# Patient Record
Sex: Female | Born: 1965 | Race: Black or African American | Hispanic: No | Marital: Single | Smoking: Never smoker
Health system: Southern US, Community
[De-identification: ages and names within clinical notes are randomized; demographics above are authoritative.]

## PROBLEM LIST (undated history)

## (undated) DIAGNOSIS — U071 COVID-19: Secondary | ICD-10-CM

## (undated) DIAGNOSIS — G4486 Cervicogenic headache: Secondary | ICD-10-CM

## (undated) DIAGNOSIS — K219 Gastro-esophageal reflux disease without esophagitis: Secondary | ICD-10-CM

## (undated) DIAGNOSIS — R600 Localized edema: Secondary | ICD-10-CM

## (undated) DIAGNOSIS — E049 Nontoxic goiter, unspecified: Secondary | ICD-10-CM

## (undated) DIAGNOSIS — E559 Vitamin D deficiency, unspecified: Secondary | ICD-10-CM

## (undated) DIAGNOSIS — D250 Submucous leiomyoma of uterus: Secondary | ICD-10-CM

## (undated) DIAGNOSIS — B001 Herpesviral vesicular dermatitis: Secondary | ICD-10-CM

## (undated) DIAGNOSIS — N8501 Benign endometrial hyperplasia: Secondary | ICD-10-CM

## (undated) DIAGNOSIS — E039 Hypothyroidism, unspecified: Secondary | ICD-10-CM

## (undated) DIAGNOSIS — R700 Elevated erythrocyte sedimentation rate: Secondary | ICD-10-CM

## (undated) DIAGNOSIS — N809 Endometriosis, unspecified: Secondary | ICD-10-CM

## (undated) DIAGNOSIS — R638 Other symptoms and signs concerning food and fluid intake: Secondary | ICD-10-CM

## (undated) DIAGNOSIS — N83201 Unspecified ovarian cyst, right side: Secondary | ICD-10-CM

## (undated) DIAGNOSIS — E7800 Pure hypercholesterolemia, unspecified: Secondary | ICD-10-CM

## (undated) DIAGNOSIS — M1990 Unspecified osteoarthritis, unspecified site: Secondary | ICD-10-CM

## (undated) DIAGNOSIS — I10 Essential (primary) hypertension: Secondary | ICD-10-CM

## (undated) DIAGNOSIS — G932 Benign intracranial hypertension: Secondary | ICD-10-CM

## (undated) DIAGNOSIS — E890 Postprocedural hypothyroidism: Secondary | ICD-10-CM

## (undated) DIAGNOSIS — J019 Acute sinusitis, unspecified: Secondary | ICD-10-CM

## (undated) DIAGNOSIS — Z8742 Personal history of other diseases of the female genital tract: Secondary | ICD-10-CM

## (undated) DIAGNOSIS — D649 Anemia, unspecified: Secondary | ICD-10-CM

## (undated) DIAGNOSIS — IMO0002 Reserved for concepts with insufficient information to code with codable children: Secondary | ICD-10-CM

## (undated) DIAGNOSIS — L709 Acne, unspecified: Secondary | ICD-10-CM

## (undated) HISTORY — DX: Pure hypercholesterolemia, unspecified: E78.00

## (undated) HISTORY — DX: Postprocedural hypothyroidism: E89.0

## (undated) HISTORY — DX: Cervicogenic headache: G44.86

## (undated) HISTORY — DX: Personal history of other diseases of the female genital tract: Z87.42

## (undated) HISTORY — DX: Gastro-esophageal reflux disease without esophagitis: K21.9

## (undated) HISTORY — DX: Acne, unspecified: L70.9

## (undated) HISTORY — DX: COVID-19: U07.1

## (undated) HISTORY — DX: Other symptoms and signs concerning food and fluid intake: R63.8

## (undated) HISTORY — DX: Unspecified osteoarthritis, unspecified site: M19.90

## (undated) HISTORY — PX: TUBAL LIGATION: SHX77

## (undated) HISTORY — DX: Morbid (severe) obesity due to excess calories: E66.01

## (undated) HISTORY — PX: THYROID SURGERY: SHX805

## (undated) HISTORY — DX: Benign endometrial hyperplasia: N85.01

## (undated) HISTORY — DX: Localized edema: R60.0

## (undated) HISTORY — PX: OVARY SURGERY: SHX727

## (undated) HISTORY — DX: Elevated erythrocyte sedimentation rate: R70.0

## (undated) HISTORY — DX: Acute sinusitis, unspecified: J01.90

## (undated) HISTORY — DX: Endometriosis, unspecified: N80.9

## (undated) HISTORY — DX: Nontoxic goiter, unspecified: E04.9

## (undated) HISTORY — DX: Reserved for concepts with insufficient information to code with codable children: IMO0002

## (undated) HISTORY — DX: Unspecified ovarian cyst, right side: N83.201

## (undated) HISTORY — DX: Vitamin D deficiency, unspecified: E55.9

## (undated) HISTORY — DX: Benign intracranial hypertension: G93.2

## (undated) HISTORY — DX: Herpesviral vesicular dermatitis: B00.1

## (undated) HISTORY — DX: Submucous leiomyoma of uterus: D25.0

---

## 2001-06-20 ENCOUNTER — Ambulatory Visit (HOSPITAL_COMMUNITY): Admission: RE | Admit: 2001-06-20 | Discharge: 2001-06-20 | Payer: Self-pay | Admitting: Family Medicine

## 2001-06-20 ENCOUNTER — Encounter: Payer: Self-pay | Admitting: Family Medicine

## 2001-07-16 ENCOUNTER — Encounter: Admission: RE | Admit: 2001-07-16 | Discharge: 2001-07-16 | Payer: Self-pay | Admitting: Surgery

## 2001-07-16 ENCOUNTER — Encounter: Payer: Self-pay | Admitting: Surgery

## 2001-11-02 ENCOUNTER — Encounter: Payer: Self-pay | Admitting: Surgery

## 2001-11-06 ENCOUNTER — Ambulatory Visit (HOSPITAL_COMMUNITY): Admission: RE | Admit: 2001-11-06 | Discharge: 2001-11-08 | Payer: Self-pay | Admitting: Surgery

## 2001-11-06 ENCOUNTER — Encounter (INDEPENDENT_AMBULATORY_CARE_PROVIDER_SITE_OTHER): Payer: Self-pay | Admitting: *Deleted

## 2003-02-21 DIAGNOSIS — R87619 Unspecified abnormal cytological findings in specimens from cervix uteri: Secondary | ICD-10-CM

## 2003-02-21 DIAGNOSIS — IMO0002 Reserved for concepts with insufficient information to code with codable children: Secondary | ICD-10-CM

## 2003-02-21 HISTORY — DX: Unspecified abnormal cytological findings in specimens from cervix uteri: R87.619

## 2003-02-21 HISTORY — DX: Reserved for concepts with insufficient information to code with codable children: IMO0002

## 2003-04-07 ENCOUNTER — Other Ambulatory Visit: Admission: RE | Admit: 2003-04-07 | Discharge: 2003-04-07 | Payer: Self-pay | Admitting: Family Medicine

## 2003-04-09 ENCOUNTER — Ambulatory Visit (HOSPITAL_COMMUNITY): Admission: RE | Admit: 2003-04-09 | Discharge: 2003-04-09 | Payer: Self-pay | Admitting: Family Medicine

## 2003-11-17 ENCOUNTER — Ambulatory Visit (HOSPITAL_COMMUNITY): Admission: RE | Admit: 2003-11-17 | Discharge: 2003-11-17 | Payer: Self-pay | Admitting: Family Medicine

## 2004-02-21 DIAGNOSIS — D250 Submucous leiomyoma of uterus: Secondary | ICD-10-CM

## 2004-02-21 DIAGNOSIS — Z8742 Personal history of other diseases of the female genital tract: Secondary | ICD-10-CM

## 2004-02-21 DIAGNOSIS — N809 Endometriosis, unspecified: Secondary | ICD-10-CM

## 2004-02-21 DIAGNOSIS — N8501 Benign endometrial hyperplasia: Secondary | ICD-10-CM

## 2004-02-21 HISTORY — DX: Benign endometrial hyperplasia: N85.01

## 2004-02-21 HISTORY — DX: Submucous leiomyoma of uterus: D25.0

## 2004-02-21 HISTORY — DX: Personal history of other diseases of the female genital tract: Z87.42

## 2004-02-21 HISTORY — DX: Endometriosis, unspecified: N80.9

## 2004-03-04 DIAGNOSIS — N803 Endometriosis of pelvic peritoneum, unspecified: Secondary | ICD-10-CM | POA: Insufficient documentation

## 2004-03-19 ENCOUNTER — Encounter (INDEPENDENT_AMBULATORY_CARE_PROVIDER_SITE_OTHER): Payer: Self-pay | Admitting: *Deleted

## 2004-03-19 ENCOUNTER — Inpatient Hospital Stay (HOSPITAL_COMMUNITY): Admission: RE | Admit: 2004-03-19 | Discharge: 2004-03-21 | Payer: Self-pay | Admitting: Obstetrics and Gynecology

## 2004-03-19 DIAGNOSIS — N83201 Unspecified ovarian cyst, right side: Secondary | ICD-10-CM

## 2004-03-19 HISTORY — PX: DILATION AND CURETTAGE OF UTERUS: SHX78

## 2004-03-19 HISTORY — PX: OTHER SURGICAL HISTORY: SHX169

## 2004-03-19 HISTORY — DX: Unspecified ovarian cyst, right side: N83.201

## 2004-07-12 ENCOUNTER — Other Ambulatory Visit: Admission: RE | Admit: 2004-07-12 | Discharge: 2004-07-12 | Payer: Self-pay | Admitting: Obstetrics and Gynecology

## 2009-02-04 ENCOUNTER — Emergency Department (HOSPITAL_COMMUNITY): Admission: EM | Admit: 2009-02-04 | Discharge: 2009-02-04 | Payer: Self-pay | Admitting: Emergency Medicine

## 2010-06-13 ENCOUNTER — Encounter: Payer: Self-pay | Admitting: Internal Medicine

## 2010-08-27 LAB — CBC
HCT: 33 % — ABNORMAL LOW (ref 36.0–46.0)
MCHC: 32.1 g/dL (ref 30.0–36.0)
MCV: 78.2 fL (ref 78.0–100.0)
Platelets: 444 10*3/uL — ABNORMAL HIGH (ref 150–400)
WBC: 6.8 10*3/uL (ref 4.0–10.5)

## 2010-08-27 LAB — URINALYSIS, ROUTINE W REFLEX MICROSCOPIC
Glucose, UA: NEGATIVE mg/dL
Protein, ur: NEGATIVE mg/dL
Urobilinogen, UA: 1 mg/dL (ref 0.0–1.0)

## 2010-08-27 LAB — POCT I-STAT, CHEM 8
Chloride: 102 mEq/L (ref 96–112)
Creatinine, Ser: 1.1 mg/dL (ref 0.4–1.2)
Glucose, Bld: 88 mg/dL (ref 70–99)
Hemoglobin: 12.2 g/dL (ref 12.0–15.0)
Potassium: 3.7 mEq/L (ref 3.5–5.1)

## 2010-08-27 LAB — DIFFERENTIAL
Eosinophils Absolute: 0.5 10*3/uL (ref 0.0–0.7)
Eosinophils Relative: 8 % — ABNORMAL HIGH (ref 0–5)
Lymphs Abs: 1.6 10*3/uL (ref 0.7–4.0)
Monocytes Absolute: 0.6 10*3/uL (ref 0.1–1.0)

## 2010-08-27 LAB — WET PREP, GENITAL
Clue Cells Wet Prep HPF POC: NONE SEEN
Trich, Wet Prep: NONE SEEN
WBC, Wet Prep HPF POC: NONE SEEN

## 2010-08-27 LAB — URINE MICROSCOPIC-ADD ON

## 2010-08-27 LAB — URINE CULTURE: Culture: NO GROWTH

## 2010-09-16 ENCOUNTER — Other Ambulatory Visit: Payer: Self-pay | Admitting: Family Medicine

## 2010-09-16 ENCOUNTER — Ambulatory Visit
Admission: RE | Admit: 2010-09-16 | Discharge: 2010-09-16 | Disposition: A | Discharge status: Home / Self Care | Payer: PRIVATE HEALTH INSURANCE | Source: Ambulatory Visit | Attending: Family Medicine | Admitting: Family Medicine

## 2010-09-16 DIAGNOSIS — N920 Excessive and frequent menstruation with regular cycle: Secondary | ICD-10-CM

## 2010-10-08 NOTE — Discharge Summary (Signed)
[place]  Patient:    Teresa Kent, BETTENHAUSEN Visit Number: [id number] MRN: [id number]          Service Type: DSU Location: [phone] Attending Physician:  Bonnetta Barry Dictated by:   Velora Heckler, M.D. Admit Date:  11/06/2001 Discharge Date: 11/08/2001   CC:         Stacie Acres. Cliffton Asters, M.D.   Discharge Summary  REASON FOR ADMISSION:  Thyroid goiter.  BRIEF HISTORY:  The patient is a 45 year old black female who has had a developing thyroid goiter over the past several years.  She has developed intermittent hoarseness and compressive symptoms.  She now comes to surgery for thyroidectomy.  HOSPITAL COURSE:  The patient was admitted on the morning of November 06, 2001. She was taken directly to the operating room where she underwent total thyroidectomy for a very large thyroid goiter.  Postoperatively, the patient did well.  She developed mild hypocalcemia requiring oral replenishment. A Jackson-Pratt drain was left in place due to the large size of the goiter. This was removed on the second postoperative day.  The patient was prepared for discharge on the second postoperative day.  PLAN:  The patient is discharged home today, November 08, 2001, in good condition, tolerating a regular diet, and ambulating independently.  She will be seen back in my office at [facility] in two weeks.  DISCHARGE MEDICATIONS: 1. Vicodin as needed for pain. 2. Iron sulfate 325 mg twice daily. 3. Synthroid 0.125 mg daily. 4. Calcium carbonate 1000 mg three times daily.  FINAL DIAGNOSES:  Thyroid goiter with compressive symptoms.  CONDITION ON DISCHARGE:  Improved. Dictated by:   Velora Heckler, M.D. Attending Physician:  Bonnetta Barry DD:  11/08/01 TD:  11/09/01 Job: [id number]

## 2010-10-08 NOTE — H&P (Signed)
Teresa Kent, Teresa Kent            ACCOUNT NO.:  [id number]   MEDICAL RECORD NO.:  [id number]          PATIENT TYPE:  INP   LOCATION:  9399                          FACILITY:  [facility]   PHYSICIAN:  Osborn Coho, M.D.   DATE OF BIRTH:  06/15/1965   DATE OF ADMISSION:  03/19/2004  DATE OF DISCHARGE:                                HISTORY & PHYSICAL   CHIEF COMPLAINT:  Heavy menstrual cycle x at least one year and persistent  right ovarian cyst since November 2004.   HISTORY:  Teresa Kent is a 45 year old, gravida 1, para 1-0-0-1 with last  menstrual period October with menorrhagia for greater than one year and  persistent right ovarian cyst since November of 2004.   PAST OB HISTORY:  Normal spontaneous vaginal delivery fullterm x1.   PAST GYN HISTORY:  History of regular menses, no other history of gonorrhea  or chlamydia and reports a positive history of abnormal Pap with her last  Pap in November of 2004 showing ASCUS and negative for high risk HPV.  Diagnosed with cyst and fibroid versus polyp about one year ago.   PAST MEDICAL HISTORY:  1.  Hypertension.  2.  Hypothyroidism status post thyroidectomy.   PAST SURGICAL HISTORY:  Thyroidectomy and bilateral tubal ligation.   MEDICINES:  1.  Synthroid 200 mcg q.d.  2.  Maxzide 25 mg q.d.   ALLERGIES:  No known drug allergies.   SOCIAL HISTORY:  Denies cigarette use, alcohol use, drug use and lives with  her son.   FAMILY HISTORY:  Hypertension, diabetes, and no GYN cancers.   REVIEW OF SYMPTOMS:  Denies chest pain, shortness of breath, fever, chills,  nausea, vomiting, diarrhea or GU problems.  Does report constipation when  started Synthroid.   PHYSICAL EXAMINATION:  HEENT:  Within normal limits.  Status post thyroid  surgery.  HEART:  Rate and rhythm are regular.  CHEST:  Clear to auscultation bilaterally.  BREASTS:  Deferred.  BACK:  No CVA tenderness.  ABDOMEN:  Obese, soft, nontender.  EXTREMITIES:  Within normal  limits.  PELVIC:  External genitalia within normal limits, vagina within normal  limits. Difficult to palpate uterus or bilateral adnexa secondary to body  habitus.   IMAGING:  Ultrasound revealing persistent 3 cm right ovarian cyst and  probable 1 cm fibroid.   ASSESSMENT:  Teresa Kent is a 45 year old, para 1 with menorrhagia most  likely secondary to submucosal fibroid. She also has persistent right  ovarian cyst.  Options were discussed with the patient. The patient wanted  to undergo hysteroscopy for removal of the submucosal fibroid and  laparoscopy for removal of the right ovarian cyst.  The risks, benefits, and  alternatives were discussed with the patient including but not limited to  bleeding, infection, injury, and possible laparotomy with possible  oophorectomy.  Consent signed and witnessed and preop labs done.     Ange   AR/MEDQ  D:  03/19/2004  T:  03/19/2004  Job:  [id number]

## 2010-10-08 NOTE — Op Note (Signed)
NAMEJAANAI, SALEMI            ACCOUNT NO.:  [id number]   MEDICAL RECORD NO.:  [id number]          PATIENT TYPE:  INP   LOCATION:  9310                          [facility]ACILITY[facility]  [facility]   PHYSICIAN:  Osborn Coho, M.D.   DATE OF BIRTH:  06-Jan-1966   DATE OF PROCEDURE:  03/19/2004  DATE OF DISCHARGE:                                 OPERATIVE REPORT   PREOPERATIVE DIAGNOSES:  1.  Menorrhagia.  2.  Submucosal fibroid.  3.  Persistent right ovarian cyst.   POSTOPERATIVE DIAGNOSES:  1.  Menorrhagia.  2.  Submucosal fibroid.  3.  Persistent right ovarian cyst.  4.  Probable endometrioma.   PROCEDURE:  1.  Hysteroscopy.  2.  Resection of fibroid.  3.  D&C.  4.  Diagnostic laparoscopy.  5.  Exploratory laparotomy.  6.  Right salpingo-oophorectomy.  7.  Lysis of adhesions.   ANESTHESIA:  General.   ATTENDING PHYSICIAN:  Osborn Coho, M.D.   ASSISTANT:  Naima A. Dillard, M.D.   FLUIDS:  2900 mL.   ESTIMATED BLOOD LOSS:  300 mL.   URINE OUTPUT:  300 mL.   COMPLICATIONS:  None.   FINDINGS:  An approximately 1 cm submucosal fibroid and possibly a polyp as  well. An approximately 3 cm right ovarian cyst probable endometrioma.  Many  very dense adhesions of the left and right ovary to each other and to the  posterior wall of the uterus as well as to bowel.   PATHOLOGY:  Right ovary and fallopian tube.  Endometrial curettings and  portions of resected fibroid.   DESCRIPTION OF PROCEDURE:  The patient was taken to the operating room after  the risks, benefits, and alternatives were discussed with the patient. The  patient verbalized an understanding and consent signed and witnessed. The  patient was prepped and draped in the normal sterile fashion after being  placed under general anesthesia.  A speculum was placed in the patient's  vagina and the cervix dilated for the passage of the diagnostic  hysteroscope. The diagnostic hysteroscope was introduced and an  approximately 1  cm submucosal fibroid noted as well as possibly a polyp.  The cervix was then dilated for passage of the resectoscope. The  resectoscope was introduced and submucosal fibroid resected as well as  polyp.  Curettage was performed.  Prior to doing the diagnostic  hysteroscope, the uterus was sounded to approximately 9-10 cm.  After  curettage, the instruments were removed and a Hulka placed for intrauterine  manipulation for the laparoscopic portion of the case. The tenaculum site  were hemostatic. After regowning and gloving, attention was turned to the  abdomen where a 10 mm incision was made at the umbilicus. A Veress needle  was introduced into the intraabdominal cavity and pneumoperitoneum achieved.  A 10 mm trocar was then advanced into the intraabdominal cavity and some  omental adhesions were noted at the sites of the umbilical incision. No  bowel was noted to be adherent to the abdominal wall in addition to that  omentum.  The patient was placed in Trendelenburg and attention was turned  to the suprapubic  region where a 5 mm incision was made. Prior to making the  incision at the umbilicus or at the suprapubic region, Marcaine was  injected.  The 5 mm incision was made suprapubically and the trocar advanced  under direct visualization.  A blunt probe was used for manipulation of the  bowel which was noted to be densely adherent to the bilateral ovaries and  posterior wall of the uterus. The decision was made to convert to laparotomy  at this time.  Antibiotics were ordered in the form of 2 g of cefoxitin. A  Pfannenstiel skin incision was made and carried down to the underlying layer  of fascia with the Bovie. The fascia was excised bilaterally in the midline  with the Bovie and the incision extended bilaterally with the Bovie after  being tinted up with large Kelly's.  Kocher clamps were placed on the  superior aspect of the fascial incision and the rectus muscle excised from  the  fascia.  The same was done on the inferior aspect of the fascial  incision. The muscle was separated in the midline and the peritoneum was  entered bluntly in patient.  The peritoneal incision was extended with  Metzenbaum scissors.  The bowel was packed away and the [facility] was placed  for self retraction. The extender was used to hold the bowel.  A bladder  blade was also placed. Exposure was very difficult as the pelvis was deep.  Adhesions of the bowel to the bilateral ovaries and posterior uterine wall  were taken down sharply with the Metzenbaum scissors as well as bluntly.  Once the right ovary was freed, the IP was identified and the ureter on that  side was identified as well and noted to peristalse normally. The cyst was  punctured and chocolate fluid was released suggestive of an endometrioma.  The IP was then clamped with a large Kelly. The round ligament was clamped  on the right side and stitched.  The Bovie was used to cauterize the round  ligament and a window was then created for clamping the uteroovarian  pedicle. The uteroovarian pedicle was then clamped with two large Kelly's,  excised and ligated with #0 Vicryl and then suture ligated with #0 Vicryl  with a second stitch.  The large Tresa Endo was then used to clamp the  infundibulopelvic ligament after noting the ureter was away from this area  and the ovary was excised and sent off to pathology.  The pedicle was then  tied with a free tie of #0 Vicryl and suture ligated with #0 Vicryl.  The  remaining portion of the right fallopian tube was also clamped, excised and  ligated using a free tie of #0 Vicryl and then suture ligated.  Hemostasis  of the pedicle was noted. The intraabdominal cavity was copiously irrigated  and all pedicles were noted to be hemostatic. Intercede was placed in the  posterior cul-de-sac. Surgicel was placed on the right mesosalpinx.  The left ovary was still slightly adherent to the posterior  uterine wall but  appeared otherwise within normal limits.  All sponges were removed. The 10  mm umbilical incision was repaired using a pursestring suture of #0 Vicryl.  3-0 Monocryl was placed on the skin.  The peritoneum was then closed with 2-  0 chromic in a running fashion.  The fascia was repaired with #0 Vicryl in a  running fashion. The subcutaneous tissue was copiously irrigated and the  subcutaneous tissue was reapproximated  with  2-0 plain using three interrupted's. The skin was closed with 3-0  Monocryl using a subcuticular stitch. Sponge, lap and needle count was  correct. The patient tolerated the procedure well and was returned to the  recovery room in good condition.     Ange   AR/MEDQ  D:  03/19/2004  T:  03/19/2004  Job:  [id number]

## 2010-10-08 NOTE — Op Note (Signed)
. [facility]  Patient:    Teresa Kent, Teresa Kent Visit Number: [id number] MRN: [id number]          Service Type: DSU Location: [phone] Attending Physician:  Bonnetta Barry Dictated by:   Velora Heckler, M.D. Proc. Date: 11/06/01 Admit Date:  11/06/2001   CC:         Stacie Acres. Cliffton Asters, M.D.   Operative Report  PREOPERATIVE DIAGNOSIS:  Thyroid goiter.  POSTOPERATIVE DIAGNOSIS:  Thyroid goiter.  PROCEDURE:  Total thyroidectomy.  SURGEON:  Velora Heckler, M.D.  ASSISTANT:  Adolph Pollack, M.D.  ANESTHESIA:  Judie Petit, M.D. for general anesthesia.  ESTIMATED BLOOD LOSS:  750 cc.  PREPARATION:  Betadine.  COMPLICATIONS:  None.  INDICATIONS:  The patient is a 46 year old black female who presents with longstanding thyroid goiter.  This has been present for many years, but has become markedly increased in size and more physically prominent over the past two years.  She does note intermittent hoarseness.  She has frequent dysphasia. She does have dyspnea.  She is unable to sleep lying on her back. She gives a history of tightness in the throat.  The patient underwent ultrasound and CT scan of the neck.  She now comes to surgery for thyroidectomy.  DESCRIPTION OF PROCEDURE:  The procedure is done in OR #17 at the [facility].  The patient is brought to the operating room and placed in the supine position on the operating table.  Following administration of general anesthesia, the patient is prepped and draped in the usual strict aseptic fashion.  After ascertaining that an adequate level of anesthesia had been obtained, a Kocher incision was made with a #10 blade.  Dissection was carried down through the subcutaneous tissues and platysma.  Hemostasis is obtained with electrocautery.  Skin flaps are developed cephalad and caudad from the thyroid notch to the sternal notch.  A [place]  self-retaining retractory is placed for exposure.  Dissection is begun in the midline.  Strap muscles are incised and reflected laterally.  The gland is exceedingly large measuring probably 20 cm in transverse dimension.  There is considerable anterior extension of the gland making dissection laterally and posteriorly quite difficult.  Due to the overall size of the gland, this case was extended by at least one hour in operative time.  Also the increased level of difficulty required two surgeons and the assistance of a PA student as well as two scrub techs.  Significant increased blood loss was due to venous hypertension within the gland.  Overall the difficulty of this case was markedly increased due to the size of the goiter.  Dissection was begun on the left side by reflecting the strap muscles laterlaly.  Using blunt dissection, the thyroid lobe was mobilized.  Large venous tributaries were divided between medium ligaclips and also ligated in continuity with 2-0 silk ties.  Dissection was carried cephalad.  Strap muscles were reflected laterally and superiorly.  The superior pole of the gland was quite large and extended posteriorly behind the larynx.  With blunt dissection it was slowly mobilized and superior pole vessels were visualized. Vessels were ligated in continuity with 2-0 silk ties and doubly secured with medium ligaclips.  Vessels were then divided.  A second group of vessels from the posterior aspect of the superior pole were also ligated in continuity and divided after application of ligaclips.  The gland was rolled anteriorly. Middle thyroid vein was ligated in  continuity with 3-0 silk ties and divided. The gland was rolled further medially.  Care was taken to preserve the area of the recurrent laryngeal nerve and parathyroid glands, however, these structures were not positively identified on the left side of the neck. The inferior pole venous tributaries were ligated  with 2-0 silk ties and divided. Disruption of the inferior pole capsule lead to significant bleeding from venous hypertension within the gland.  This was controlled mainly by direct pressure and with 2-0 silk suture ligatures.  The gland was rolled further medially.  The tubercle of Zucker candle was identified.  It was intimately approximated with the branches of the inferior thyroid artery, the recurrent laryngeal nerve, and suspected parathyroid tissue.  Therefore, the small remnant of thyroid tissue was left in place on the left side by dividing across the thyroid parenchyma with the electrocautery so as to prevent injury to the recurrent laryngeal nerve.  Gland is mobilized anteriorly up and onto the surface of the trachea.  Tributaries coming in to the superior aspect of the thyroid isthmus are ligated between small ligaclips and divided.  Next, we proceeded with the right side.  The right lobe of the gland was actually larger than the left.  The strap muscles were again reflected laterally. Using blunt dissection to mobilize the lobe.  Inferior pole venous tributaries were again divided by ligating in continuity with 2-0 silk ties and using medium ligaclips.  After rolling the gland anteriorly, the superior pole was mobilized.  Again vessels were ligated in continuity with 2-0 silk ties and medium ligaclips and divided.  The parathyroid glands on the right side were both positively identified as was the recurrent laryngeal nerve.  Branches of the inferior thyroid artery area divided between small ligaclips.  The main trunk of the inferior thyroid artery was ligated just above the recurrent laryngeal nerve with a 2-0 silk figure-of-eight suture ligature.  The thyroid gland and tubercle of Zucker candle were then carefully excised off the anterior surface of the trachea.  The entire gland is excised and the left superior pole marked with a suture for orientation purposes.  The gland  is markedly enlarged, although, appears homogeneous and was submitted fresh to  pathology for review.  The neck was irrigated copiously with warm saline on both sides.  Hemostasis was obtained with the electrocautery as well as small ligaclips.  Good hemostasis was noted.  Surgicel was placed over the area of the recurrent laryngeal nerves bilaterally.  A 7 mm Jackson-Pratt drain is brought in from the left lateral stab wound and placed into the thyroid bed. Strap muscles are reapproximated in the midline with interrupted 3-0 Vicryl sutures.  Drain is secured to the skin with a 4-0 nylon suture.  Platysma is reapproximated with interrupted 3-0 Vicryl sutures.  Skin edges are reapproximated with widely spaced stainless steel staples and interspaced half inch Steri-Strips and Benzoin.  Sterile gauze dressings are applied.  Drain is placed to bulb suction.  The patient is awakened from anesthesia and brought to the recovery room in stable condition.  The patient tolerated the procedure well. Dictated by:   Velora Heckler, M.D. Attending Physician:  Bonnetta Barry DD:  11/06/01 TD:  11/07/01 Job: [id number]

## 2010-10-08 NOTE — Discharge Summary (Signed)
Teresa Teresa Kent Teresa Kent, Teresa Teresa Kent Teresa Kent Teresa Kent            ACCOUNT NO.:  [id number]   MEDICAL RECORD NO.:  [id number]          PATIENT TYPE:  INP   LOCATION:  9310                          FACILITY:  [facility]   PHYSICIAN:  Osborn Coho, M.D.   DATE OF BIRTH:  [birth year]   DATE OF ADMISSION:  03/19/2004  DATE OF DISCHARGE:  03/21/2004                                 DISCHARGE SUMMARY   DISCHARGE DIAGNOSES:  1.  Menorrhagia.  2.  Submucosal fibroid.  3.  Persistent right ovarian cyst.  4.  Endometriosis.  5.  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   OPERATION:  On the date of admission, the patient underwent a hysteroscopy  with D&C, resection of fibroid, diagnostic laparoscopy which converted to an  exploratory laparotomy with a right salpingo-oophorectomy and lysis of  adhesions, tolerating all procedures well.  The patient was found to have an  approximately 1 cm submucosal fibroid and polyp in the endometrial cavity,  an approximately 3 cm right ovarian cyst with characteristics of an  endometrioma, along with very dense adhesions of the left and right ovary to  each other and to the posterior wall of the uterus and bowel.   HISTORY OF PRESENT ILLNESS:  Teresa Teresa Kent Teresa Kent is a 45 year old para 1-0-0-1 who  presents for hysteroscopy with resection of a submucosal fibroid and  laparoscopic removal of a persistent right ovarian cyst.  Please see the  patient's dictated History and Physical Examination for details.   PREOPERATIVE PHYSICAL EXAMINATION:  GENERAL:  Within normal limits.  PELVIC:  External genitalia within normal limits.  The vagina within normal  limits.  It was difficult to palpate uterus or adnexal areas due to the  patient's body habitus.   HOSPITAL COURSE:  On the date of admission, the patient underwent  aforementioned procedures, tolerating them all well.  Postoperative course  was unremarkable, with the patient resuming bowel and bladder function by  postoperative day #2 and therefore deemed  ready for discharge home.  Postoperative hemoglobin was 10.4 (preoperative hemoglobin 12.4).   DISCHARGE MEDICATIONS:  1.  Motrin 600 mg one tablet q.6h. as needed for pain (to be taken with      food).  2.  Percocet 5/325 one to two tablets q.4-6h. as needed for breakthrough      pain.  3.  Phenergan 12.5 mg one to two tablets q.6h. as needed for nausea.  4.  Colace one to two tablets daily as needed.  5.  Glycerin suppositories as needed for constipation.   FOLLOW-UP:  The patient is to call [facility] at [phone] to  schedule a 6 weeks postoperative visit with Dr. Su Hilt.   DISCHARGE INSTRUCTIONS:  The patient was given a copy of [facility] postoperative instruction sheet.  She was further advised to call the  doctor with any severe abdominal pain, nausea, vomiting, or temperature  greater than 100.4 degrees Fahrenheit orally.  The patient's activity is to  be as tolerated.  Her diet is without restriction.   FINAL PATHOLOGY:  Curettage, endometrium:  Simple hyperplasia without  atypia.  Removal of uterine fibroid and polyp.  Fragments of endometrial  polyp, hyperplastic type.  Salpingectomy and oophorectomy, right ovary and  fallopian tube:  Fallopian tube with simple paratubal cyst, hemorrhagic  corpus luteum, endometriotic cyst, and cystic follicles.     Elmi   EJP/MEDQ  D:  04/07/2004  T:  04/07/2004  Job:  [id number]

## 2010-10-11 ENCOUNTER — Other Ambulatory Visit: Payer: Self-pay | Admitting: Obstetrics and Gynecology

## 2010-10-11 DIAGNOSIS — Z1231 Encounter for screening mammogram for malignant neoplasm of breast: Secondary | ICD-10-CM

## 2010-10-11 DIAGNOSIS — R638 Other symptoms and signs concerning food and fluid intake: Secondary | ICD-10-CM

## 2010-10-11 HISTORY — DX: Other symptoms and signs concerning food and fluid intake: R63.8

## 2010-10-15 ENCOUNTER — Ambulatory Visit
Admission: RE | Admit: 2010-10-15 | Discharge: 2010-10-15 | Disposition: A | Discharge status: Home / Self Care | Payer: PRIVATE HEALTH INSURANCE | Source: Ambulatory Visit | Attending: Obstetrics and Gynecology | Admitting: Obstetrics and Gynecology

## 2010-10-15 DIAGNOSIS — Z1231 Encounter for screening mammogram for malignant neoplasm of breast: Secondary | ICD-10-CM

## 2010-12-09 ENCOUNTER — Other Ambulatory Visit: Payer: Self-pay | Admitting: Obstetrics and Gynecology

## 2010-12-10 ENCOUNTER — Other Ambulatory Visit: Payer: Self-pay | Admitting: Obstetrics and Gynecology

## 2010-12-17 ENCOUNTER — Other Ambulatory Visit: Payer: Self-pay | Admitting: Obstetrics and Gynecology

## 2010-12-31 ENCOUNTER — Ambulatory Visit (HOSPITAL_COMMUNITY): Admission: RE | Admit: 2010-12-31 | Payer: Self-pay | Source: Ambulatory Visit | Admitting: Obstetrics and Gynecology

## 2010-12-31 ENCOUNTER — Encounter (HOSPITAL_COMMUNITY): Admission: RE | Payer: Self-pay | Source: Ambulatory Visit

## 2010-12-31 SURGERY — DILATATION & CURETTAGE/HYSTEROSCOPY WITH NOVASURE ABLATION
Anesthesia: General

## 2011-02-21 ENCOUNTER — Other Ambulatory Visit: Payer: Self-pay | Admitting: Obstetrics and Gynecology

## 2011-02-24 ENCOUNTER — Inpatient Hospital Stay (HOSPITAL_COMMUNITY): Admission: RE | Admit: 2011-02-24 | Payer: Self-pay | Source: Ambulatory Visit

## 2011-02-25 ENCOUNTER — Other Ambulatory Visit: Payer: Self-pay | Admitting: Obstetrics and Gynecology

## 2011-03-04 ENCOUNTER — Other Ambulatory Visit: Payer: Self-pay

## 2011-03-04 ENCOUNTER — Encounter (HOSPITAL_COMMUNITY)
Admission: RE | Admit: 2011-03-04 | Discharge: 2011-03-04 | Disposition: A | Discharge status: Home / Self Care | Payer: BC Managed Care – PPO | Source: Ambulatory Visit | Attending: Obstetrics and Gynecology | Admitting: Obstetrics and Gynecology

## 2011-03-04 ENCOUNTER — Encounter (HOSPITAL_COMMUNITY): Payer: Self-pay

## 2011-03-04 DIAGNOSIS — Z01812 Encounter for preprocedural laboratory examination: Secondary | ICD-10-CM | POA: Insufficient documentation

## 2011-03-04 DIAGNOSIS — Z01810 Encounter for preprocedural cardiovascular examination: Secondary | ICD-10-CM | POA: Insufficient documentation

## 2011-03-04 HISTORY — DX: Hypothyroidism, unspecified: E03.9

## 2011-03-04 HISTORY — DX: Essential (primary) hypertension: I10

## 2011-03-04 LAB — BASIC METABOLIC PANEL
BUN: 13 mg/dL (ref 6–23)
CO2: 29 mEq/L (ref 19–32)
Chloride: 101 mEq/L (ref 96–112)
Creatinine, Ser: 0.76 mg/dL (ref 0.50–1.10)

## 2011-03-04 LAB — CBC
HCT: 37.1 % (ref 36.0–46.0)
Hemoglobin: 11.6 g/dL — ABNORMAL LOW (ref 12.0–15.0)
MCH: 26.5 pg (ref 26.0–34.0)
MCHC: 31.3 g/dL (ref 30.0–36.0)
MCV: 84.7 fL (ref 78.0–100.0)

## 2011-03-04 SURGERY — Surgical Case
Anesthesia: *Unknown

## 2011-03-04 NOTE — Patient Instructions (Signed)
   Your procedure is scheduled on:  Enter through the Main Entrance of [facility] at: Pick up the phone at the desk and dial [phone] and inform us of your arrival.  Please call this number if you have any problems the morning of surgery: [phone]  Remember: Do not eat food after midnight:Thursday  Do not drink clear liquids after:midnight Take these medicines the morning of surgery with a SIP OF WATER: none  Do not wear jewelry, make-up, or FINGER nail polish Do not wear lotions, powders, or perfumes.  You may wear deodorant. Do not shave 48 hours prior to surgery. Do not bring valuables to the hospital.  Leave suitcase in the car. After Surgery it may be brought to your room. For patients being admitted to the hospital, checkout time is 11:00am the day of discharge.  Patients discharged on the day of surgery will not be allowed to drive home.   Name and phone number of your driver:Evone- [phone] or [phone]  Remember to use your hibiclens as instructed.Please shower with 1/2 bottle the evening before your surgery and the other 1/2 bottle the morning of surgery.

## 2011-03-07 ENCOUNTER — Other Ambulatory Visit: Payer: Self-pay | Admitting: Obstetrics and Gynecology

## 2011-04-22 ENCOUNTER — Encounter (HOSPITAL_COMMUNITY): Admission: RE | Payer: Self-pay | Source: Ambulatory Visit

## 2011-04-22 ENCOUNTER — Ambulatory Visit (HOSPITAL_COMMUNITY)
Admission: RE | Admit: 2011-04-22 | Payer: BC Managed Care – PPO | Source: Ambulatory Visit | Admitting: Obstetrics and Gynecology

## 2011-04-22 SURGERY — DILATATION & CURETTAGE/HYSTEROSCOPY WITH HYDROTHERMAL ABLATION
Anesthesia: General

## 2011-09-21 ENCOUNTER — Other Ambulatory Visit: Payer: Self-pay | Admitting: Obstetrics and Gynecology

## 2011-09-21 DIAGNOSIS — Z1231 Encounter for screening mammogram for malignant neoplasm of breast: Secondary | ICD-10-CM

## 2011-10-18 ENCOUNTER — Ambulatory Visit
Admission: RE | Admit: 2011-10-18 | Discharge: 2011-10-18 | Disposition: A | Discharge status: Home / Self Care | Payer: BC Managed Care – PPO | Source: Ambulatory Visit | Attending: Obstetrics and Gynecology | Admitting: Obstetrics and Gynecology

## 2011-10-18 ENCOUNTER — Ambulatory Visit: Payer: BC Managed Care – PPO

## 2011-10-18 DIAGNOSIS — Z1231 Encounter for screening mammogram for malignant neoplasm of breast: Secondary | ICD-10-CM

## 2011-12-05 ENCOUNTER — Encounter: Payer: Self-pay | Admitting: Obstetrics and Gynecology

## 2011-12-19 ENCOUNTER — Ambulatory Visit (INDEPENDENT_AMBULATORY_CARE_PROVIDER_SITE_OTHER): Payer: BC Managed Care – PPO | Admitting: Obstetrics and Gynecology

## 2011-12-19 ENCOUNTER — Encounter: Payer: Self-pay | Admitting: Obstetrics and Gynecology

## 2011-12-19 VITALS — BP 110/70 | HR 72 | Resp 16 | Ht 71.0 in | Wt 209.0 lb

## 2011-12-19 DIAGNOSIS — N762 Acute vulvitis: Secondary | ICD-10-CM

## 2011-12-19 DIAGNOSIS — Z01419 Encounter for gynecological examination (general) (routine) without abnormal findings: Secondary | ICD-10-CM

## 2011-12-19 DIAGNOSIS — Z124 Encounter for screening for malignant neoplasm of cervix: Secondary | ICD-10-CM

## 2011-12-19 DIAGNOSIS — L732 Hidradenitis suppurativa: Secondary | ICD-10-CM | POA: Insufficient documentation

## 2011-12-19 DIAGNOSIS — N760 Acute vaginitis: Secondary | ICD-10-CM

## 2011-12-19 MED ORDER — CLOTRIMAZOLE-BETAMETHASONE 1-0.05 % EX CREA: TOPICAL_CREAM | Freq: Every day | CUTANEOUS | Status: AC

## 2011-12-19 MED ORDER — CEPHALEXIN 500 MG PO CAPS: 500.0000 mg | ORAL_CAPSULE | Freq: Two times a day (BID) | ORAL | Status: AC

## 2011-12-19 NOTE — Progress Notes (Signed)
Contraception BTL Last pap 10/11/2010 WNL Last Mammo 09/2011 WNL Last Colonoscopy None Last Dexa Scan None Primary MD Salome Spotted Abuse at Home None  No complaints.  Does not want surgery for fibroids.  Was previously using doxycycline for hidradenitis but made her constipated.  Filed Vitals:   12/19/11 1617  BP: 110/70  Pulse: 72  Resp: 16   ROS: noncontributory  Physical Examination: General appearance - alert, well appearing, and in no distress Neck - supple, no significant adenopathy Chest - clear to auscultation, no wheezes, rales or rhonchi, symmetric air entry Heart - normal rate and regular rhythm Abdomen - soft, nontender, nondistended, no masses or organomegaly Breasts - breasts appear normal, no suspicious masses, no skin or nipple changes or axillary nodes Pelvic - normal external genitalia, vulva, vagina, cervix, 10-12wks size uterus and adnexa Back exam - no CVAT Extremities - no edema, redness or tenderness in the calves or thighs  A/P Keflex trial for hidradenitis Sitz baths Lotrisone cream for ext vag itch (reported after exam was complete) Pap today Had mammo

## 2011-12-19 NOTE — Progress Notes (Deleted)
Patient ID: Teresa Kent, female   DOB: [birth year], 46 y.o.   MRN: [id number]

## 2011-12-26 ENCOUNTER — Encounter: Payer: Self-pay | Admitting: Obstetrics and Gynecology

## 2012-09-14 ENCOUNTER — Other Ambulatory Visit: Payer: Self-pay

## 2012-09-14 DIAGNOSIS — Z1231 Encounter for screening mammogram for malignant neoplasm of breast: Secondary | ICD-10-CM

## 2012-10-18 ENCOUNTER — Ambulatory Visit
Admission: RE | Admit: 2012-10-18 | Discharge: 2012-10-18 | Disposition: A | Discharge status: Home / Self Care | Payer: BC Managed Care – PPO | Source: Ambulatory Visit

## 2012-10-18 DIAGNOSIS — Z1231 Encounter for screening mammogram for malignant neoplasm of breast: Secondary | ICD-10-CM

## 2012-10-19 ENCOUNTER — Other Ambulatory Visit: Payer: Self-pay | Admitting: Obstetrics and Gynecology

## 2012-10-19 DIAGNOSIS — R928 Other abnormal and inconclusive findings on diagnostic imaging of breast: Secondary | ICD-10-CM

## 2012-10-23 ENCOUNTER — Other Ambulatory Visit: Payer: Self-pay | Admitting: Obstetrics and Gynecology

## 2012-10-23 DIAGNOSIS — R928 Other abnormal and inconclusive findings on diagnostic imaging of breast: Secondary | ICD-10-CM

## 2012-11-07 ENCOUNTER — Ambulatory Visit
Admission: RE | Admit: 2012-11-07 | Discharge: 2012-11-07 | Disposition: A | Discharge status: Home / Self Care | Payer: BC Managed Care – PPO | Source: Ambulatory Visit | Attending: Obstetrics and Gynecology | Admitting: Obstetrics and Gynecology

## 2012-11-07 DIAGNOSIS — R928 Other abnormal and inconclusive findings on diagnostic imaging of breast: Secondary | ICD-10-CM

## 2012-12-17 ENCOUNTER — Other Ambulatory Visit: Payer: Self-pay | Admitting: Obstetrics and Gynecology

## 2012-12-18 ENCOUNTER — Encounter (HOSPITAL_COMMUNITY): Payer: Self-pay

## 2012-12-18 ENCOUNTER — Encounter (HOSPITAL_COMMUNITY)
Admission: RE | Admit: 2012-12-18 | Discharge: 2012-12-18 | Disposition: A | Discharge status: Home / Self Care | Payer: BC Managed Care – PPO | Source: Ambulatory Visit | Attending: Obstetrics and Gynecology | Admitting: Obstetrics and Gynecology

## 2012-12-18 ENCOUNTER — Other Ambulatory Visit: Payer: Self-pay

## 2012-12-18 HISTORY — DX: Anemia, unspecified: D64.9

## 2012-12-18 LAB — CBC
Hemoglobin: 11.5 g/dL — ABNORMAL LOW (ref 12.0–15.0)
MCH: 26.6 pg (ref 26.0–34.0)
MCV: 85.2 fL (ref 78.0–100.0)
RBC: 4.33 MIL/uL (ref 3.87–5.11)
WBC: 5.5 10*3/uL (ref 4.0–10.5)

## 2012-12-18 LAB — BASIC METABOLIC PANEL
CO2: 29 mEq/L (ref 19–32)
GFR calc non Af Amer: >90 mL/min (ref >90)
Glucose, Bld: 86 mg/dL (ref 70–99)
Potassium: 3.7 mEq/L (ref 3.5–5.1)
Sodium: 136 mEq/L (ref 135–145)

## 2012-12-18 NOTE — Patient Instructions (Signed)
Your procedure is scheduled on:12/20/12  Enter through the Main Entrance at :1115am Pick up desk phone and dial [phone] and inform us of your arrival.  Please call [phone] if you have any problems the morning of surgery.  Remember: Do not eat food after midnight: WED Clear liquids are ok until:0830 am Thursday   You may brush your teeth the morning of surgery.  Take these meds the morning of surgery with a sip of water:Thyroid and BP meds  DO NOT wear jewelry, eye make-up, lipstick,body lotion, or dark fingernail polish.  (Polished toes are ok) You may wear deodorant.  If you are to be admitted after surgery, leave suitcase in car until your room has been assigned. Patients discharged on the day of surgery will not be allowed to drive home. Wear loose fitting, comfortable clothes for your ride home.

## 2012-12-19 ENCOUNTER — Other Ambulatory Visit: Payer: Self-pay | Admitting: Obstetrics and Gynecology

## 2012-12-19 NOTE — H&P (Signed)
Teresa Kent is a 47 y.o.  female, P 1-0-0-1 presents for hysteroscopy, dilatation, curettage and endometrial ablation because of menorrhagia and anemia.  For the past several years patient reports worsening menses characterized most recently by a four week flow with pad change every 2 hours but no cramping.  She has not had any change in her urinary or bowel function but admits that she always has constipation issues. Pelvic ultrasound in early July showed: uterus-6.09 x 5.55 x 6.74 cm, endometrium-3.69 mm, posterior intramural fibroid 3.66 x 3.67 x 4.20 cm; right ovary is surgically absent and left ovary-4.68 x 3.18 x 1.97 cm.  There was a large amount of free fluid in the cul-de-sac and adjacent to the left ovary, a tubular cystic structure consistent with a hydrosalpinx. Uterine length from cervix to fundus = 8.4 cm.   A TSH and prolactin done in May were normal and hemoglobin/hematocrit = 10.9/34.2 respectively.  In the past, patient states that she had been given some medical modalities to manage her bleeding but that she can't recall specifically.  After reviewing both medical and surgical management options for menorrhagia and fibroids she has decided that she wants to proceed with endometrial ablation.  Past Medical History  OB History: G1; P 1-0-0-1 SVD 1994 GYN History: menarche: 47 YO  LMP:  11/13/2012   Contracepton bilateral tubal ligation  The patient denies history of sexually transmitted disease.  Remote  history of abnormal PAP smear that was repeated and has been normal since; Last PAP smear: 2014  Medical History: Vitamin D deficiency, thyroid disease, hypertension, anemia, eczema and fibroids  Surgical History:  1994-Tubal Sterilization;  2000-Thyroid Surgery;  2005-Right Oophorectomy and Myomectomy Denies problems with anesthesia or history of blood transfusions  Family History:   Hypertension and diabetes  Social History:  Single and employed as a Teacher;  Denies alcohol,  tobacco or illicit drugs  Medications:  Maxide 75/50 daily Synthroid 200 mcg daily Vitamin D 50, 000 units weekly as directed  No Known Allergies  Denies sensitivity to peanuts, shellfish, soy, latex or adhesives.   ROS: Admits to glasses and chest/thigh/arm rash with eczema flare;  Denies headache, vision changes, nasal congestion, dysphagia, tinnitus, dizziness, hoarseness, cough,  chest pain, shortness of breath, nausea, vomiting, diarrhea,constipation,  urinary frequency, urgency  dysuria, hematuria, vaginitis symptoms, pelvic pain, swelling of joints,easy bruising,  myalgias, arthralgias,  unexplained weight loss and except as is mentioned in the history of present illness, patient's review of systems is otherwise negative.  Physical Exam  Bp: 110/60   P: 80   R: 12  Temp.: 99.4 degrees F orally   Weight: 233 lbs.   Height: 5'11"    BMI: 32.5  Neck: supple without masses or thyromegaly Lungs: clear to auscultation Heart: regular rate and rhythm Abdomen: soft, non-tender and no organomegaly Pelvic:EGBUS- wnl; vagina-normal rugae; uterus-normal size, cervix without lesions or motion tenderness; adnexae-no tenderness or masses Extremities:  no clubbing, cyanosis or edema   Assesment:   Menorrhagia   Disposition:  A discussion was held with patient regarding the indication for her procedure(s) along with the risks, which include but are not limited to: reaction to anesthesia, damage to adjacent organs, infection, excessive bleeding and no change in bleeding pattern.  The patient verbalized understanding of these risks and has consented to proceed with Hysteroscopy,Dilatation, Curettage and Endometrial Ablation at [facility], December 20, 2012 at 12:45 p.m.   CSN# [id number]   Teresa Zink J. Gerron Guidotti, PA-C  for   Dr. Angela Y. Roberts   

## 2012-12-20 ENCOUNTER — Encounter (HOSPITAL_COMMUNITY): Payer: Self-pay | Admitting: Anesthesiology

## 2012-12-20 ENCOUNTER — Encounter (HOSPITAL_COMMUNITY)
Admission: RE | Disposition: A | Discharge status: Home / Self Care | Payer: Self-pay | Source: Ambulatory Visit | Attending: Obstetrics and Gynecology

## 2012-12-20 ENCOUNTER — Ambulatory Visit (HOSPITAL_COMMUNITY): Payer: BC Managed Care – PPO | Admitting: Anesthesiology

## 2012-12-20 ENCOUNTER — Ambulatory Visit (HOSPITAL_COMMUNITY)
Admission: RE | Admit: 2012-12-20 | Discharge: 2012-12-20 | Disposition: A | Discharge status: Home / Self Care | Payer: BC Managed Care – PPO | Source: Ambulatory Visit | Attending: Obstetrics and Gynecology | Admitting: Obstetrics and Gynecology

## 2012-12-20 DIAGNOSIS — N920 Excessive and frequent menstruation with regular cycle: Secondary | ICD-10-CM | POA: Insufficient documentation

## 2012-12-20 DIAGNOSIS — D251 Intramural leiomyoma of uterus: Secondary | ICD-10-CM | POA: Insufficient documentation

## 2012-12-20 DIAGNOSIS — D500 Iron deficiency anemia secondary to blood loss (chronic): Secondary | ICD-10-CM | POA: Insufficient documentation

## 2012-12-20 HISTORY — PX: DILITATION & CURRETTAGE/HYSTROSCOPY WITH NOVASURE ABLATION: SHX5568

## 2012-12-20 SURGERY — DILATATION & CURETTAGE/HYSTEROSCOPY WITH NOVASURE ABLATION
Anesthesia: General | Wound class: Clean Contaminated

## 2012-12-20 MED ORDER — ONDANSETRON HCL 4 MG/2ML IJ SOLN
INTRAMUSCULAR | Status: AC
  Filled 2012-12-20: qty 2

## 2012-12-20 MED ORDER — KETOROLAC TROMETHAMINE 30 MG/ML IJ SOLN
INTRAMUSCULAR | Status: AC
  Filled 2012-12-20: qty 1

## 2012-12-20 MED ORDER — MIDAZOLAM HCL 5 MG/5ML IJ SOLN
INTRAMUSCULAR | Status: DC | PRN
  Administered 2012-12-20: 2 mg via INTRAVENOUS

## 2012-12-20 MED ORDER — IBUPROFEN 600 MG PO TABS: 600.0000 mg | ORAL_TABLET | Freq: Four times a day (QID) | ORAL | Status: DC | PRN

## 2012-12-20 MED ORDER — PROPOFOL 10 MG/ML IV BOLUS
INTRAVENOUS | Status: DC | PRN
  Administered 2012-12-20: 200 mg via INTRAVENOUS

## 2012-12-20 MED ORDER — KETOROLAC TROMETHAMINE 30 MG/ML IJ SOLN
INTRAMUSCULAR | Status: DC | PRN
  Administered 2012-12-20: 30 mg via INTRAVENOUS

## 2012-12-20 MED ORDER — MIDAZOLAM HCL 2 MG/2ML IJ SOLN
INTRAMUSCULAR | Status: AC
  Filled 2012-12-20: qty 2

## 2012-12-20 MED ORDER — HYDROCODONE-ACETAMINOPHEN 5-300 MG PO TABS: 1.0000 | ORAL_TABLET | Freq: Four times a day (QID) | ORAL | Status: DC | PRN

## 2012-12-20 MED ORDER — METOCLOPRAMIDE HCL 5 MG/ML IJ SOLN: 10.0000 mg | Freq: Once | INTRAMUSCULAR | Status: DC | PRN

## 2012-12-20 MED ORDER — LIDOCAINE HCL (CARDIAC) 20 MG/ML IV SOLN
INTRAVENOUS | Status: DC | PRN
  Administered 2012-12-20: 50 mg via INTRAVENOUS

## 2012-12-20 MED ORDER — FENTANYL CITRATE 0.05 MG/ML IJ SOLN: 25.0000 ug | INTRAMUSCULAR | Status: DC | PRN

## 2012-12-20 MED ORDER — LIDOCAINE HCL (CARDIAC) 20 MG/ML IV SOLN
INTRAVENOUS | Status: AC
  Filled 2012-12-20: qty 5

## 2012-12-20 MED ORDER — FENTANYL CITRATE 0.05 MG/ML IJ SOLN
INTRAMUSCULAR | Status: DC | PRN
  Administered 2012-12-20: 100 ug via INTRAVENOUS

## 2012-12-20 MED ORDER — MEPERIDINE HCL 25 MG/ML IJ SOLN: 6.2500 mg | INTRAMUSCULAR | Status: DC | PRN

## 2012-12-20 MED ORDER — ONDANSETRON HCL 4 MG/2ML IJ SOLN
INTRAMUSCULAR | Status: DC | PRN
  Administered 2012-12-20: 4 mg via INTRAVENOUS

## 2012-12-20 MED ORDER — FENTANYL CITRATE 0.05 MG/ML IJ SOLN
INTRAMUSCULAR | Status: AC
  Filled 2012-12-20: qty 2

## 2012-12-20 MED ORDER — LIDOCAINE HCL 1 % IJ SOLN
INTRAMUSCULAR | Status: DC | PRN
  Administered 2012-12-20: 10 mL

## 2012-12-20 MED ORDER — OXYCODONE-ACETAMINOPHEN 5-300 MG PO TABS: 1.0000 | ORAL_TABLET | Freq: Four times a day (QID) | ORAL | Status: DC | PRN

## 2012-12-20 MED ORDER — PROPOFOL 10 MG/ML IV EMUL
INTRAVENOUS | Status: AC
  Filled 2012-12-20: qty 20

## 2012-12-20 MED ORDER — DEXAMETHASONE SODIUM PHOSPHATE 10 MG/ML IJ SOLN
INTRAMUSCULAR | Status: DC | PRN
  Administered 2012-12-20: 10 mg via INTRAVENOUS

## 2012-12-20 MED ORDER — DEXAMETHASONE SODIUM PHOSPHATE 10 MG/ML IJ SOLN
INTRAMUSCULAR | Status: AC
  Filled 2012-12-20: qty 1

## 2012-12-20 MED ORDER — LACTATED RINGERS IV SOLN
INTRAVENOUS | Status: DC
  Administered 2012-12-20 (×2): via INTRAVENOUS

## 2012-12-20 SURGICAL SUPPLY — 14 items
ABLATOR ENDOMETRIAL BIPOLAR (ABLATOR) ×2 IMPLANT
CATH ROBINSON RED A/P 16FR (CATHETERS) ×2 IMPLANT
CLOTH BEACON ORANGE TIMEOUT ST (SAFETY) ×2 IMPLANT
CONTAINER PREFILL 10% NBF 60ML (FORM) ×4 IMPLANT
DRESSING TELFA 8X3 (GAUZE/BANDAGES/DRESSINGS) ×2 IMPLANT
GLOVE BIO SURGEON STRL SZ7.5 (GLOVE) ×2 IMPLANT
GLOVE BIOGEL PI IND STRL 7.5 (GLOVE) ×1 IMPLANT
GLOVE BIOGEL PI INDICATOR 7.5 (GLOVE) ×1
GOWN STRL REIN XL XLG (GOWN DISPOSABLE) ×6 IMPLANT
PACK HYSTEROSCOPY LF (CUSTOM PROCEDURE TRAY) ×2 IMPLANT
PAD OB MATERNITY 4.3X12.25 (PERSONAL CARE ITEMS) ×2 IMPLANT
PAD PREP 24X48 CUFFED NSTRL (MISCELLANEOUS) ×2 IMPLANT
TOWEL OR 17X24 6PK STRL BLUE (TOWEL DISPOSABLE) ×4 IMPLANT
WATER STERILE IRR 1000ML POUR (IV SOLUTION) ×2 IMPLANT

## 2012-12-20 NOTE — Op Note (Addendum)
Preop Diagnosis: Menorrhagia; Fibroid   Postop Diagnosis: Menorrhagia; Fibroid   Procedure: DILATATION & CURETTAGE/HYSTEROSCOPY WITH NOVASURE ABLATION   Anesthesia: General   Anesthesiologist: Cristela Blue, MD  Attending: Purcell Nails, MD   Assistant: N/a  Findings: Fluffy endometrium.  Uterine sound 10.5cm, Cervical length 4.0cm, Cavity Length 6.5cm, Cavity Width 4.5cm, Power 161 watts, Time 45secs.  Pathology: Endometrial Curettings  Fluids: 1200 cc  UOP: 300 cc  EBL: Minimal  Complications: None  Procedure:The patient was taken to the operating room after the risks, benefits and alternatives discussed with the patient. The patient verbalized understanding and consent signed and witnessed. The patient was given a spinal per anesthesia and prepped and draped in the normal sterile fashion and Time Out performed per protocol. A bivalve speculum was placed in the patient's vagina and the anterior lip of the cervix was grasped with a single tooth tenaculum. A paracervical block was administered using a total of 10 cc of 1% lidocaine. The uterus sounded to 10.5 cm. The cervix was dilated for passage of the hysteroscope. The hysteroscope was introduced into the uterine cavity and findings as noted above. Sharp curettage was performed until a gritty texture was noted and currettings sent to pathology. The hysteroscope was reintroduced and no obvious remaining intracavitary lesions were noted. The Novasure instrument was introduced and ablation performed without difficulty. Hysteroscope reintroduced and good ablation results were noted except for possibly a small area on rt lower uterine segment.  All instruments were removed.  Silver nitrate was used at the tenaculum sites for hemostasis.  Sponge lap and needle count was correct. The patient tolerated the procedure well and was returned to the recovery room in good condition.

## 2012-12-20 NOTE — Interval H&P Note (Signed)
History and Physical Interval Note:  12/20/2012 12:56 PM  Teresa Kent  has presented today for surgery, with the diagnosis of Menorrhagia; Fibroid  The various methods of treatment have been discussed with the patient and family. After consideration of risks, benefits and other options for treatment, the patient has consented to  Procedure(s): DILATATION & CURETTAGE/HYSTEROSCOPY WITH NOVASURE ABLATION (N/A) as a surgical intervention .  The patient's history has been reviewed, patient examined, no change in status, stable for surgery.  I have reviewed the patient's chart and labs.  Questions were answered to the patient's satisfaction.     Purcell Nails

## 2012-12-20 NOTE — H&P (View-Only) (Signed)
Teresa Kent is a 47 y.o.  female, P 1-0-0-1 presents for hysteroscopy, dilatation, curettage and endometrial ablation because of menorrhagia and anemia.  For the past several years patient reports worsening menses characterized most recently by a four week flow with pad change every 2 hours but no cramping.  She has not had any change in her urinary or bowel function but admits that she always has constipation issues. Pelvic ultrasound in early July showed: uterus-6.09 x 5.55 x 6.74 cm, endometrium-3.69 mm, posterior intramural fibroid 3.66 x 3.67 x 4.20 cm; right ovary is surgically absent and left ovary-4.68 x 3.18 x 1.97 cm.  There was a large amount of free fluid in the cul-de-sac and adjacent to the left ovary, a tubular cystic structure consistent with a hydrosalpinx. Uterine length from cervix to fundus = 8.4 cm.   A TSH and prolactin done in May were normal and hemoglobin/hematocrit = 10.9/34.2 respectively.  In the past, patient states that she had been given some medical modalities to manage her bleeding but that she can't recall specifically.  After reviewing both medical and surgical management options for menorrhagia and fibroids she has decided that she wants to proceed with endometrial ablation.  Past Medical History  OB History: G1; P 1-0-0-1 SVD 1994 GYN History: menarche: 47 YO  LMP:  11/13/2012   Contracepton bilateral tubal ligation  The patient denies history of sexually transmitted disease.  Remote  history of abnormal PAP smear that was repeated and has been normal since; Last PAP smear: 2014  Medical History: Vitamin D deficiency, thyroid disease, hypertension, anemia, eczema and fibroids  Surgical History:  1994-Tubal Sterilization;  2000-Thyroid Surgery;  2005-Right Oophorectomy and Myomectomy Denies problems with anesthesia or history of blood transfusions  Family History:   Hypertension and diabetes  Social History:  Single and employed as a Runner, broadcasting/film/video;  Denies alcohol,  tobacco or illicit drugs  Medications:  Maxide 75/50 daily Synthroid 200 mcg daily Vitamin D 50, 000 units weekly as directed  No Known Allergies  Denies sensitivity to peanuts, shellfish, soy, latex or adhesives.   ROS: Admits to glasses and chest/thigh/arm rash with eczema flare;  Denies headache, vision changes, nasal congestion, dysphagia, tinnitus, dizziness, hoarseness, cough,  chest pain, shortness of breath, nausea, vomiting, diarrhea,constipation,  urinary frequency, urgency  dysuria, hematuria, vaginitis symptoms, pelvic pain, swelling of joints,easy bruising,  myalgias, arthralgias,  unexplained weight loss and except as is mentioned in the history of present illness, patient's review of systems is otherwise negative.  Physical Exam  Bp: 110/60   P: 80   R: 12  Temp.: 99.4 degrees F orally   Weight: 233 lbs.   Height: 5'11"    BMI: 32.5  Neck: supple without masses or thyromegaly Lungs: clear to auscultation Heart: regular rate and rhythm Abdomen: soft, non-tender and no organomegaly Pelvic:EGBUS- wnl; vagina-normal rugae; uterus-normal size, cervix without lesions or motion tenderness; adnexae-no tenderness or masses Extremities:  no clubbing, cyanosis or edema   Assesment:   Menorrhagia   Disposition:  A discussion was held with patient regarding the indication for her procedure(s) along with the risks, which include but are not limited to: reaction to anesthesia, damage to adjacent organs, infection, excessive bleeding and no change in bleeding pattern.  The patient verbalized understanding of these risks and has consented to proceed with Hysteroscopy,Dilatation, Curettage and Endometrial Ablation at [facility] [place], December 20, 2012 at 12:45 p.m.   CSN# [id number]   Annebelle Bostic J. Lowell Guitar, PA-C  for  Dr. Woodroe Mode. Su Hilt

## 2012-12-20 NOTE — Anesthesia Preprocedure Evaluation (Signed)
Anesthesia Evaluation  Patient identified by MRN, date of birth, ID band Patient awake    Reviewed: Allergy & Precautions, H&P , NPO status , [name] Chart, lab work & pertinent test results  Airway Mallampati: II TM Distance: >3 FB Neck ROM: Full    Dental no notable dental hx. (+) Teeth Intact and Caps   Pulmonary neg pulmonary ROS,  breath sounds clear to auscultation  Pulmonary exam normal       Cardiovascular hypertension, Pt. on medications Rhythm:Regular Rate:Normal     Neuro/Psych negative neurological ROS  negative psych ROS   GI/Hepatic negative GI ROS, Neg liver ROS,   Endo/Other  Hypothyroidism   Renal/GU   negative genitourinary   Musculoskeletal negative musculoskeletal ROS (+)   Abdominal Normal abdominal exam  (+) + obese,   Peds  Hematology negative hematology ROS (+)   Anesthesia Other Findings   Reproductive/Obstetrics Menorrhagia Fibroid Uterus                           Anesthesia Physical Anesthesia Plan  ASA: II  Anesthesia Plan: General   Post-op Pain Management:    Induction: Intravenous  Airway Management Planned: LMA  Additional Equipment:   Intra-op Plan:   Post-operative Plan:   Informed Consent: I have reviewed the patients History and Physical, chart, labs and discussed the procedure including the risks, benefits and alternatives for the proposed anesthesia with the patient or authorized representative who has indicated his/her understanding and acceptance.   Dental advisory given  Plan Discussed with: CRNA, Anesthesiologist and Surgeon  Anesthesia Plan Comments:         Anesthesia Quick Evaluation

## 2012-12-20 NOTE — Transfer of Care (Signed)
Immediate Anesthesia Transfer of Care Note  Patient: Teresa Kent  Procedure(s) Performed: Procedure(s): DILATATION & CURETTAGE/HYSTEROSCOPY WITH NOVASURE ABLATION (N/A)  Patient Location: PACU  Anesthesia Type:General  Level of Consciousness: awake  Airway & Oxygen Therapy: Patient Spontanous Breathing  Post-op Assessment: Report given to PACU RN  Post vital signs: stable  Filed Vitals:   12/20/12 1111  BP: 113/77  Temp: 36.8 C  Resp: 20    Complications: No apparent anesthesia complications

## 2012-12-21 ENCOUNTER — Encounter (HOSPITAL_COMMUNITY): Payer: Self-pay | Admitting: Obstetrics and Gynecology

## 2012-12-24 NOTE — Anesthesia Postprocedure Evaluation (Signed)
  Anesthesia Post-op Note  Patient: Teresa Kent  Procedure(s) Performed: Procedure(s): DILATATION & CURETTAGE/HYSTEROSCOPY WITH NOVASURE ABLATION (N/A)  Patient is awake, responsive, moving her legs, and has signs of resolution of her numbness. Pain and nausea are reasonably well controlled. Vital signs are stable and clinically acceptable. Oxygen saturation is clinically acceptable. There are no apparent anesthetic complications at this time. Patient is ready for discharge.

## 2013-05-21 ENCOUNTER — Other Ambulatory Visit: Payer: Self-pay | Admitting: Obstetrics and Gynecology

## 2013-05-21 DIAGNOSIS — N6320 Unspecified lump in the left breast, unspecified quadrant: Secondary | ICD-10-CM

## 2013-05-28 ENCOUNTER — Ambulatory Visit
Admission: RE | Admit: 2013-05-28 | Discharge: 2013-05-28 | Disposition: A | Discharge status: Home / Self Care | Payer: BC Managed Care – PPO | Source: Ambulatory Visit | Attending: Obstetrics and Gynecology | Admitting: Obstetrics and Gynecology

## 2013-05-28 DIAGNOSIS — N6320 Unspecified lump in the left breast, unspecified quadrant: Secondary | ICD-10-CM

## 2013-10-09 ENCOUNTER — Other Ambulatory Visit: Payer: Self-pay

## 2013-10-09 DIAGNOSIS — Z1231 Encounter for screening mammogram for malignant neoplasm of breast: Secondary | ICD-10-CM

## 2013-10-25 ENCOUNTER — Ambulatory Visit
Admission: RE | Admit: 2013-10-25 | Discharge: 2013-10-25 | Disposition: A | Discharge status: Home / Self Care | Payer: BC Managed Care – PPO | Source: Ambulatory Visit

## 2013-10-25 DIAGNOSIS — Z1231 Encounter for screening mammogram for malignant neoplasm of breast: Secondary | ICD-10-CM

## 2014-03-24 ENCOUNTER — Encounter (HOSPITAL_COMMUNITY): Payer: Self-pay | Admitting: Obstetrics and Gynecology

## 2014-10-10 ENCOUNTER — Other Ambulatory Visit: Payer: Self-pay

## 2014-10-10 DIAGNOSIS — Z1231 Encounter for screening mammogram for malignant neoplasm of breast: Secondary | ICD-10-CM

## 2014-10-31 ENCOUNTER — Ambulatory Visit
Admission: RE | Admit: 2014-10-31 | Discharge: 2014-10-31 | Disposition: A | Discharge status: Home / Self Care | Payer: BLUE CROSS/BLUE SHIELD | Source: Ambulatory Visit

## 2014-10-31 DIAGNOSIS — Z1231 Encounter for screening mammogram for malignant neoplasm of breast: Secondary | ICD-10-CM

## 2014-12-24 ENCOUNTER — Other Ambulatory Visit: Payer: Self-pay | Admitting: Obstetrics and Gynecology

## 2015-09-28 ENCOUNTER — Other Ambulatory Visit: Payer: Self-pay

## 2015-09-28 DIAGNOSIS — Z1231 Encounter for screening mammogram for malignant neoplasm of breast: Secondary | ICD-10-CM

## 2015-11-03 ENCOUNTER — Ambulatory Visit: Payer: BLUE CROSS/BLUE SHIELD

## 2015-11-03 DIAGNOSIS — N809 Endometriosis, unspecified: Secondary | ICD-10-CM | POA: Insufficient documentation

## 2015-11-06 ENCOUNTER — Ambulatory Visit
Admission: RE | Admit: 2015-11-06 | Discharge: 2015-11-06 | Disposition: A | Discharge status: Home / Self Care | Payer: BLUE CROSS/BLUE SHIELD | Source: Ambulatory Visit

## 2015-11-06 DIAGNOSIS — Z1231 Encounter for screening mammogram for malignant neoplasm of breast: Secondary | ICD-10-CM

## 2015-12-30 ENCOUNTER — Ambulatory Visit: Payer: BLUE CROSS/BLUE SHIELD | Admitting: Gastroenterology

## 2016-08-09 ENCOUNTER — Ambulatory Visit
Admission: RE | Admit: 2016-08-09 | Discharge: 2016-08-09 | Disposition: A | Discharge status: Home / Self Care | Payer: BLUE CROSS/BLUE SHIELD | Source: Ambulatory Visit | Attending: Family Medicine | Admitting: Family Medicine

## 2016-08-09 ENCOUNTER — Other Ambulatory Visit: Payer: Self-pay | Admitting: Family Medicine

## 2016-08-09 DIAGNOSIS — R7989 Other specified abnormal findings of blood chemistry: Secondary | ICD-10-CM

## 2016-08-09 DIAGNOSIS — R0602 Shortness of breath: Secondary | ICD-10-CM

## 2016-08-09 MED ORDER — IOPAMIDOL (ISOVUE-370) INJECTION 76%
75.0000 mL | Freq: Once | INTRAVENOUS | Status: AC | PRN
  Administered 2016-08-09: 75 mL via INTRAVENOUS

## 2016-09-30 ENCOUNTER — Institutional Professional Consult (permissible substitution): Payer: BLUE CROSS/BLUE SHIELD | Admitting: Internal Medicine

## 2016-10-03 ENCOUNTER — Other Ambulatory Visit: Payer: Self-pay | Admitting: Obstetrics and Gynecology

## 2016-10-03 DIAGNOSIS — Z1231 Encounter for screening mammogram for malignant neoplasm of breast: Secondary | ICD-10-CM

## 2016-11-07 ENCOUNTER — Ambulatory Visit
Admission: RE | Admit: 2016-11-07 | Discharge: 2016-11-07 | Disposition: A | Discharge status: Home / Self Care | Payer: BLUE CROSS/BLUE SHIELD | Source: Ambulatory Visit | Attending: Obstetrics and Gynecology | Admitting: Obstetrics and Gynecology

## 2016-11-07 DIAGNOSIS — Z1231 Encounter for screening mammogram for malignant neoplasm of breast: Secondary | ICD-10-CM

## 2016-11-08 ENCOUNTER — Other Ambulatory Visit: Payer: Self-pay | Admitting: Obstetrics and Gynecology

## 2016-11-08 DIAGNOSIS — R928 Other abnormal and inconclusive findings on diagnostic imaging of breast: Secondary | ICD-10-CM

## 2016-11-14 ENCOUNTER — Other Ambulatory Visit: Payer: Self-pay | Admitting: Obstetrics and Gynecology

## 2016-11-14 ENCOUNTER — Ambulatory Visit
Admission: RE | Admit: 2016-11-14 | Discharge: 2016-11-14 | Disposition: A | Discharge status: Home / Self Care | Payer: BLUE CROSS/BLUE SHIELD | Source: Ambulatory Visit | Attending: Obstetrics and Gynecology | Admitting: Obstetrics and Gynecology

## 2016-11-14 DIAGNOSIS — R928 Other abnormal and inconclusive findings on diagnostic imaging of breast: Secondary | ICD-10-CM

## 2016-11-14 DIAGNOSIS — R921 Mammographic calcification found on diagnostic imaging of breast: Secondary | ICD-10-CM

## 2016-12-08 DIAGNOSIS — L680 Hirsutism: Secondary | ICD-10-CM | POA: Insufficient documentation

## 2016-12-08 DIAGNOSIS — L658 Other specified nonscarring hair loss: Secondary | ICD-10-CM | POA: Insufficient documentation

## 2016-12-08 DIAGNOSIS — L639 Alopecia areata, unspecified: Secondary | ICD-10-CM | POA: Insufficient documentation

## 2016-12-08 DIAGNOSIS — L219 Seborrheic dermatitis, unspecified: Secondary | ICD-10-CM | POA: Insufficient documentation

## 2016-12-08 DIAGNOSIS — L700 Acne vulgaris: Secondary | ICD-10-CM | POA: Insufficient documentation

## 2017-10-18 ENCOUNTER — Other Ambulatory Visit: Payer: Self-pay | Admitting: Obstetrics and Gynecology

## 2017-10-18 DIAGNOSIS — Z1231 Encounter for screening mammogram for malignant neoplasm of breast: Secondary | ICD-10-CM

## 2017-11-08 ENCOUNTER — Ambulatory Visit
Admission: RE | Admit: 2017-11-08 | Discharge: 2017-11-08 | Disposition: A | Discharge status: Home / Self Care | Payer: No Typology Code available for payment source | Source: Ambulatory Visit | Attending: Obstetrics and Gynecology | Admitting: Obstetrics and Gynecology

## 2017-11-08 DIAGNOSIS — Z1231 Encounter for screening mammogram for malignant neoplasm of breast: Secondary | ICD-10-CM

## 2018-11-05 ENCOUNTER — Other Ambulatory Visit: Payer: Self-pay | Admitting: Obstetrics and Gynecology

## 2018-11-20 ENCOUNTER — Other Ambulatory Visit (HOSPITAL_COMMUNITY): Payer: Self-pay | Admitting: *Deleted

## 2018-11-20 DIAGNOSIS — Z1231 Encounter for screening mammogram for malignant neoplasm of breast: Secondary | ICD-10-CM

## 2019-01-29 ENCOUNTER — Other Ambulatory Visit: Payer: Self-pay | Admitting: Family Medicine

## 2019-01-29 DIAGNOSIS — Z1231 Encounter for screening mammogram for malignant neoplasm of breast: Secondary | ICD-10-CM

## 2019-02-05 ENCOUNTER — Ambulatory Visit (HOSPITAL_COMMUNITY): Payer: No Typology Code available for payment source

## 2019-03-14 ENCOUNTER — Ambulatory Visit
Admission: RE | Admit: 2019-03-14 | Discharge: 2019-03-14 | Disposition: A | Discharge status: Home / Self Care | Payer: No Typology Code available for payment source | Source: Ambulatory Visit | Attending: Family Medicine | Admitting: Family Medicine

## 2019-03-14 ENCOUNTER — Other Ambulatory Visit: Payer: Self-pay

## 2019-03-14 DIAGNOSIS — Z1231 Encounter for screening mammogram for malignant neoplasm of breast: Secondary | ICD-10-CM

## 2020-03-31 ENCOUNTER — Other Ambulatory Visit: Payer: Self-pay | Admitting: Family Medicine

## 2020-03-31 DIAGNOSIS — Z1231 Encounter for screening mammogram for malignant neoplasm of breast: Secondary | ICD-10-CM

## 2020-04-01 ENCOUNTER — Ambulatory Visit
Admission: RE | Admit: 2020-04-01 | Discharge: 2020-04-01 | Disposition: A | Discharge status: Home / Self Care | Payer: No Typology Code available for payment source | Source: Ambulatory Visit | Attending: Family Medicine | Admitting: Family Medicine

## 2020-04-01 ENCOUNTER — Other Ambulatory Visit: Payer: Self-pay

## 2020-04-01 DIAGNOSIS — Z1231 Encounter for screening mammogram for malignant neoplasm of breast: Secondary | ICD-10-CM

## 2021-03-18 ENCOUNTER — Other Ambulatory Visit: Payer: Self-pay | Admitting: Family Medicine

## 2021-03-18 DIAGNOSIS — Z1231 Encounter for screening mammogram for malignant neoplasm of breast: Secondary | ICD-10-CM

## 2021-03-23 ENCOUNTER — Other Ambulatory Visit: Payer: Self-pay | Admitting: Family Medicine

## 2021-03-23 DIAGNOSIS — R1310 Dysphagia, unspecified: Secondary | ICD-10-CM

## 2021-03-25 ENCOUNTER — Ambulatory Visit
Admission: RE | Admit: 2021-03-25 | Discharge: 2021-03-25 | Disposition: A | Discharge status: Home / Self Care | Payer: No Typology Code available for payment source | Source: Ambulatory Visit | Attending: Family Medicine | Admitting: Family Medicine

## 2021-03-25 DIAGNOSIS — R1310 Dysphagia, unspecified: Secondary | ICD-10-CM

## 2021-04-27 ENCOUNTER — Ambulatory Visit
Admission: RE | Admit: 2021-04-27 | Discharge: 2021-04-27 | Disposition: A | Discharge status: Home / Self Care | Payer: No Typology Code available for payment source | Source: Ambulatory Visit | Attending: Family Medicine | Admitting: Family Medicine

## 2021-04-27 DIAGNOSIS — Z1231 Encounter for screening mammogram for malignant neoplasm of breast: Secondary | ICD-10-CM

## 2021-09-03 ENCOUNTER — Ambulatory Visit: Payer: Self-pay

## 2021-09-06 ENCOUNTER — Other Ambulatory Visit: Payer: Self-pay | Admitting: Physician Assistant

## 2021-09-07 ENCOUNTER — Other Ambulatory Visit: Payer: Self-pay | Admitting: Physician Assistant

## 2021-09-08 ENCOUNTER — Other Ambulatory Visit: Payer: Self-pay | Admitting: Physician Assistant

## 2021-09-08 DIAGNOSIS — M549 Dorsalgia, unspecified: Secondary | ICD-10-CM

## 2021-09-19 ENCOUNTER — Ambulatory Visit
Admission: RE | Admit: 2021-09-19 | Discharge: 2021-09-19 | Disposition: A | Discharge status: Home / Self Care | Payer: No Typology Code available for payment source | Source: Ambulatory Visit | Attending: Physician Assistant | Admitting: Physician Assistant

## 2021-09-19 DIAGNOSIS — M549 Dorsalgia, unspecified: Secondary | ICD-10-CM

## 2021-09-20 DIAGNOSIS — Z124 Encounter for screening for malignant neoplasm of cervix: Secondary | ICD-10-CM

## 2021-09-20 HISTORY — DX: Encounter for screening for malignant neoplasm of cervix: Z12.4

## 2021-09-27 ENCOUNTER — Other Ambulatory Visit: Payer: No Typology Code available for payment source

## 2021-10-16 DIAGNOSIS — M545 Low back pain, unspecified: Secondary | ICD-10-CM | POA: Insufficient documentation

## 2021-10-16 DIAGNOSIS — M546 Pain in thoracic spine: Secondary | ICD-10-CM | POA: Insufficient documentation

## 2021-11-09 ENCOUNTER — Telehealth: Payer: No Typology Code available for payment source | Admitting: Physician Assistant

## 2021-11-09 DIAGNOSIS — K219 Gastro-esophageal reflux disease without esophagitis: Secondary | ICD-10-CM

## 2021-11-09 MED ORDER — OMEPRAZOLE 20 MG PO CPDR: 20.0000 mg | DELAYED_RELEASE_CAPSULE | Freq: Every day | ORAL | 0 refills | Status: DC

## 2021-11-09 NOTE — Progress Notes (Signed)
E-Visit for Heartburn  We are sorry that you are not feeling well.  Here is how we plan to help!  Based on what you shared with me it looks like you most likely have Gastroesophageal Reflux Disease (GERD)  Gastroesophageal reflux disease (GERD) happens when acid from your stomach flows up into the esophagus.  When acid comes in contact with the esophagus, the acid causes sorenss (inflammation) in the esophagus.  Over time, GERD may create small holes (ulcers) in the lining of the esophagus.  I have prescribed Omeprazole 20 mg one by mouth daily until you follow up with a provider.-- you need to follow-up with your PCP for full examination and likely referral to a Gastroenterologist giving some of the swallowing issues you have noted.   Your symptoms should improve in the next day or two.  You can use antacids as needed until symptoms resolve.  Call us if your heartburn worsens, you have trouble swallowing, weight loss, spitting up blood or recurrent vomiting.  Home Care: May include lifestyle changes such as weight loss, quitting smoking and alcohol consumption Avoid foods and drinks that make your symptoms worse, such as: Caffeine or alcoholic drinks Chocolate Peppermint or mint flavorings Garlic and onions Spicy foods Citrus fruits, such as oranges, lemons, or limes Tomato-based foods such as sauce, chili, salsa and pizza Fried and fatty foods Avoid lying down for 3 hours prior to your bedtime or prior to taking a nap Eat small, frequent meals instead of a large meals Wear loose-fitting clothing.  Do not wear anything tight around your waist that causes pressure on your stomach. Raise the head of your bed 6 to 8 inches with wood blocks to help you sleep.  Extra pillows will not help.  Seek Help Right Away If: You have pain in your arms, neck, jaw, teeth or back Your pain increases or changes in intensity or duration You develop nausea, vomiting or sweating (diaphoresis) You develop  shortness of breath or you faint Your vomit is green, yellow, black or looks like coffee grounds or blood Your stool is red, bloody or black  These symptoms could be signs of other problems, such as heart disease, gastric bleeding or esophageal bleeding.  Make sure you : Understand these instructions. Will watch your condition. Will get help right away if you are not doing well or get worse.  Your e-visit answers were reviewed by a board certified advanced clinical practitioner to complete your personal care plan.  Depending on the condition, your plan could have included both over the counter or prescription medications.  If there is a problem please reply  once you have received a response from your provider.  Your safety is important to [place].  If you have drug allergies check your prescription carefully.    You can use MyChart to ask questions about today's visit, request a non-urgent call back, or ask for a work or school excuse for 24 hours related to this e-Visit. If it has been greater than 24 hours you will need to follow up with your provider, or enter a new e-Visit to address those concerns.  You will get an e-mail in the next two days asking about your experience.  I hope that your e-visit has been valuable and will speed your recovery. Thank you for using e-visits.

## 2021-11-09 NOTE — Progress Notes (Signed)
I have spent 5 minutes in review of e-visit questionnaire, review and updating patient chart, medical decision making and response to patient.   Hersh Minney Cody Robin Petrakis, PA-C    

## 2021-11-17 ENCOUNTER — Other Ambulatory Visit: Payer: Self-pay | Admitting: Physician Assistant

## 2021-11-17 DIAGNOSIS — R634 Abnormal weight loss: Secondary | ICD-10-CM

## 2021-11-17 DIAGNOSIS — R6881 Early satiety: Secondary | ICD-10-CM

## 2021-11-17 DIAGNOSIS — R1013 Epigastric pain: Secondary | ICD-10-CM

## 2022-01-04 ENCOUNTER — Ambulatory Visit
Admission: EM | Admit: 2022-01-04 | Discharge: 2022-01-04 | Disposition: A | Discharge status: Home / Self Care | Payer: No Typology Code available for payment source | Attending: Urgent Care | Admitting: Urgent Care

## 2022-01-04 ENCOUNTER — Encounter: Payer: Self-pay | Admitting: Emergency Medicine

## 2022-01-04 DIAGNOSIS — G8929 Other chronic pain: Secondary | ICD-10-CM

## 2022-01-04 DIAGNOSIS — M545 Low back pain, unspecified: Secondary | ICD-10-CM

## 2022-01-04 DIAGNOSIS — I10 Essential (primary) hypertension: Secondary | ICD-10-CM

## 2022-01-04 DIAGNOSIS — R0789 Other chest pain: Secondary | ICD-10-CM

## 2022-01-04 DIAGNOSIS — M2550 Pain in unspecified joint: Secondary | ICD-10-CM

## 2022-01-04 DIAGNOSIS — M5136 Other intervertebral disc degeneration, lumbar region: Secondary | ICD-10-CM

## 2022-01-04 DIAGNOSIS — M48061 Spinal stenosis, lumbar region without neurogenic claudication: Secondary | ICD-10-CM

## 2022-01-04 MED ORDER — PREDNISONE 50 MG PO TABS: 50.0000 mg | ORAL_TABLET | Freq: Every day | ORAL | 0 refills | Status: DC

## 2022-01-04 NOTE — Discharge Instructions (Signed)
Make sure you follow-up with your primary care provider regarding your chest symptoms.  Please try to establish care with an orthopedist to evaluate your multiple joint pains.  Follow-up with your back specialist regarding your chronic low back pain.  For now take the oral prednisone course to help you with your back pain in the setting of having arthritis and spinal stenosis of your low back.

## 2022-01-04 NOTE — ED Provider Notes (Signed)
[place]   MRN: [id number] DOB: [birth year]  Subjective:   Teresa Kent is a 56 y.o. female presenting for recurrent low back pain.  Patient has a history of degenerative disc disease, spinal stenosis above the lumbar region and thoracic region.  She is trying to see a back specialist.  Does not have an orthopedist.  However she does have multiple pains in different joints of her bodies including her knees, left shoulder.  Today she did experience sensation of chest pressure and chest discomfort, left arm tingling.  No diaphoresis, nausea, vomiting, abdominal pain, left-sided neck or jaw pain.  No history of MI, heart disease.  Has a history of hypertension.  She is not a smoker.  No diabetes.  No current facility-administered medications for this encounter.  Current Outpatient Medications:    amoxicillin (AMOXIL) 500 MG tablet, Take 500 mg by mouth 3 (three) times daily. Pt got this med filled 03/01/11 , Disp: , Rfl:    ibuprofen (ADVIL,MOTRIN) 600 MG tablet, Take 1 tablet (600 mg total) by mouth every 6 (six) hours as needed for pain., Disp: 30 tablet, Rfl: 0   levothyroxine (SYNTHROID, LEVOTHROID) 200 MCG tablet, Take 200 mcg by mouth daily.  , Disp: , Rfl:    Multiple Vitamin (MULTIVITAMIN) tablet, Take 1 tablet by mouth 2 (two) times daily.  , Disp: , Rfl:    omeprazole (PRILOSEC) 20 MG capsule, Take 1 capsule (20 mg total) by mouth daily., Disp: 30 capsule, Rfl: 0   oxycodone-acetaminophen (LYNOX) 5-300 MG per tablet, Take 1 tablet by mouth every 6 (six) hours as needed for pain., Disp: 30 tablet, Rfl: 0   triamterene-hydrochlorothiazide (MAXZIDE) 75-50 MG per tablet, Take 0.5 tablets by mouth daily.  , Disp: , Rfl:    No Known Allergies  Past Medical History:  Diagnosis Date   Abnormal Pap smear 02/2003   Anemia    heavy menses   Endometriosis 02/2004   Fibroids, submucosal 02/2004   H/O: menorrhagia 02/2004   Hypertension    Hypothyroidism     Increased BMI 10/11/10   Ovarian cyst, right 03/19/2004   Simple endometrial hyperplasia 02/2004     Past Surgical History:  Procedure Laterality Date   DILATION AND CURETTAGE OF UTERUS  03/19/2004   DILITATION & CURRETTAGE/HYSTROSCOPY WITH NOVASURE ABLATION N/A 12/20/2012   Procedure: DILATATION & CURETTAGE/HYSTEROSCOPY WITH NOVASURE ABLATION;  Surgeon: Delice Lesch, MD;  Location: [facility];  Service: Gynecology;  Laterality: N/A;   OVARY SURGERY     resection of fibroid  03/19/2004   THYROID SURGERY     TUBAL LIGATION      Family History  Problem Relation Age of Onset   Breast cancer Maternal Grandmother     Social History   Tobacco Use   Smoking status: Never  Substance Use Topics   Alcohol use: No   Drug use: No    ROS   Objective:   Vitals: BP 125/85   Pulse 83   Temp 98.1 F (36.7 C)   Resp 16   SpO2 98%   Physical Exam Constitutional:      General: She is not in acute distress.    Appearance: Normal appearance. She is well-developed. She is not ill-appearing, toxic-appearing or diaphoretic.  HENT:     Head: Normocephalic and atraumatic.     Nose: Nose normal.     Mouth/Throat:     Mouth: Mucous membranes are moist.  Eyes:     General:  No scleral icterus.       Right eye: No discharge.        Left eye: No discharge.     Extraocular Movements: Extraocular movements intact.  Cardiovascular:     Rate and Rhythm: Normal rate and regular rhythm.     Heart sounds: Normal heart sounds. No murmur heard.    No friction rub. No gallop.  Pulmonary:     Effort: Pulmonary effort is normal. No respiratory distress.     Breath sounds: No stridor. No wheezing, rhonchi or rales.  Chest:     Chest wall: No tenderness.  Musculoskeletal:     Lumbar back: Tenderness present. No swelling, edema, deformity, signs of trauma, lacerations, spasms or bony tenderness. Normal range of motion. Negative right straight leg raise test and negative left straight leg raise test.  No scoliosis.       Back:  Skin:    General: Skin is warm and dry.  Neurological:     General: No focal deficit present.     Mental Status: She is alert and oriented to person, place, and time.     Cranial Nerves: No cranial nerve deficit.     Motor: No weakness.     Coordination: Coordination normal.     Gait: Gait normal.     Deep Tendon Reflexes: Reflexes normal.     Comments: No facial asymmetry.  Negative pronator drift.  Psychiatric:        Mood and Affect: Mood normal.        Behavior: Behavior normal.        Thought Content: Thought content normal.        Judgment: Judgment normal.    ED ECG REPORT   Date: 01/04/2022  EKG Time: 7:50 PM  Rate: 75bpm  Rhythm: normal sinus rhythm,  unchanged from previous tracings  Axis: left  Intervals:none  ST&T Change: Nonspecific T wave flattening  Narrative Interpretation: Sinus rhythm at 75 bpm with criteria met for LVH and nonspecific T wave flattening.  Very comparable to previous EKGs from 2014.   Assessment and Plan :   PDMP not reviewed this encounter.  1. Chronic low back pain without sciatica, unspecified back pain laterality   2. Degenerative disc disease, lumbar   3. Spinal stenosis of lumbar region, unspecified whether neurogenic claudication present   4. Chest pressure   5. Multiple joint pain   6. Essential hypertension    No signs of ACS on her EKG.  Recommended follow-up with her PCP.  Maintain all regular medications.  I advised that she follow-up with an orthopedist group and provided her with information for this to evaluate for arthritic joints.  Recommended oral prednisone course for her acute on chronic low back pain in the context of degenerative disc disease and lumbar spinal stenosis.  Counseled patient on potential for adverse effects with medications prescribed/recommended today, ER and return-to-clinic precautions discussed, patient verbalized understanding.    Jaynee Eagles, PA-C 01/04/22 2000

## 2022-01-04 NOTE — ED Triage Notes (Signed)
Pt here with pain to multiple areas of her body, but most concentrated in lower right back. Pt also complains of tingling and heaviness in left arm and both legs since yesterday. Pt also states that she feels like something is stuck in her throat.

## 2022-01-19 ENCOUNTER — Ambulatory Visit: Payer: No Typology Code available for payment source | Admitting: Internal Medicine

## 2022-01-31 ENCOUNTER — Ambulatory Visit: Payer: Self-pay

## 2022-03-10 DIAGNOSIS — Z1211 Encounter for screening for malignant neoplasm of colon: Secondary | ICD-10-CM

## 2022-03-10 HISTORY — DX: Encounter for screening for malignant neoplasm of colon: Z12.11

## 2022-04-03 ENCOUNTER — Ambulatory Visit
Admission: EM | Admit: 2022-04-03 | Discharge: 2022-04-03 | Disposition: A | Discharge status: Home / Self Care | Payer: No Typology Code available for payment source | Attending: Urgent Care | Admitting: Urgent Care

## 2022-04-03 ENCOUNTER — Encounter: Payer: Self-pay | Admitting: Emergency Medicine

## 2022-04-03 DIAGNOSIS — M436 Torticollis: Secondary | ICD-10-CM

## 2022-04-03 DIAGNOSIS — M542 Cervicalgia: Secondary | ICD-10-CM

## 2022-04-03 DIAGNOSIS — M5124 Other intervertebral disc displacement, thoracic region: Secondary | ICD-10-CM

## 2022-04-03 DIAGNOSIS — R519 Headache, unspecified: Secondary | ICD-10-CM

## 2022-04-03 DIAGNOSIS — R0789 Other chest pain: Secondary | ICD-10-CM

## 2022-04-03 DIAGNOSIS — M62838 Other muscle spasm: Secondary | ICD-10-CM

## 2022-04-03 DIAGNOSIS — I10 Essential (primary) hypertension: Secondary | ICD-10-CM

## 2022-04-03 MED ORDER — TIZANIDINE HCL 4 MG PO TABS: 4.0000 mg | ORAL_TABLET | Freq: Every day | ORAL | 0 refills | Status: DC

## 2022-04-03 MED ORDER — PREDNISONE 50 MG PO TABS: 50.0000 mg | ORAL_TABLET | Freq: Every day | ORAL | 0 refills | Status: DC

## 2022-04-03 NOTE — ED Triage Notes (Signed)
Pt had neck pains for about 2 weeks. Monday got pains in head. Last night having left sided head pressure and muscle like spasm on left side of head and neck and shoulder area. Reports jaw popped last night as well. Pt adds having chills too.

## 2022-04-03 NOTE — ED Provider Notes (Signed)
[facility]  Note:  This document was prepared using Systems analyst and may include unintentional dictation errors.  MRN: [id number] DOB: 27-Jun-1965  Subjective:   Teresa Kent is a 56 y.o. female presenting for 2 week history of left sided headache, tremors/spasms, neck pain/tightness, left-sided chest pain, chest tightness and pulling sensation from the left side.  Overnight she started to have chills and tremors.  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  She has a history of hypertension but no history of heart disease, cerebrovascular disease.  Has a history of thyroid disease and takes this medication consistently.  She does have a PCP, last follow-up was a couple of months ago and reports that everything checked out fine.  No fall, trauma, confusion, weakness, numbness or tingling, shortness of breath, heart racing, coughing.  No current facility-administered medications for this encounter.  Current Outpatient Medications:    ibuprofen (ADVIL,MOTRIN) 600 MG tablet, Take 1 tablet (600 mg total) by mouth every 6 (six) hours as needed for pain., Disp: 30 tablet, Rfl: 0   levothyroxine (SYNTHROID, LEVOTHROID) 200 MCG tablet, Take 200 mcg by mouth daily.  , Disp: , Rfl:    Multiple Vitamin (MULTIVITAMIN) tablet, Take 1 tablet by mouth 2 (two) times daily.  , Disp: , Rfl:    triamterene-hydrochlorothiazide (MAXZIDE) 75-50 MG per tablet, Take 0.5 tablets by mouth daily.  , Disp: , Rfl:    No Known Allergies  Past Medical History:  Diagnosis Date   Abnormal Pap smear 02/2003   Anemia    heavy menses   Endometriosis 02/2004   Fibroids, submucosal 02/2004   H/O: menorrhagia 02/2004   Hypertension    Hypothyroidism    Increased BMI 10/11/10   Ovarian cyst, right 03/19/2004   Simple endometrial hyperplasia 02/2004     Past Surgical History:  Procedure Laterality  Date   DILATION AND CURETTAGE OF UTERUS  03/19/2004   DILITATION & CURRETTAGE/HYSTROSCOPY WITH NOVASURE ABLATION N/A 12/20/2012   Procedure: DILATATION & CURETTAGE/HYSTEROSCOPY WITH NOVASURE ABLATION;  Surgeon: Delice Lesch, MD;  Location: [facility];  Service: Gynecology;  Laterality: N/A;   OVARY SURGERY     resection of fibroid  03/19/2004   THYROID SURGERY     TUBAL LIGATION      Family History  Problem Relation Age of Onset   Breast cancer Maternal Grandmother     Social History   Tobacco Use   Smoking status: Never  Substance Use Topics   Alcohol use: No   Drug use: No    ROS   Objective:   Vitals: BP 124/85 (BP Location: Left Arm)   Pulse 60   Temp 98.9 F (37.2 C) (Oral)   Resp 17   SpO2 99%   Physical Exam Constitutional:      General: She is not in acute distress.    Appearance: Normal appearance. She is well-developed and normal weight. She is not ill-appearing, toxic-appearing or diaphoretic.  HENT:     Head: Normocephalic and atraumatic.     Right Ear: Tympanic membrane, ear canal and external ear normal. No drainage or tenderness. No middle ear effusion. There is no impacted cerumen. Tympanic membrane is not erythematous or bulging.     Left Ear: Tympanic membrane, ear canal and external ear normal. No drainage or tenderness.  No middle ear effusion. There is no impacted cerumen. Tympanic membrane is not  erythematous or bulging.     Nose: Nose normal. No congestion or rhinorrhea.     Mouth/Throat:     Mouth: Mucous membranes are moist. No oral lesions.     Pharynx: No pharyngeal swelling, oropharyngeal exudate, posterior oropharyngeal erythema or uvula swelling.     Tonsils: No tonsillar exudate or tonsillar abscesses.  Eyes:     General: No scleral icterus.       Right eye: No discharge.        Left eye: No discharge.     Extraocular Movements: Extraocular movements intact.     Right eye: Normal extraocular motion.     Left eye: Normal extraocular  motion.     Conjunctiva/sclera: Conjunctivae normal.  Cardiovascular:     Rate and Rhythm: Normal rate and regular rhythm.     Heart sounds: Normal heart sounds. No murmur heard.    No friction rub. No gallop.  Pulmonary:     Effort: Pulmonary effort is normal. No respiratory distress.     Breath sounds: No stridor. No wheezing, rhonchi or rales.  Chest:     Chest wall: No tenderness.  Musculoskeletal:     Cervical back: Normal range of motion and neck supple.  Lymphadenopathy:     Cervical: No cervical adenopathy.  Skin:    General: Skin is warm and dry.  Neurological:     General: No focal deficit present.     Mental Status: She is alert and oriented to person, place, and time.     Cranial Nerves: No cranial nerve deficit.     Motor: No weakness.     Coordination: Coordination normal.     Gait: Gait normal.     Deep Tendon Reflexes: Reflexes normal.  Psychiatric:        Mood and Affect: Mood normal.        Behavior: Behavior normal.        Thought Content: Thought content normal.        Judgment: Judgment normal.    ED ECG REPORT   Date: 04/03/2022  EKG Time: 8:46 AM  Rate: 58bpm  Rhythm: sinus bradycardia,  unchanged from previous tracings  Axis: left  Intervals:none  ST&T Change: t-wave flattening throughout, except t-wave inversion in III, V3.   Narrative Interpretation: Sinus bradycardia with nonspecific T wave changes, left axis deviation, minimal voltage criteria for LVH.  Completely unchanged from previous EKG.   Assessment and Plan :   PDMP not reviewed this encounter.  1. Left-sided headache   2. Muscle spasm   3. Atypical chest pain   4. Essential hypertension   5. Neck stiffness   6. Neck pain   7. 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  EKG is negative and unchanged.  Low suspicion for an acute encephalopathy given normal neurologic  exam.  Suspect that her symptoms may be related to her back history, arthritis, this protrusions.  Recommended an oral prednisone course, muscle relaxant.  Patient is to continue to hydrate as well as she does.  Recheck with PCP regarding her extensive symptoms. Counseled patient on potential for adverse effects with medications prescribed/recommended today, ER and return-to-clinic precautions discussed, patient verbalized understanding.    Jaynee Eagles, [place] 04/03/22 [phone]

## 2022-04-05 ENCOUNTER — Other Ambulatory Visit: Payer: Self-pay | Admitting: Physician Assistant

## 2022-04-05 DIAGNOSIS — R519 Headache, unspecified: Secondary | ICD-10-CM

## 2022-04-06 ENCOUNTER — Other Ambulatory Visit: Payer: Self-pay | Admitting: Family Medicine

## 2022-04-06 DIAGNOSIS — Z1231 Encounter for screening mammogram for malignant neoplasm of breast: Secondary | ICD-10-CM

## 2022-04-07 ENCOUNTER — Ambulatory Visit: Payer: Self-pay | Attending: Cardiology | Admitting: Cardiology

## 2022-04-07 ENCOUNTER — Encounter: Payer: Self-pay | Admitting: Cardiology

## 2022-04-07 VITALS — BP 120/70 | HR 63 | Ht 71.0 in | Wt 237.6 lb

## 2022-04-07 DIAGNOSIS — E782 Mixed hyperlipidemia: Secondary | ICD-10-CM

## 2022-04-07 DIAGNOSIS — Z01812 Encounter for preprocedural laboratory examination: Secondary | ICD-10-CM

## 2022-04-07 DIAGNOSIS — E669 Obesity, unspecified: Secondary | ICD-10-CM

## 2022-04-07 DIAGNOSIS — I10 Essential (primary) hypertension: Secondary | ICD-10-CM

## 2022-04-07 DIAGNOSIS — R072 Precordial pain: Secondary | ICD-10-CM

## 2022-04-07 LAB — BASIC METABOLIC PANEL
BUN/Creatinine Ratio: 20 (ref 9–23)
BUN: 17 mg/dL (ref 6–24)
CO2: 27 mmol/L (ref 20–29)
Calcium: 9.7 mg/dL (ref 8.7–10.2)
Chloride: 99 mmol/L (ref 96–106)
Creatinine, Ser: 0.84 mg/dL (ref 0.57–1.00)
Glucose: 81 mg/dL (ref 70–99)
Potassium: 3.7 mmol/L (ref 3.5–5.2)
Sodium: 140 mmol/L (ref 134–144)
eGFR: 83 mL/min/{1.73_m2} (ref >59)

## 2022-04-07 MED ORDER — METOPROLOL TARTRATE 25 MG PO TABS: 25.0000 mg | ORAL_TABLET | Freq: Once | ORAL | 0 refills | Status: DC

## 2022-04-07 NOTE — Progress Notes (Signed)
Cardiology Office Note:    Date:  04/07/2022   ID:  Teresa Kent, DOB [birth year], MRN [id number]  PCP:  Caren Macadam, MD  Cardiologist:  Berniece Salines, DO  Electrophysiologist:  None   Referring MD: Kent Prose, MD   " I am doing well"  History of Present Illness:    Teresa Kent is a 56 y.o. female with a hx of hypertension, upper lipidemia, endometriosis with fibroids, hypothyroidism here today to be evaluated for chest pain.  The patient tells me that she was referred by her GI doctor because she had been experiencing chest pain.  At first her PCP felt this was GERD and she was referred to GI.  But she was started on cimetidine and this has not helped.  She described this pain as a left-sided sometimes cramping and pulling sensation.  This started about 2 weeks ago it is getting worse.  Nothing makes it better or worse.  She is concerned.  Past Medical History:  Diagnosis Date   Abnormal Pap smear 02/2003   Anemia    heavy menses   Endometriosis 02/2004   Fibroids, submucosal 02/2004   H/O: menorrhagia 02/2004   Hypertension    Hypothyroidism    Increased BMI 10/11/10   Ovarian cyst, right 03/19/2004   Simple endometrial hyperplasia 02/2004    Past Surgical History:  Procedure Laterality Date   DILATION AND CURETTAGE OF UTERUS  03/19/2004   DILITATION & CURRETTAGE/HYSTROSCOPY WITH NOVASURE ABLATION N/A 12/20/2012   Procedure: DILATATION & CURETTAGE/HYSTEROSCOPY WITH NOVASURE ABLATION;  Surgeon: Delice Lesch, MD;  Location: [facility];  Service: Gynecology;  Laterality: N/A;   OVARY SURGERY     resection of fibroid  03/19/2004   THYROID SURGERY     TUBAL LIGATION      Current Medications: Current Meds  Medication Sig   gabapentin (NEURONTIN) 100 MG capsule Take 100-300 mg by mouth at bedtime.   ibuprofen (ADVIL,MOTRIN) 600 MG tablet Take 1 tablet (600 mg total) by mouth every 6 (six) hours as needed for pain.   levothyroxine (SYNTHROID, LEVOTHROID) 200  MCG tablet Take 200 mcg by mouth daily.     metoprolol tartrate (LOPRESSOR) 25 MG tablet Take 1 tablet (25 mg total) by mouth once for 1 dose.   Multiple Vitamin (MULTIVITAMIN) tablet Take 1 tablet by mouth 2 (two) times daily.     predniSONE (STERAPRED UNI-PAK 21 TAB) 10 MG (21) TBPK tablet Take by mouth as directed.   tiZANidine (ZANAFLEX) 4 MG tablet Take 1 tablet (4 mg total) by mouth at bedtime.   triamterene-hydrochlorothiazide (MAXZIDE) 75-50 MG per tablet Take 0.5 tablets by mouth daily.       Allergies:   Patient has no known allergies.   Social History   Socioeconomic History   Marital status: Single    Spouse name: Not on file   Number of children: Not on file   Years of education: Not on file   Highest education level: Not on file  Occupational History   Not on file  Tobacco Use   Smoking status: Never   Smokeless tobacco: Not on file  Substance and Sexual Activity   Alcohol use: No   Drug use: No   Sexual activity: Never    Birth control/protection: Surgical  Other Topics Concern   Not on file  Social History Narrative   Not on file   Social Determinants of Health   Financial Resource Strain: Not on file  Food Insecurity: Not on  file  Transportation Needs: Not on file  Physical Activity: Not on file  Stress: Not on file  Social Connections: Not on file     Family History: The patient's family history includes Breast cancer in her maternal grandmother.  ROS:   Review of Systems  Constitution: Negative for decreased appetite, fever and weight gain.  HENT: Negative for congestion, ear discharge, hoarse voice and sore throat.   Eyes: Negative for discharge, redness, vision loss in right eye and visual halos.  Cardiovascular: Reports chest pain.  Negative for dyspnea on exertion, leg swelling, orthopnea and palpitations.  Respiratory: Negative for cough, hemoptysis, shortness of breath and snoring.   Endocrine: Negative for heat intolerance and polyphagia.   Hematologic/Lymphatic: Negative for bleeding problem. Does not bruise/bleed easily.  Skin: Negative for flushing, nail changes, rash and suspicious lesions.  Musculoskeletal: Negative for arthritis, joint pain, muscle cramps, myalgias, neck pain and stiffness.  Gastrointestinal: Negative for abdominal pain, bowel incontinence, diarrhea and excessive appetite.  Genitourinary: Negative for decreased libido, genital sores and incomplete emptying.  Neurological: Negative for brief paralysis, focal weakness, headaches and loss of balance.  Psychiatric/Behavioral: Negative for altered mental status, depression and suicidal ideas.  Allergic/Immunologic: Negative for HIV exposure and persistent infections.    EKGs/Labs/Other Studies Reviewed:    The following studies were reviewed today:   EKG: EKG not performed today, but I did review her EKG from November 12 showing sinus bradycardia, with nonspecific T wave changes.  Recent Labs: No results found for requested labs within last 365 days.  Recent Lipid Panel No results found for: "CHOL", "TRIG", "HDL", "CHOLHDL", "VLDL", "LDLCALC", "LDLDIRECT"  Physical Exam:    VS:  BP 120/70   Pulse 63   Ht '5\' 11"'$  (1.803 m)   Wt 237 lb 9.6 oz (107.8 kg)   SpO2 99%   BMI 33.14 kg/m     Wt Readings from Last 3 Encounters:  04/07/22 237 lb 9.6 oz (107.8 kg)  12/18/12 230 lb (104.3 kg)  12/19/11 209 lb (94.8 kg)     GEN: Well nourished, well developed in no acute distress HEENT: Normal NECK: No JVD; No carotid bruits LYMPHATICS: No lymphadenopathy CARDIAC: S1S2 noted,RRR, no murmurs, rubs, gallops RESPIRATORY:  Clear to auscultation without rales, wheezing or rhonchi  ABDOMEN: Soft, non-tender, non-distended, +bowel sounds, no guarding. EXTREMITIES: No edema, No cyanosis, no clubbing MUSCULOSKELETAL:  No deformity  SKIN: Warm and dry NEUROLOGIC:  Alert and oriented x 3, non-focal PSYCHIATRIC:  Normal affect, good insight  ASSESSMENT:     1. Precordial pain   2. Pre-procedural laboratory examination   3. Primary hypertension   4. Obesity (BMI 30-39.9)   5. Mixed hyperlipidemia    PLAN:    The symptoms chest pain is concerning, this patient does have intermediate risk for coronary artery disease and at this time I would like to pursue an ischemic evaluation in this patient.  Shared decision a coronary CTA at this time is appropriate.  I have discussed with the patient about the testing.  The patient has no IV contrast allergy and is agreeable to proceed with this test.  Blood pressure is acceptable, continue with current antihypertensive regimen.  I reviewed her lipid profile which was done February 22, 2022, total cholesterol 209, HDL 56, LDL 138, triglyceride 80.  She has not been started on lipid-lowering agents.  Discussed if coronary artery disease was started patient with appropriate lipid-lowering agents.  The patient understands the need to lose weight with diet and  exercise. We have discussed specific strategies for this.  The patient is in agreement with the above plan. The patient left the office in stable condition.  The patient will follow up in 4 months.   Medication Adjustments/Labs and Tests Ordered: Current medicines are reviewed at length with the patient today.  Concerns regarding medicines are outlined above.  Orders Placed This Encounter  Procedures   CT CORONARY MORPH W/CTA COR W/SCORE W/CA W/CM &/OR WO/CM   Basic Metabolic Panel (BMET)   Meds ordered this encounter  Medications   metoprolol tartrate (LOPRESSOR) 25 MG tablet    Sig: Take 1 tablet (25 mg total) by mouth once for 1 dose.    Dispense:  1 tablet    Refill:  0    Patient Instructions  Medication Instructions:  Your physician recommends that you continue on your current medications as directed. Please refer to the Current Medication list given to you today.  *If you need a refill on your cardiac medications before your next  appointment, please call your pharmacy*   Lab Work: Your physician recommends that you have the following lab drawn today BMET  If you have labs (blood work) drawn today and your tests are completely normal, you will receive your results only by: [place] (if you have MyChart) OR A paper copy in the mail If you have any lab test that is abnormal or we need to change your treatment, we will call you to review the results.   Testing/Procedures: Cardiac CT Angiography (CTA), is a special type of CT scan that uses a computer to produce multi-dimensional views of major blood vessels throughout the body. In CT angiography, a contrast material is injected through an IV to help visualize the blood vessels    Follow-Up: At [facility], you and your health needs are our priority.  As part of our continuing mission to provide you with exceptional heart care, we have created designated Provider Care Teams.  These Care Teams include your primary Cardiologist (physician) and Advanced Practice Providers (APPs -  Physician Assistants and Nurse Practitioners) who all work together to provide you with the care you need, when you need it.  We recommend signing up for the patient portal called "MyChart".  Sign up information is provided on this After Visit Summary.  MyChart is used to connect with patients for Virtual Visits (Telemedicine).  Patients are able to view lab/test results, encounter notes, upcoming appointments, etc.  Non-urgent messages can be sent to your provider as well.   To learn more about what you can do with MyChart, go to [email or web address].    Your next appointment:   4 month(s)  The format for your next appointment:   In Person  Provider:   Berniece Salines, DO     Other Instructions   Your cardiac CT will be scheduled at one of the below locations:   [facility] [address] [place], [place] [address] [phone]  If scheduled at [facility], please arrive at the [facility] and Children's Entrance (Entrance C2) of [facility] 30 minutes prior to test start time. You can use the FREE valet parking offered at entrance C (encouraged to control the heart rate for the test)  Proceed to the [facility] Radiology Department (first floor) to check-in and test prep.  All radiology patients and guests should use entrance C2 at [facility], accessed from [facility], even though the hospital's physical address listed  is [address]    On the Night Before the Test: Be sure to Drink plenty of water. Do not consume any caffeinated/decaffeinated beverages or chocolate 12 hours prior to your test. Do not take any antihistamines 12 hours prior to your test.  On the Day of the Test: Drink plenty of water until 1 hour prior to the test. Do not eat any food 1 hour prior to test. You may take your regular medications prior to the test.  Take metoprolol (Lopressor) '25mg'$  two hours prior to test. HOLD Hydrochlorothiazide morning of the test. FEMALES- please wear underwire-free bra if available, avoid dresses & tight clothing       After the Test: Drink plenty of water. After receiving IV contrast, you may experience a mild flushed feeling. This is normal. On occasion, you may experience a mild rash up to 24 hours after the test. This is not dangerous. If this occurs, you can take Benadryl 25 mg and increase your fluid intake. If you experience trouble breathing, this can be serious. If it is severe call 911 IMMEDIATELY. If it is mild, please call our office. If you take any of these medications: Glipizide/Metformin, Avandament, Glucavance, please do not take 48 hours after completing test unless otherwise instructed.  We will call to schedule your test 2-4 weeks out understanding that some insurance companies will need an authorization prior to the service being performed.   For non-scheduling  related questions, please contact the cardiac imaging nurse navigator should you have any questions/concerns: [name], Cardiac Imaging Nurse Navigator Gordy Clement, Cardiac Imaging Nurse Navigator [facility] Direct Office Dial: [phone]   For scheduling needs, including cancellations and rescheduling, please call [place], [phone].   Important Information About Sugar         Adopting a Healthy Lifestyle.  Know what a healthy weight is for you (roughly BMI <25) and aim to maintain this   Aim for 7+ servings of fruits and vegetables daily   65-80+ fluid ounces of water or unsweet tea for healthy kidneys   Limit to max 1 drink of alcohol per day; avoid smoking/tobacco   Limit animal fats in diet for cholesterol and heart health - choose grass fed whenever available   Avoid highly processed foods, and foods high in saturated/trans fats   Aim for low stress - take time to unwind and care for your mental health   Aim for 150 min of moderate intensity exercise weekly for heart health, and weights twice weekly for bone health   Aim for 7-9 hours of sleep daily   When it comes to diets, agreement about the perfect plan isnt easy to find, even among the experts. Experts at the [employer] developed an idea known as the Healthy Eating Plate. Just imagine a plate divided into logical, healthy portions.   The emphasis is on diet quality:   Load up on vegetables and fruits - one-half of your plate: Aim for color and variety, and remember that potatoes dont count.   Go for whole grains - one-quarter of your plate: Whole wheat, barley, wheat berries, quinoa, oats, brown rice, and foods made with them. If you want pasta, go with whole wheat pasta.   Protein power - one-quarter of your plate: Fish, chicken, beans, and nuts are all healthy, versatile protein sources. Limit red meat.   The diet, however, does go beyond the  plate, offering a few other suggestions.  Use healthy plant oils, such as olive, canola, soy, corn, sunflower and peanut. Check the labels, and avoid partially hydrogenated oil, which have unhealthy trans fats.   If youre thirsty, drink water. Coffee and tea are good in moderation, but skip sugary drinks and limit milk and dairy products to one or two daily servings.   The type of carbohydrate in the diet is more important than the amount. Some sources of carbohydrates, such as vegetables, fruits, whole grains, and beans-are healthier than others.   Finally, stay active  Signed, Berniece Salines, DO  04/07/2022 8:38 AM    [facility]

## 2022-04-07 NOTE — Patient Instructions (Signed)
Medication Instructions:  Your physician recommends that you continue on your current medications as directed. Please refer to the Current Medication list given to you today.  *If you need a refill on your cardiac medications before your next appointment, please call your pharmacy*   Lab Work: Your physician recommends that you have the following lab drawn today BMET  If you have labs (blood work) drawn today and your tests are completely normal, you will receive your results only by: [place] (if you have MyChart) OR A paper copy in the mail If you have any lab test that is abnormal or we need to change your treatment, we will call you to review the results.   Testing/Procedures: Cardiac CT Angiography (CTA), is a special type of CT scan that uses a computer to produce multi-dimensional views of major blood vessels throughout the body. In CT angiography, a contrast material is injected through an IV to help visualize the blood vessels    Follow-Up: At [facility], you and your health needs are our priority.  As part of our continuing mission to provide you with exceptional heart care, we have created designated Provider Care Teams.  These Care Teams include your primary Cardiologist (physician) and Advanced Practice Providers (APPs -  Physician Assistants and Nurse Practitioners) who all work together to provide you with the care you need, when you need it.  We recommend signing up for the patient portal called "MyChart".  Sign up information is provided on this After Visit Summary.  MyChart is used to connect with patients for Virtual Visits (Telemedicine).  Patients are able to view lab/test results, encounter notes, upcoming appointments, etc.  Non-urgent messages can be sent to your provider as well.   To learn more about what you can do with MyChart, go to [email or web address].    Your next appointment:   4 month(s)  The format for your next appointment:   In  Person  Provider:   Berniece Salines, DO     Other Instructions   Your cardiac CT will be scheduled at one of the below locations:   [facility] [address] [place], [place] [address] [phone]  If scheduled at [facility], please arrive at the [facility] and Children's Entrance (Entrance C2) of [facility] 30 minutes prior to test start time. You can use the FREE valet parking offered at entrance C (encouraged to control the heart rate for the test)  Proceed to the [facility] Radiology Department (first floor) to check-in and test prep.  All radiology patients and guests should use entrance C2 at [facility], accessed from [facility], even though the hospital's physical address listed is [address].    On the Night Before the Test: Be sure to Drink plenty of water. Do not consume any caffeinated/decaffeinated beverages or chocolate 12 hours prior to your test. Do not take any antihistamines 12 hours prior to your test.  On the Day of the Test: Drink plenty of water until 1 hour prior to the test. Do not eat any food 1 hour prior to test. You may take your regular medications prior to the test.  Take metoprolol (Lopressor) '25mg'$  two hours prior to test. HOLD Hydrochlorothiazide morning of the test. FEMALES- please wear underwire-free bra if available, avoid dresses & tight clothing       After the Test: Drink plenty of water. After receiving IV contrast, you may experience a mild flushed feeling.  This is normal. On occasion, you may experience a mild rash up to 24 hours after the test. This is not dangerous. If this occurs, you can take Benadryl 25 mg and increase your fluid intake. If you experience trouble breathing, this can be serious. If it is severe call 911 IMMEDIATELY. If it is mild, please call our office. If you take any of these medications: Glipizide/Metformin, Avandament, Glucavance, please do not take  48 hours after completing test unless otherwise instructed.  We will call to schedule your test 2-4 weeks out understanding that some insurance companies will need an authorization prior to the service being performed.   For non-scheduling related questions, please contact the cardiac imaging nurse navigator should you have any questions/concerns: [name], Cardiac Imaging Nurse Navigator Gordy Clement, Cardiac Imaging Nurse Navigator [facility] Direct Office Dial: [phone]   For scheduling needs, including cancellations and rescheduling, please call [place], [phone].   Important Information About Sugar

## 2022-04-09 ENCOUNTER — Ambulatory Visit
Admission: RE | Admit: 2022-04-09 | Discharge: 2022-04-09 | Disposition: A | Discharge status: Home / Self Care | Payer: No Typology Code available for payment source | Source: Ambulatory Visit | Attending: Physician Assistant | Admitting: Physician Assistant

## 2022-04-09 DIAGNOSIS — R519 Headache, unspecified: Secondary | ICD-10-CM

## 2022-04-12 ENCOUNTER — Other Ambulatory Visit: Payer: Self-pay

## 2022-04-12 ENCOUNTER — Emergency Department (HOSPITAL_COMMUNITY): Payer: No Typology Code available for payment source

## 2022-04-12 ENCOUNTER — Encounter (HOSPITAL_COMMUNITY): Payer: Self-pay | Admitting: Emergency Medicine

## 2022-04-12 ENCOUNTER — Emergency Department (HOSPITAL_COMMUNITY)
Admission: EM | Admit: 2022-04-12 | Discharge: 2022-04-13 | Disposition: A | Discharge status: Home / Self Care | Payer: BC Managed Care – PPO | Attending: Emergency Medicine | Admitting: Emergency Medicine

## 2022-04-12 ENCOUNTER — Other Ambulatory Visit: Payer: Self-pay | Admitting: Physical Medicine and Rehabilitation

## 2022-04-12 DIAGNOSIS — Z79899 Other long term (current) drug therapy: Secondary | ICD-10-CM | POA: Insufficient documentation

## 2022-04-12 DIAGNOSIS — M542 Cervicalgia: Secondary | ICD-10-CM | POA: Insufficient documentation

## 2022-04-12 DIAGNOSIS — E876 Hypokalemia: Secondary | ICD-10-CM | POA: Insufficient documentation

## 2022-04-12 DIAGNOSIS — E039 Hypothyroidism, unspecified: Secondary | ICD-10-CM | POA: Insufficient documentation

## 2022-04-12 DIAGNOSIS — R519 Headache, unspecified: Secondary | ICD-10-CM | POA: Insufficient documentation

## 2022-04-12 DIAGNOSIS — G932 Benign intracranial hypertension: Secondary | ICD-10-CM

## 2022-04-12 DIAGNOSIS — M316 Other giant cell arteritis: Secondary | ICD-10-CM

## 2022-04-12 LAB — CBC
HCT: 44.9 % (ref 36.0–46.0)
Hemoglobin: 14.2 g/dL (ref 12.0–15.0)
MCH: 28.7 pg (ref 26.0–34.0)
MCHC: 31.6 g/dL (ref 30.0–36.0)
MCV: 90.9 fL (ref 80.0–100.0)
Platelets: 369 10*3/uL (ref 150–400)
RBC: 4.94 MIL/uL (ref 3.87–5.11)
RDW: 12.9 % (ref 11.5–15.5)
WBC: 7.5 10*3/uL (ref 4.0–10.5)
nRBC: 0 % (ref 0.0–0.2)

## 2022-04-12 LAB — CBG MONITORING, ED: Glucose-Capillary: 96 mg/dL (ref 70–99)

## 2022-04-12 LAB — BASIC METABOLIC PANEL
Anion gap: 10 (ref 5–15)
BUN: 10 mg/dL (ref 6–20)
CO2: 31 mmol/L (ref 22–32)
Calcium: 9.1 mg/dL (ref 8.9–10.3)
Chloride: 101 mmol/L (ref 98–111)
Creatinine, Ser: 1.04 mg/dL — ABNORMAL HIGH (ref 0.44–1.00)
GFR, Estimated: >60 mL/min (ref >60)
Glucose, Bld: 109 mg/dL — ABNORMAL HIGH (ref 70–99)
Potassium: 3.1 mmol/L — ABNORMAL LOW (ref 3.5–5.1)
Sodium: 142 mmol/L (ref 135–145)

## 2022-04-12 LAB — C-REACTIVE PROTEIN: CRP: 2.2 mg/dL — ABNORMAL HIGH (ref <1.0)

## 2022-04-12 MED ORDER — BUTALBITAL-APAP-CAFFEINE 50-325-40 MG PO TABS: 1.0000 | ORAL_TABLET | Freq: Four times a day (QID) | ORAL | 0 refills | Status: DC | PRN

## 2022-04-12 MED ORDER — KETOROLAC TROMETHAMINE 15 MG/ML IJ SOLN
15.0000 mg | Freq: Once | INTRAMUSCULAR | Status: AC
  Administered 2022-04-12: 15 mg via INTRAVENOUS
  Filled 2022-04-12: qty 1

## 2022-04-12 MED ORDER — PROCHLORPERAZINE EDISYLATE 10 MG/2ML IJ SOLN
10.0000 mg | Freq: Once | INTRAMUSCULAR | Status: AC
  Administered 2022-04-12: 10 mg via INTRAVENOUS
  Filled 2022-04-12: qty 2

## 2022-04-12 MED ORDER — LACTATED RINGERS IV BOLUS
1000.0000 mL | Freq: Once | INTRAVENOUS | Status: AC
  Administered 2022-04-12: 1000 mL via INTRAVENOUS

## 2022-04-12 MED ORDER — DEXAMETHASONE SODIUM PHOSPHATE 10 MG/ML IJ SOLN
10.0000 mg | Freq: Once | INTRAMUSCULAR | Status: AC
  Administered 2022-04-12: 10 mg via INTRAVENOUS
  Filled 2022-04-12: qty 1

## 2022-04-12 MED ORDER — DIAZEPAM 5 MG/ML IJ SOLN
5.0000 mg | Freq: Once | INTRAMUSCULAR | Status: AC
  Administered 2022-04-12: 5 mg via INTRAVENOUS
  Filled 2022-04-12: qty 2

## 2022-04-12 MED ORDER — IOHEXOL 350 MG/ML SOLN
75.0000 mL | Freq: Once | INTRAVENOUS | Status: AC | PRN
  Administered 2022-04-12: 75 mL via INTRAVENOUS

## 2022-04-12 MED ORDER — POTASSIUM CHLORIDE CRYS ER 20 MEQ PO TBCR
40.0000 meq | EXTENDED_RELEASE_TABLET | Freq: Once | ORAL | Status: AC
  Administered 2022-04-12: 40 meq via ORAL
  Filled 2022-04-12: qty 2

## 2022-04-12 MED ORDER — BUTALBITAL-APAP-CAFFEINE 50-325-40 MG PO TABS
1.0000 | ORAL_TABLET | Freq: Once | ORAL | Status: AC
  Administered 2022-04-12: 1 via ORAL

## 2022-04-12 MED ORDER — BUTALBITAL-APAP-CAFFEINE 50-325-40 MG PO TABS
ORAL_TABLET | ORAL | Status: AC
  Filled 2022-04-12: qty 1

## 2022-04-12 MED ORDER — DIPHENHYDRAMINE HCL 50 MG/ML IJ SOLN
12.5000 mg | Freq: Once | INTRAMUSCULAR | Status: AC
  Administered 2022-04-12: 12.5 mg via INTRAVENOUS
  Filled 2022-04-12: qty 1

## 2022-04-12 NOTE — ED Notes (Signed)
Patient is in room, pacing the floor stating she does not want to lay down in the stretcher.

## 2022-04-12 NOTE — ED Notes (Signed)
Assumed care of this patient.

## 2022-04-12 NOTE — ED Notes (Signed)
Patient is refusing lumbar puncture at this time. States she is having spasms and is unable to sit or lay for long periods.

## 2022-04-12 NOTE — Discharge Instructions (Addendum)
You have been seen in the Emergency Department (ED) for a headache.  As we discussed, your imaging did not show any evidence of aneurysm or head bleed however did show changes concerning for idiopathic intracranial hypertension.  It is important you take the acetazolamide 500 mg 2 times a day.  It is also very important you make an appointment with the neurologist as well as the ophthalmologist (eye doctor) listed in your discharge paperwork.  You can continue to take ibuprofen and your home pain meds for pain.  I have also prescribed you Fioricet for pain management.  Call your doctor or return to the ED if you have a severe worsening headache, sudden and severe headache, confusion, slurred speech, facial droop, fainting spells, loss of vision, worsening or new weakness or numbness in any arm or leg, extreme fatigue, or other symptoms that concern you.

## 2022-04-12 NOTE — ED Provider Triage Note (Signed)
Emergency Medicine Provider Triage Evaluation Note  Teresa Kent , a 56 y.o. female  was evaluated in triage.  Pt complains of numbness on her left foot and right thigh that started last night.  Patient also reports chronic headache and neck pain.  States her vision has become blurry.  No fever, chest pain, shortness of breath, dizziness.  Review of Systems  Positive: As above Negative: As above  Physical Exam  BP 121/83 (BP Location: Right Arm)   Pulse 80   Temp 98.3 F (36.8 C) (Oral)   Resp 16   Ht '5\' 11"'$  (1.803 m)   Wt 107 kg   SpO2 100%   BMI 32.90 kg/m  Gen:   Awake, no distress   Resp:  Normal effort  MSK:   Moves extremities without difficulty  Other:    Medical Decision Making  Medically screening exam initiated at 4:33 PM.  Appropriate orders placed.  Libbie E Kolenovic was informed that the remainder of the evaluation will be completed by another provider, this initial triage assessment does not replace that evaluation, and the importance of remaining in the ED until their evaluation is complete.     Rex Kras, [place] 04/12/22 2127

## 2022-04-12 NOTE — ED Notes (Signed)
Back from CT

## 2022-04-12 NOTE — ED Triage Notes (Signed)
Pt recently had MRI due to right leg numbness and left foot numbness. Pt also having head pressure for over a month.

## 2022-04-12 NOTE — ED Notes (Signed)
Patient is back from CT unable to do the ct due to tremors.

## 2022-04-12 NOTE — ED Notes (Signed)
Patient went to CT

## 2022-04-12 NOTE — Consult Note (Addendum)
NEUROLOGY CONSULTATION NOTE   Date of service: April 12, 2022 Patient Name: Teresa Kent MRN:  [id number] DOB:  Jun 17, 1965 Reason for consult: "Headache, blurred vision, intermittently dropping things from hands, MRI concerning for IIH  " Requesting Provider: Elgie Congo, MD _ _ _   _ __   _ __ _ _  __ __   _ __   __ _  History of Present Illness  Teresa Kent is a 56 y.o. female with PMH significant for HTN, Hypothyroidism, endometrial hyperlplasia, obesity who presents with left sided headache x 1 month, BL blurred vision, along with 1 day hx of R leg numbness and L foot numbness.  Patient reports that about a month ago she was sitting at her desk when out of nowhere she had sudden onset 8 out of 10 headache that was maximal within a couple minutes of onset.  She describes this as a sharp pain in the back of her head and going into her neck along with some neck stiffness.  This lasted about an hour to and she took some Tylenol and that brought the headache down. She denies any personal or family hx of brain aneurysms. She reports that since then, she that off-and-on headaches over the last month along with sensation of pressure in her head.  She describes that is predominantly the left side of her head that hurts including left temple, the left occipit and vertex that hurt.  She describes this as a dull 5 out of 10 pain with no tenderness to palpation, no jaw claudication, no fever.  She reports that lying flat make this headache worse, she has not noticed any change in the headache intensity with cough or with bearing down.  She reports that she is someone who never has headaches and has not had any significant headaches prior to this.  She is not on any vitamin A supplements or retinoic acid creams.  She is not on birth control.  She denies any prior history of clots.  Endorses that her brother has sarcoidosis.  No recent travel outside the country and does not work with elderly  population.  No exposure to tuberculous that she is aware of.  She denies any photophobia, no nausea, no vomiting.  She does endorse that she has noticed that she is more tremulous since the onset of these headaches.  She also has blurred vision with left eye worse than right and something appear out of focus. She also endorses that she has muscle cramps all over.  She saw her PCP for generalized pain but also R leg numbness and L foot numbness. She got outpatient MRI Brain which demonstrated findings concerning for IIH.  She reports that her PCP has tried Tizanidine for the muscle cramps, gabapentin which have not been particularly helpful. They tried prednisone which she reports helped a lot.  She also reports having lost about 50 lbs recently.  Reports PCP obtained outpatient ESR and CRP and that was elevated. I unfortunately, am unable to access the ESR and CRP.  Also endorses that she rarely eats meat, prefers to eat vegetables and maybe a little bit of dairy. Takes multivitamin most days thou.   ROS   Constitutional Denies weight loss, fever and chills.   HEENT Denies changes in vision and hearing.   Respiratory Denies SOB and cough.   CV Denies palpitations and CP   GI Denies abdominal pain, nausea, vomiting and diarrhea.   GU Denies dysuria and urinary  frequency.   MSK Denies myalgia and joint pain.   Skin Denies rash and pruritus.   Neurological + headache but no syncope.   Psychiatric Denies recent changes in mood. Denies anxiety and depression.    Past History   Past Medical History:  Diagnosis Date   Abnormal Pap smear 02/2003   Anemia    heavy menses   Endometriosis 02/2004   Fibroids, submucosal 02/2004   H/O: menorrhagia 02/2004   Hypertension    Hypothyroidism    Increased BMI 10/11/10   Ovarian cyst, right 03/19/2004   Simple endometrial hyperplasia 02/2004   Past Surgical History:  Procedure Laterality Date   DILATION AND CURETTAGE OF UTERUS  03/19/2004    DILITATION & CURRETTAGE/HYSTROSCOPY WITH NOVASURE ABLATION N/A 12/20/2012   Procedure: DILATATION & CURETTAGE/HYSTEROSCOPY WITH NOVASURE ABLATION;  Surgeon: Delice Lesch, MD;  Location: [facility];  Service: Gynecology;  Laterality: N/A;   OVARY SURGERY     resection of fibroid  03/19/2004   THYROID SURGERY     TUBAL LIGATION     Family History  Problem Relation Age of Onset   Breast cancer Maternal Grandmother    Social History   Socioeconomic History   Marital status: Single    Spouse name: Not on file   Number of children: Not on file   Years of education: Not on file   Highest education level: Not on file  Occupational History   Not on file  Tobacco Use   Smoking status: Never   Smokeless tobacco: Not on file  Substance and Sexual Activity   Alcohol use: No   Drug use: No   Sexual activity: Never    Birth control/protection: Surgical  Other Topics Concern   Not on file  Social History Narrative   Not on file   Social Determinants of Health   Financial Resource Strain: Not on file  Food Insecurity: Not on file  Transportation Needs: Not on file  Physical Activity: Not on file  Stress: Not on file  Social Connections: Not on file   Allergies  Allergen Reactions   Prilosec [Omeprazole]    Simvastatin     Medications  (Not in a hospital admission)    Vitals   Vitals:   04/12/22 1557 04/12/22 1558 04/12/22 1830 04/12/22 1930  BP:  121/83 119/78 118/83  Pulse:  80 71 78  Resp:  _0 Temp:  98.3 F (36.8 C) 98 F (36.7 C)   TempSrc:  Oral    SpO2:  100% 100% 100%  Weight: 107 kg     Height: _1  (1.803 m)        Body mass index is 32.9 kg/m.  Physical Exam   General: Laying comfortably in bed; in no acute distress.  HENT: Normal oropharynx and mucosa. Normal external appearance of ears and nose.  Neck: Supple, no pain or tenderness  CV: No JVD. No peripheral edema.  Pulmonary: Symmetric Chest rise. Normal respiratory effort.  Abdomen:  Soft to touch, non-tender.  Ext: No cyanosis, edema, or deformity  Skin: No rash. Normal palpation of skin.   Musculoskeletal: Normal digits and nails by inspection. No clubbing.   Neurologic Examination  Mental status/Cognition: Alert, oriented to self, place, month and year, good attention.  Speech/language: Fluent, comprehension intact, object naming intact, repetition intact.  Cranial nerves:   CN II Pupils equal and reactive to light, no VF deficits    CN III,IV,VI EOM intact, no gaze preference or deviation,  no nystagmus    CN V normal sensation in V1, V2, and V3 segments bilaterally    CN VII no asymmetry, no nasolabial fold flattening    CN VIII normal hearing to speech    CN IX & X normal palatal elevation, no uvular deviation    CN XI 5/5 head turn and 5/5 shoulder shrug bilaterally    CN XII midline tongue protrusion    No neck stiffness or tenderness to palpation. No neck pain with active and full neck range of motion.  Motor:  Muscle bulk: normal, tone normal, pronator drift normal tremor none Mvmt Root Nerve  Muscle Right Left Comments  SA C5/6 Ax Deltoid 5 5   EF C5/6 Mc Biceps 5 5   EE C6/7/8 Rad Triceps 5 5   WF C6/7 Med FCR     WE C7/8 PIN ECU     F Ab C8/T1 U ADM/FDI 5 5   HF L1/2/3 Fem Illopsoas 5 5   KE L2/3/4 Fem Quad 5 5   DF L4/5 D Peron Tib Ant 5 5   PF S1/2 Tibial Grc/Sol 5 5    Reflexes:  Right Left Comments  Pectoralis      Biceps (C5/6) 2 2   Brachioradialis (C5/6) 2 2    Triceps (C6/7) 2 2    Patellar (L3/4) 2 2    Achilles (S1)      Hoffman      Plantar     Jaw jerk    Sensation:  Light touch Mild hyperasthesia to touch in left face. Otherwise, intact.   Pin prick    Temperature    Vibration Decreased in BL feet  Proprioception Decreased in BL big toes.   Coordination/Complex Motor:  - Finger to Nose intact BL - Heel to shin intact BL - Rapid alternating movement are normal - Gait: deferred.  Labs   CBC:  Recent Labs  Lab  04/12/22 1639  WBC 7.5  HGB 14.2  HCT 44.9  MCV 90.9  PLT 417    Basic Metabolic Panel:  Lab Results  Component Value Date   NA 142 04/12/2022   K 3.1 (L) 04/12/2022   CO2 31 04/12/2022   GLUCOSE 109 (H) 04/12/2022   BUN 10 04/12/2022   CREATININE 1.04 (H) 04/12/2022   CALCIUM 9.1 04/12/2022   GFRNONAA >60 04/12/2022   GFRAA >90 12/18/2012   Lipid Panel: No results found for: "LDLCALC" HgbA1c: No results found for: "HGBA1C" Urine Drug Screen: No results found for: "LABOPIA", "COCAINSCRNUR", "LABBENZ", "AMPHETMU", "THCU", "LABBARB"  Alcohol Level No results found for: "ETH"  CT angio Head with and without contrast along with delayed CT angio(CT Venogram): pending  MRI Brain(Personally reviewed): 1. 5.5 mm T2 hyperintense lesion of the left cerebellum with a peripheral hemosiderin ring. 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 2. Empty expanded sella, prominence of CSF ventral to the pons and prominent CSF along the optic nerves all raises concern for idiopathic intracranial hypertension. 3. No acute intracranial abnormality.  Impression   Teresa Kent is a 56 y.o. female with PMH significant for HTN, Hypothyroidism, endometrial hyperlplasia, obesity who presents with left sided headache x 1 month, BL blurred vision, along with 1 day hx of R leg numbness and L foot numbness. Headache seems to have features of elevated ICP, MRI also concerning for potential IIH. She does report sudden onset headache, maximal within a minute or 2 when she initially had the headache a month ago. Question if she  could have had a potential SAH at that time.  However, also reports muscle aches, pain and cramps that steroids helped with and pain in the left temple and occiput. Potential PMR with temporal arteritis? Reports PCP tested for ESR and CRP which was elevated. However, no jaw claudication, no temporal tenderness to palpation, no fever, no temporal artery prominence and  headache does not seem classic for GCA, no threatened vision and thus hold off on steroids.  Recommendations  - CT angio head to evaluate for aneurysms along with delayed CT angio(CT Venogram) to look for dural sinus venous thrombosis. - serum ESR and CRP to evaluate for GCA. If elevated, consider getting Vas [place] temporal artery to look for halo sign for potential GCA. Will also test for ANA and ANCA titers to look for PMR. - serum B12, folate, TSH - ?neuropathy with decreased vibratory and proprioception. - LP with opening pressure, CSF cell count and diff x 2. Meningitis/encephalitis panel, CSF protein and glucose, serum and CSF ACE(brother diagnosed with sarcoidosis), OCB and IgG index. - Started by PCP on Acetazolamide 574m BID. Agree with this. Consider increasing dose weekly by 5013mto a max dose of 4G/day. Recommend checking chemistry periodically while titrating Acetazolamide, can consider adding topamax or Lasix as an adjunct as tolerated outpatient. - follow up with neurology. - follow up with ophthalmology for dilated eye exam outpatient to monitor for optic disc edema and potential need for fenestration is needed. ______________________________________________________________________   Thank you for the opportunity to take part in the care of this patient. If you have any further questions, please contact the neurology consultation attending.  Signed,  [place] [phone] _ _   _ __   _ __ _ _  __ __   _ __   __ _

## 2022-04-12 NOTE — ED Notes (Signed)
Patient ambulated to the bathroom.

## 2022-04-12 NOTE — ED Notes (Signed)
Dr Milas Gain at bedside speaking with patient.

## 2022-04-12 NOTE — ED Notes (Signed)
TO CT

## 2022-04-12 NOTE — ED Notes (Signed)
Ct came to get the patient for scan, patient stated she wanted to leave and not have the scan. Spoke with the patient and assisted her to the bathroom. Patient states she can do the scan if she can walk to CT. Does not want to lay in the stretcher.

## 2022-04-12 NOTE — ED Provider Notes (Signed)
[facility] DEPARTMENT Provider Note   CSN: [id number] Arrival date & time: 04/12/22  1538     History  Chief Complaint  Patient presents with   Headache   Numbness    Teresa Kent is a 56 y.o. female.  With PMH of hidradenitis, hypertension, fibroids, hypothyroid who presents with bad headache that has been worsening over the past month.  It was sudden in onset about a month ago.  She feels like it is pressure-like mainly in the back left side of her head but is also started moving to the right side of her head.  Is been associated with blurred vision in both eyes that is persistent of closing 1 eye.  Is not diplopia and there are no visual field cuts.  She has had no unsteadiness or falls.  She endorses numbness in both lower extremities and intermittent weakness in both hands dropping things incidentally.  She has had no fevers, vomiting, confusion or URI symptoms.  She also endorses some neck pain.  Denies any recent falls or injuries.  Was seen by her PCP who ordered an MRI and was concern for buildup of fluid in her head.  She came here because she is not able to see a neurologist and also having persistent worsening symptoms.  She says it is worse when lying flat.  No CP or SOB.   Headache      Home Medications Prior to Admission medications   Medication Sig Start Date End Date Taking? Authorizing Provider  butalbital-acetaminophen-caffeine (FIORICET) 50-325-40 MG tablet Take 1-2 tablets by mouth every 6 (six) hours as needed for up to 20 doses for headache. 04/12/22  Yes Elgie Congo, MD  gabapentin (NEURONTIN) 100 MG capsule Take 100-300 mg by mouth at bedtime. 01/27/22   [provider]  ibuprofen (ADVIL,MOTRIN) 600 MG tablet Take 1 tablet (600 mg total) by mouth every 6 (six) hours as needed for pain. 12/20/12   Linda Hedges, CNM  levothyroxine (SYNTHROID, LEVOTHROID) 200 MCG tablet Take 200 mcg by mouth daily.      [provider]  metoprolol tartrate (LOPRESSOR) 25 MG tablet Take 1 tablet (25 mg total) by mouth once for 1 dose. 04/07/22 04/07/22  Tobb, Godfrey Pick, DO  Multiple Vitamin (MULTIVITAMIN) tablet Take 1 tablet by mouth 2 (two) times daily.      [provider]  predniSONE (STERAPRED UNI-PAK 21 TAB) 10 MG (21) TBPK tablet Take by mouth as directed. 03/29/22   [provider]  tiZANidine (ZANAFLEX) 4 MG tablet Take 1 tablet (4 mg total) by mouth at bedtime. 04/03/22   Jaynee Eagles, PA-C  triamterene-hydrochlorothiazide (MAXZIDE) 75-50 MG per tablet Take 0.5 tablets by mouth daily.      [provider]      Allergies    Prilosec [omeprazole] and Simvastatin    Review of Systems   Review of Systems  Neurological:  Positive for headaches.    Physical Exam Updated Vital Signs BP 119/81   Pulse (!) 106   Temp 98 F (36.7 C)   Resp (!) 21   Ht '5\' 11"'$  (1.803 m)   Wt 107 kg   SpO2 99%   BMI 32.90 kg/m  Physical Exam Constitutional: Alert and oriented.  No acute distress and nontoxic Eyes: Conjunctivae are normal. ENT      Head: Normocephalic and atraumatic.      Nose: No congestion.      Mouth/Throat: Mucous membranes are moist.  Neck: No stridor.  Mild left cervical paraspinal tenderness.  No midline tenderness no step-offs, no deformities, no meningismus Cardiovascular: S1, S2, regular rate and rhythm equal palpable PT pulses and radial pulses Respiratory: Normal respiratory effort. Breath sounds are normal. Gastrointestinal: Soft Musculoskeletal: Normal range of motion in all extremities. Neurologic: Normal speech and language without aphasia. AAOx4. PERRL. EOMI without nystagmus. Face symmetrical without droop. CN II-XII intact. Normal facial sensation. Tongue midline.  5/5 strength in upper and lower extremities.  Reported decreased sensation of bilateral lower extremities of the feet as well as subjective decreased sensation of left upper extremity normal  finger-to-nose.  Skin: Skin is warm, dry and intact. No rash noted. Psychiatric: Mood and affect are normal. Speech and behavior are normal.   ED Results / Procedures / Treatments   Labs (all labs ordered are listed, but only abnormal results are displayed) Labs Reviewed  BASIC METABOLIC PANEL - Abnormal; Notable for the following components:      Result Value   Potassium 3.1 (*)    Glucose, Bld 109 (*)    Creatinine, Ser 1.04 (*)    All other components within normal limits  CBC  SEDIMENTATION RATE  C-REACTIVE PROTEIN  VITAMIN B12  FOLATE  TSH  ANGIOTENSIN CONVERTING ENZYME  ANGIOTENSIN CONVERTING ENZYME, CSF  CSF CELL COUNT WITH DIFFERENTIAL  CSF CELL COUNT WITH DIFFERENTIAL  ANCA TITERS  ANTINUCLEAR ANTIBODIES, IFA  PROTEIN AND GLUCOSE, CSF  MENINGITIS/ENCEPHALITIS PANEL (CSF)  OLIGOCLONAL BANDS, CSF + SERM  DRAW EXTRA CLOT TUBE  IGG CSF INDEX  DRAW EXTRA CLOT TUBE  IGG CSF INDEX  OLIGOCLONAL BANDS, CSF + SERM  CBG MONITORING, ED    EKG None  Radiology CT Angio Head W or Wo Contrast  Result Date: 04/12/2022 CLINICAL DATA:  Severe headache for 1 month EXAM: CT ANGIOGRAPHY HEAD CT VENOGRAM HEAD TECHNIQUE: Multidetector CT imaging of the head was performed using the standard protocol during bolus administration of intravenous contrast. Multiplanar CT image reconstructions and MIPs were obtained to evaluate the vascular anatomy. Venographic phase images of the brain were obtained following the administration of intravenous contrast. Multiplanar reformats and maximum intensity projections were generated. RADIATION DOSE REDUCTION: This exam was performed according to the departmental dose-optimization program which includes automated exposure control, adjustment of the mA and/or kV according to patient size and/or use of iterative reconstruction technique. CONTRAST:  60m OMNIPAQUE IOHEXOL 350 MG/ML SOLN COMPARISON:  No prior CT, CTA, or CT V of the head; correlation is made  with MRI head 04/09/2022 FINDINGS: CT HEAD Brain: No evidence of acute infarct, hemorrhage, mass, mass effect, or midline shift. No hydrocephalus or extra-axial fluid collection. 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 Partial empty sella, with expansion of the sella. Vascular: No hyperdense vessel. Skull: Normal. Negative for fracture or focal lesion. Sinuses/Orbits: Clear paranasal sinuses.  Tortuous optic nerves. Other: The mastoid air cells are well aerated. CTA HEAD FINDINGS Evaluation is somewhat limited by bolus timing. Anterior circulation: Both internal carotid arteries are patent to the termini, without significant stenosis. Mild calcified atherosclerosis in the left cavernous ICA. A1 segments patent. Normal anterior communicating artery. Anterior cerebral arteries are patent to their distal aspects. No M1 stenosis or occlusion. MCA branches perfused and symmetric. Posterior circulation: Vertebral arteries patent to the vertebrobasilar junction without significant stenosis. Apparent narrowing of the distal right V4 is likely related to the right PICA takeoff, which is  better seen on coronal sequences. Basilar patent to its distal aspect. Superior cerebellar arteries patent proximally. Patent P1 segments, diminutive the right. Near fetal origin of the right PCA from a prominent right posterior communicating artery. Evaluation of the PCAs is limited by venous contamination a; within this limitation, the PCAs appear perfused to the P3 segments. The left posterior communicating artery is not visualized. Venous sinuses: As permitted by contrast timing, patent. Anatomic variants: Near fetal origin of the right PCA. Review of the MIP images confirms the above findings CTV HEAD Superior sagittal sinus: Normal. Straight sinus: Normal. Inferior sagittal sinus, vein of Galen and internal cerebral veins:  Normal. Transverse sinuses: Normal. Sigmoid sinuses: Patent on the right. Possible filling defect in the proximal left sigmoid sinus (series 5, image 131), versus arachnoid granulation, with the left sigmoid sinus otherwise opacified. Narrowing of the bilateral sigmoid sinuses at the transverse sigmoid junction. Visualized jugular veins: Normal IMPRESSION: 1. Evaluation is somewhat limited by bolus timing. Within this limitation, no intracranial large vessel occlusion, significant stenosis, or aneurysm. 2. Possible filling defect versus arachnoid granulation in the proximal left sigmoid sinus, with the left sigmoid sinus otherwise opacified. If there is persistent clinical concern for venous sinus thrombosis, consider MRV with contrast. 3. Narrowing of the bilateral sigmoid sinuses at the transverse sigmoid junction, partial empty sella with sellar expansion, and optic nerve tortuosity, which together concerning for idiopathic intracranial hypertension. Correlate with symptoms and consider lumbar puncture with opening pressure. These results were called by telephone at the time of interpretation on 04/12/2022 at 11:04 pm to provider Georgina Snell , who verbally acknowledged these results. Electronically Signed   By: Merilyn Baba M.D.   On: 04/12/2022 23:05   CT VENOGRAM HEAD  Result Date: 04/12/2022 CLINICAL DATA:  Severe headache for 1 month EXAM: CT ANGIOGRAPHY HEAD CT VENOGRAM HEAD TECHNIQUE: Multidetector CT imaging of the head was performed using the standard protocol during bolus administration of intravenous contrast. Multiplanar CT image reconstructions and MIPs were obtained to evaluate the vascular anatomy. Venographic phase images of the brain were obtained following the administration of intravenous contrast. Multiplanar reformats and maximum intensity projections were generated. RADIATION DOSE REDUCTION: This exam was performed according to the departmental dose-optimization program which  includes automated exposure control, adjustment of the mA and/or kV according to patient size and/or use of iterative reconstruction technique. CONTRAST:  69m OMNIPAQUE IOHEXOL 350 MG/ML SOLN COMPARISON:  No prior CT, CTA, or CT V of the head; correlation is made with MRI head 04/09/2022 FINDINGS: CT HEAD Brain: No evidence of acute infarct, hemorrhage, mass, mass effect, or midline shift. No hydrocephalus or extra-axial fluid collection. 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 Partial empty sella, with expansion of the sella. Vascular: No hyperdense vessel. Skull: Normal. Negative for fracture or focal lesion. Sinuses/Orbits: Clear paranasal sinuses.  Tortuous optic nerves. Other: The mastoid air cells are well aerated. CTA HEAD FINDINGS Evaluation is somewhat limited by bolus timing. Anterior circulation: Both internal carotid arteries are patent to the termini, without significant stenosis. Mild calcified atherosclerosis in the left cavernous ICA. A1 segments patent. Normal anterior communicating artery. Anterior cerebral arteries are patent to their distal aspects. No M1 stenosis or occlusion. MCA branches perfused and symmetric. Posterior circulation: Vertebral arteries patent to the vertebrobasilar junction without significant stenosis. Apparent narrowing of the distal right V4 is likely related to the right PICA takeoff, which is better  seen on coronal sequences. Basilar patent to its distal aspect. Superior cerebellar arteries patent proximally. Patent P1 segments, diminutive the right. Near fetal origin of the right PCA from a prominent right posterior communicating artery. Evaluation of the PCAs is limited by venous contamination a; within this limitation, the PCAs appear perfused to the P3 segments. The left posterior communicating artery is not visualized. Venous sinuses: As permitted by  contrast timing, patent. Anatomic variants: Near fetal origin of the right PCA. Review of the MIP images confirms the above findings CTV HEAD Superior sagittal sinus: Normal. Straight sinus: Normal. Inferior sagittal sinus, vein of Galen and internal cerebral veins: Normal. Transverse sinuses: Normal. Sigmoid sinuses: Patent on the right. Possible filling defect in the proximal left sigmoid sinus (series 5, image 131), versus arachnoid granulation, with the left sigmoid sinus otherwise opacified. Narrowing of the bilateral sigmoid sinuses at the transverse sigmoid junction. Visualized jugular veins: Normal IMPRESSION: 1. Evaluation is somewhat limited by bolus timing. Within this limitation, no intracranial large vessel occlusion, significant stenosis, or aneurysm. 2. Possible filling defect versus arachnoid granulation in the proximal left sigmoid sinus, with the left sigmoid sinus otherwise opacified. If there is persistent clinical concern for venous sinus thrombosis, consider MRV with contrast. 3. Narrowing of the bilateral sigmoid sinuses at the transverse sigmoid junction, partial empty sella with sellar expansion, and optic nerve tortuosity, which together concerning for idiopathic intracranial hypertension. Correlate with symptoms and consider lumbar puncture with opening pressure. These results were called by telephone at the time of interpretation on 04/12/2022 at 11:04 pm to provider Georgina Snell , who verbally acknowledged these results. Electronically Signed   By: Merilyn Baba M.D.   On: 04/12/2022 23:05    Procedures Procedures   Medications Ordered in ED Medications  lactated ringers bolus 1,000 mL (0 mLs Intravenous Stopped 04/12/22 2158)  dexamethasone (DECADRON) injection 10 mg (10 mg Intravenous Given 04/12/22 2038)  ketorolac (TORADOL) 15 MG/ML injection 15 mg (15 mg Intravenous Given 04/12/22 2037)  prochlorperazine (COMPAZINE) injection 10 mg (10 mg Intravenous Given 04/12/22  2037)  diphenhydrAMINE (BENADRYL) injection 12.5 mg (12.5 mg Intravenous Given 04/12/22 2036)  potassium chloride SA (KLOR-CON M) CR tablet 40 mEq (40 mEq Oral Given 04/12/22 2042)  diazepam (VALIUM) injection 5 mg (5 mg Intravenous Given 04/12/22 2056)  iohexol (OMNIPAQUE) 350 MG/ML injection 75 mL (75 mLs Intravenous Contrast Given 04/12/22 2218)  butalbital-acetaminophen-caffeine (FIORICET) 50-325-40 MG per tablet 1 tablet (1 tablet Oral Given 04/12/22 2310)    ED Course/ Medical Decision Making/ A&P Clinical Course as of 04/12/22 2344  Tue Apr 12, 2022  2303 Spoke with on call radiologist, On CTV possible arachnoid granulation vs small filling defect, but also significant evidence of IIH. CTA showed no evidence of bleed. Suspects more likely all findings consistent with IIH.  She said that if we needed any further workup then get MRV. [VB]  2337 The inflammatory markers have not resulted yet I did discuss with patient that she may need to stay for a temporal artery ultrasound if they are elevated however she expresses not being able to wait any further and needs to take her family home. She is refusing LP at this time because she said it is physically impossible for her to lay still about long board at this time.  She would also prefer to get that outpatient. She would prefer to follow-up outpatient with neurology and ophthalmology.  She did get covered with IV Decadron 10 mg today.  She is prescribed  acetazolamide 500 mg twice daily by her PCP which she will start taking.  I put an urgent referrals to neurology and ophthalmology.  I have also prescribed her Fioricet as needed for headache.  Discussed strict return precautions.  She is in agreement with plan and safe for discharge at this time. [VB]    Clinical Course User Index [VB] Elgie Congo, MD                           Medical Decision Making Teresa Kent is a 56 y.o. female.  With PMH of hidradenitis, hypertension,  fibroids, hypothyroid who presents with bad headache that has been worsening over the past month.  Has been associated with blurry vision and sensory changes.  Of note, patient had MRI performed outpatient April 11, 2022 with findings concerning for cavernous malformation and IIH as listed below under external data.  I discussed case in depth with Dr. Lorrin Goodell on-call neurologist who also did a formal evaluation of patient and requested CTV to rule out venous sinus thrombosis as well as CTA to rule out aneurysm or bleed.  Both were obtained which showed no evidence of aneurysm or bleed.  There was questionable filling defect versus granulation tissue, favor granulation tissue based off exam.  LP was requested however declined by patient as noted in my clinical course notes.  She was also unable to wait for inflammatory markers/possible temporal artery ultrasound to rule out temporal arteritis.  She did receive IV Decadron 10 mg in the ED as well as migraine medications including IV fluids, Toradol, fioricet, Compazine, Benadryl and Valium to facilitate CT scans.  She was also prescribed acetazolamide 500 mg twice daily by her PCP which she will start taking today.  I discussed the importance of taking the acetazolamide as well as close follow-up with neurology and ophthalmology for her further workup outpatient including LP and formal eye exam which she is in agreement with.  I discussed strict return precautions and reasons to return and she is understanding of plan and safe for discharge at this time.   Amount and/or Complexity of Data Reviewed External Data Reviewed: radiology.    Details: MRI 04/11/22 IMPRESSION: 1. 5.5 mm T2 hyperintense lesion of the left cerebellum with a peripheral hemosiderin ring. 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 2. Empty expanded sella, prominence of CSF ventral to the pons and prominent CSF along the optic nerves all raises concern for idiopathic  intracranial hypertension. 3. No acute intracranial abnormality.   Radiology: ordered.  Risk Prescription drug management.    Final Clinical Impression(s) / ED Diagnoses Final diagnoses:  Bad headache  Hypokalemia  IIH (idiopathic intracranial hypertension)    Rx / DC Orders ED Discharge Orders          Ordered    Ambulatory referral to Ophthalmology       Comments: IIH   04/12/22 2309    Ambulatory referral to Neurology       Comments: An appointment is requested in approximately: 1 week   04/12/22 2309    butalbital-acetaminophen-caffeine (FIORICET) 50-325-40 MG tablet  Every 6 hours PRN        04/12/22 2335              Elgie Congo, MD 04/12/22 2344

## 2022-04-13 LAB — TSH: TSH: 0.928 u[IU]/mL (ref 0.350–4.500)

## 2022-04-13 LAB — SEDIMENTATION RATE: Sed Rate: 37 mm/hr — ABNORMAL HIGH (ref 0–22)

## 2022-04-13 LAB — VITAMIN B12: Vitamin B-12: 834 pg/mL (ref 180–914)

## 2022-04-13 LAB — FOLATE: Folate: 24.4 ng/mL (ref >5.9)

## 2022-04-14 LAB — ANTINUCLEAR ANTIBODIES, IFA: ANA Ab, IFA: NEGATIVE

## 2022-04-14 LAB — ANGIOTENSIN CONVERTING ENZYME: Angiotensin-Converting Enzyme: 22 U/L (ref 14–82)

## 2022-04-15 LAB — ANCA TITERS
Atypical P-ANCA titer: <1:20 {titer}
C-ANCA: <1:20 {titer}
P-ANCA: <1:20 {titer}

## 2022-04-19 ENCOUNTER — Encounter: Payer: Self-pay | Admitting: Neurology

## 2022-04-19 ENCOUNTER — Ambulatory Visit: Payer: Self-pay | Admitting: Neurology

## 2022-04-19 ENCOUNTER — Telehealth: Payer: Self-pay | Admitting: Neurology

## 2022-04-19 VITALS — BP 127/91 | HR 72 | Ht 71.0 in | Wt 225.8 lb

## 2022-04-19 DIAGNOSIS — G932 Benign intracranial hypertension: Secondary | ICD-10-CM | POA: Insufficient documentation

## 2022-04-19 DIAGNOSIS — G08 Intracranial and intraspinal phlebitis and thrombophlebitis: Secondary | ICD-10-CM

## 2022-04-19 NOTE — Telephone Encounter (Signed)
Self-pay sent to [facility] they will call her to schedule and I sent a message to Cristopher Peru at [facility] to schedule the lumbar puncture. [phone]

## 2022-04-19 NOTE — Progress Notes (Addendum)
[facility]    Provider:  Dr Jaynee Eagles Requesting Provider: Elgie Congo, MD Primary Care Provider:  Caren Macadam, MD  CC:  IDIOPATHIC INTRACRANIAL HYPERTENSION   HPI:  Teresa Kent is a 56 y.o. female here as requested by Elgie Congo, MD for IDIOPATHIC INTRACRANIAL HYPERTENSION.  Started 2 months agian, strange pressure in her neck, waxes and wanes, has headaches left side of the head, in the ear, full ear like an ear plug, blurry vision , pressure in the head, no inciding events, no numbness, ni weakness, headaches are tolerable 2-3/10, she has an upcoming vision exam who is an ophthalmologist, no vision loss episodic blurry vision, better if she leans to one side positional, more pressure. No other focal neurologic deficits, associated symptoms, inciting events or modifiable factors.the diamox has helped. Also going to rheumatology for her elevated ESR/CRP and is on steroids. .No other focal neurologic deficits, associated symptoms, inciting events or modifiable factors.  Reviewed notes, labs and imaging from outside physicians, which showed:   MRI brain: IMPRESSION: 1. 5.5 mm T2 hyperintense lesion of the left cerebellum with a peripheral hemosiderin ring. 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 2. Empty expanded sella, prominence of CSF ventral to the pons and prominent CSF along the optic nerves all raises concern for idiopathic intracranial hypertension. 3. No acute intracranial abnormality.  CTV of the head: 1. Evaluation is somewhat limited by bolus timing. Within this limitation, no intracranial large vessel occlusion, significant stenosis, or aneurysm. 2. Possible filling defect versus arachnoid granulation in the proximal left sigmoid sinus, with the left sigmoid sinus otherwise opacified. If there is persistent clinical concern for venous sinus thrombosis, consider MRV with contrast. 3. Narrowing of the bilateral sigmoid  sinuses at the transverse sigmoid junction, partial empty sella with sellar expansion, and optic nerve tortuosity, which together concerning for idiopathic intracranial hypertension. Correlate with symptoms and consider lumbar puncture with opening pressure.  Recent Results (from the past 2160 hour(s))  Basic Metabolic Panel (BMET)     Status: None   Collection Time: 04/07/22  8:49 AM  Result Value Ref Range   Glucose 81 70 - 99 mg/dL   BUN 17 6 - 24 mg/dL   Creatinine, Ser 0.84 0.57 - 1.00 mg/dL   eGFR 83 >59 mL/min/1.73   BUN/Creatinine Ratio 20 9 - 23   Sodium 140 134 - 144 mmol/L   Potassium 3.7 3.5 - 5.2 mmol/L   Chloride 99 96 - 106 mmol/L   CO2 27 20 - 29 mmol/L   Calcium 9.7 8.7 - 10.2 mg/dL  CBC     Status: None   Collection Time: 04/12/22  4:39 PM  Result Value Ref Range   WBC 7.5 4.0 - 10.5 K/uL   RBC 4.94 3.87 - 5.11 MIL/uL   Hemoglobin 14.2 12.0 - 15.0 g/dL   HCT 44.9 36.0 - 46.0 %   MCV 90.9 80.0 - 100.0 fL   MCH 28.7 26.0 - 34.0 pg   MCHC 31.6 30.0 - 36.0 g/dL   RDW 12.9 11.5 - 15.5 %   Platelets 369 150 - 400 K/uL   nRBC 0.0 0.0 - 0.2 %    Comment: Performed at [facility], [place] [address] [place], [place] [address]  Basic metabolic panel     Status: Abnormal   Collection Time: 04/12/22  4:39 PM  Result Value Ref Range   Sodium 142 135 - 145 mmol/L   Potassium 3.1 (L) 3.5 - 5.1 mmol/L  Chloride 101 98 - 111 mmol/L   CO2 31 22 - 32 mmol/L   Glucose, Bld 109 (H) 70 - 99 mg/dL    Comment: Glucose reference range applies only to samples taken after fasting for at least 8 hours.   BUN 10 6 - 20 mg/dL   Creatinine, Ser 1.04 (H) 0.44 - 1.00 mg/dL   Calcium 9.1 8.9 - 10.3 mg/dL   GFR, Estimated >60 >60 mL/min    Comment: (NOTE) Calculated using the CKD-EPI Creatinine Equation (2021)    Anion gap 10 5 - 15    Comment: Performed at [facility] [address] [place], [place] [address]  POC CBG, ED     Status: None   Collection Time: 04/12/22   4:41 PM  Result Value Ref Range   Glucose-Capillary 96 70 - 99 mg/dL    Comment: Glucose reference range applies only to samples taken after fasting for at least 8 hours.  Sedimentation rate     Status: Abnormal   Collection Time: 04/12/22  9:05 PM  Result Value Ref Range   Sed Rate 37 (H) 0 - 22 mm/hr    Comment: Performed at [facility] [address], [place], [place] [address]  C-reactive protein     Status: Abnormal   Collection Time: 04/12/22  9:05 PM  Result Value Ref Range   CRP 2.2 (H) <1.0 mg/dL    Comment: Performed at [facility] [address], [place], [place] [address]  Angiotensin converting enzyme     Status: None   Collection Time: 04/12/22  9:05 PM  Result Value Ref Range   Angiotensin-Converting Enzyme 22 14 - 82 U/L    Comment: (NOTE) Performed At: [facility] [place], [place] [id number] Rush Farmer MD [id number]   ANCA Titers     Status: None   Collection Time: 04/12/22  9:05 PM  Result Value Ref Range   C-ANCA <1:20 Neg:<1:20 titer   P-ANCA <1:20 Neg:<1:20 titer    Comment: (NOTE) The presence of positive fluorescence exhibiting P-ANCA or C-ANCA patterns alone is not specific for the diagnosis of Wegener's Granulomatosis (WG) or microscopic polyangiitis. Decisions about treatment should not be based solely on ANCA IFA results.  The International ANCA Group Consensus recommends follow up testing of positive sera with both PR-3 and MPO-ANCA enzyme immunoassays. As many as 5% serum samples are positive only by EIA. Ref. AM J Clin Pathol 1999;111:507-513.    Atypical P-ANCA titer <1:20 Neg:<1:20 titer    Comment: (NOTE) The atypical pANCA pattern has been observed in a significant percentage of patients with ulcerative colitis, primary sclerosing cholangitis and autoimmune hepatitis. Performed At: [facility] [address] [place], [place] [id number] Rush Farmer MD [id number]   ANA, IFA  (with reflex)     Status: None   Collection Time: 04/12/22  9:05 PM  Result Value Ref Range   ANA Ab, IFA Negative     Comment: (NOTE)                                     Negative   <1:80                                     Borderline  1:80  Positive   >1:80 ICAP nomenclature: AC-0 For more information about Hep-2 cell patterns use ANApatterns.org, the official website for the International Consensus on Antinuclear Antibody (ANA) Patterns (ICAP). Performed At: [facility] [place], [place] [id number] Rush Farmer MD [id number]   Vitamin B12     Status: None   Collection Time: 04/12/22 10:02 PM  Result Value Ref Range   Vitamin B-12 834 180 - 914 pg/mL    Comment: HEMOLYSIS AT THIS LEVEL MAY AFFECT RESULT (NOTE) This assay is not validated for testing neonatal or myeloproliferative syndrome specimens for Vitamin B12 levels. Performed at [facility], [place] [address] [place], [place] [address]   Folate     Status: None   Collection Time: 04/12/22 10:02 PM  Result Value Ref Range   Folate 24.4 >5.9 ng/mL    Comment: RESULTS CONFIRMED BY MANUAL DILUTION Performed at [facility], [place] [address] [place], [place] [address]   TSH     Status: None   Collection Time: 04/12/22 10:02 PM  Result Value Ref Range   TSH 0.928 0.350 - 4.500 uIU/mL    Comment: Performed by a 3rd Generation assay with a functional sensitivity of <=0.01 uIU/mL. Performed at [facility], [place] [address] [place], [place] [address]      Review of Systems: Patient complains of symptoms per HPI as well as the following symptoms neck pain. Pertinent negatives and positives per HPI. All others negative.   Social History   Socioeconomic History   Marital status: Single    Spouse name: Not on file   Number of children: Not on file   Years of education: Not on file   Highest education level: Not on file  Occupational History    Not on file  Tobacco Use   Smoking status: Never   Smokeless tobacco: Not on file  Substance and Sexual Activity   Alcohol use: No   Drug use: No   Sexual activity: Never    Birth control/protection: Surgical  Other Topics Concern   Not on file  Social History Narrative   Right Handed   No Caffeine use    Social Determinants of Health   Financial Resource Strain: Not on file  Food Insecurity: Not on file  Transportation Needs: Not on file  Physical Activity: Not on file  Stress: Not on file  Social Connections: Not on file  Intimate Partner Violence: Not on file    Family History  Problem Relation Age of Onset   Hypotension Mother    Breast cancer Maternal Grandmother    Diabetes Maternal Grandmother     Past Medical History:  Diagnosis Date   Abnormal Pap smear 02/2003   Anemia    heavy menses   Endometriosis 02/2004   Fibroids, submucosal 02/2004   H/O: menorrhagia 02/2004   Hypertension    Hypothyroidism    Increased BMI 10/11/10   Ovarian cyst, right 03/19/2004   Simple endometrial hyperplasia 02/2004    Patient Active Problem List   Diagnosis Date Noted   IIH (idiopathic intracranial hypertension) 04/19/2022   Hidradenitis suppurativa 12/19/2011    Past Surgical History:  Procedure Laterality Date   DILATION AND CURETTAGE OF UTERUS  03/19/2004   DILITATION & CURRETTAGE/HYSTROSCOPY WITH NOVASURE ABLATION N/A 12/20/2012   Procedure: DILATATION & CURETTAGE/HYSTEROSCOPY WITH NOVASURE ABLATION;  Surgeon: Delice Lesch, MD;  Location: [facility];  Service: Gynecology;  Laterality: N/A;   OVARY SURGERY  resection of fibroid  03/19/2004   THYROID SURGERY     TUBAL LIGATION      Current Outpatient Medications  Medication Sig Dispense Refill   levothyroxine (SYNTHROID, LEVOTHROID) 200 MCG tablet Take 150 mcg by mouth daily.     Multiple Vitamin (MULTIVITAMIN) tablet Take 1 tablet by mouth 2 (two) times daily.       predniSONE (STERAPRED UNI-PAK 21  TAB) 10 MG (21) TBPK tablet Take by mouth as directed.     triamterene-hydrochlorothiazide (MAXZIDE) 75-50 MG per tablet Take 0.5 tablets by mouth daily.       metoprolol tartrate (LOPRESSOR) 25 MG tablet Take 1 tablet (25 mg total) by mouth once for 1 dose. 1 tablet 0   No current facility-administered medications for this visit.    Allergies as of 04/19/2022 - Review Complete 04/19/2022  Allergen Reaction Noted   Prilosec [omeprazole]  04/12/2022   Simvastatin  04/12/2022    Vitals: BP (!) 127/91 (BP Location: Right Arm, Patient Position: Sitting, Cuff Size: Normal)   Pulse 72   Ht _0  (1.803 m)   Wt 225 lb 12.8 oz (102.4 kg)   BMI 31.49 kg/m  Last Weight:  Wt Readings from Last 1 Encounters:  04/19/22 225 lb 12.8 oz (102.4 kg)   Last Height:   Ht Readings from Last 1 Encounters:  04/19/22 _1  (1.803 m)     Physical exam: Exam: Gen: NAD, conversant, well nourised, obese, well groomed                     CV: RRR, no MRG. No Carotid Bruits. No peripheral edema, warm, nontender Eyes: Conjunctivae clear without exudates or hemorrhage  Neuro: Detailed Neurologic Exam  Speech:    Speech is normal; fluent and spontaneous with normal comprehension.  Cognition:    The patient is oriented to person, place, and time;     recent and remote memory intact;     language fluent;     normal attention, concentration,     fund of knowledge Cranial Nerves:    The pupils are equal, round, and reactive to light. The fundi are normal and spontaneous venous pulsations are present. Visual fields are full to finger confrontation. Extraocular movements are intact. Trigeminal sensation is intact and the muscles of mastication are normal. The face is symmetric. The palate elevates in the midline. Hearing intact. Voice is normal. Shoulder shrug is normal. The tongue has normal motion without fasciculations.   Coordination:    Normal finger to nose and heel to shin. Normal rapid  alternating movements.   Gait:    Heel-toe and tandem gait are normal.   Motor Observation:    No asymmetry, no atrophy, and no involuntary movements noted. Tone:    Normal muscle tone.    Posture:    Posture is normal. normal erect    Strength:    Strength is V/V in the upper and lower limbs.      Sensation: intact to LT     Reflex Exam:  DTR's:    Deep tendon reflexes in the upper and lower extremities are normal bilaterally.   Toes:    The toes are downgoing bilaterally.   Clonus:    Clonus is absent.    Assessment/Plan:  Patient with headache and MRi showing Empty expanded sella, prominence of CSF ventral to the pons and prominent CSF along the optic nerves all raises concern for idiopathic intracranial hypertension.  ADDENDUM 05/09/2022: Opening pressure was  ONLY 10 however she was already on diamox when she got to me, unclear. CTV will be repeated for stenosis seen prior/MRV with Bilateral transverse sinus stenosis on MRV likely due to [place] but given opening pressure 10 now will repeat CTV and send to [facility] per patient's request  CT showed Possible filling defect versus arachnoid granulation in the proximal left sigmoid sinus, with the left sigmoid sinus otherwise opacified.  Lumbar puncture for opening pressure and labs. MRV recommended.    Weight loss is critical, discussed weight loss and risk of permanent vision loss   Continue Diamox  Has ophthalmology appointment already  LP for opening pressure  Orders Placed This Encounter  Procedures   DG FLUORO GUIDED LOC OF NEEDLE/CATH TIP FOR SPINAL INJECT LT   MR MRV HEAD WO CM     For any acute change especially worsening headache or vision loss call 911 and proceed to ED  To prevent or relieve headaches, try the following: Cool Compress. Lie down and place a cool compress on your head.  Avoid headache triggers. If certain foods or odors seem to have triggered your  migraines in the past, avoid them. A headache diary might help you identify triggers.  Include physical activity in your daily routine. Try a daily walk or other moderate aerobic exercise.  Manage stress. Find healthy ways to cope with the stressors, such as delegating tasks on your to-do list.  Practice relaxation techniques. Try deep breathing, yoga, massage and visualization.  Eat regularly. Eating regularly scheduled meals and maintaining a healthy diet might help prevent headaches. Also, drink plenty of fluids.  Follow a regular sleep schedule. Sleep deprivation might contribute to headaches Consider biofeedback. With this mind-body technique, you learn to control certain bodily functions -- such as muscle tension, heart rate and blood pressure -- to prevent headaches or reduce headache pain.    Proceed to emergency room if you experience new or worsening symptoms or symptoms do not resolve, if you have new neurologic symptoms or if headache is severe, or for any concerning symptom.   Provided education and documentation from American headache Society toolbox including articles on: pseudotumoer cerebri(IIH), chronic migraine medication overuse headache, chronic migraines, prevention of migraines and other headaches, behavioral and other nonpharmacologic treatments for headache.   Orders Placed This Encounter  Procedures   DG FLUORO GUIDED LOC OF NEEDLE/CATH TIP FOR SPINAL INJECT LT   MR MRV HEAD WO CM   No orders of the defined types were placed in this encounter.   Cc: Elgie Congo, MD,  Caren Macadam, MD  Sarina Ill, MD  [facility] [address] [place], [place] [address]  Phone [phone] Fax [phone]

## 2022-04-19 NOTE — Progress Notes (Signed)
[facility] Note  04/20/2022     CHIEF COMPLAINT Patient presents for Retina Evaluation   HISTORY OF PRESENT ILLNESS: Teresa Kent is a 56 y.o. female who presents to the clinic today for:   HPI     Retina Evaluation   In both eyes.  I, the attending physician,  performed the HPI with the patient and updated documentation appropriately.        Comments   Patient is here based on a referral from the ED for IIH. She was seen in ED on 11/21. She is complaining of blurry vision. She is having a headache daily. She is not having buzzing or ringing in her ears. She will have to schedule a spinal tap.      Last edited by Bernarda Caffey, MD on 04/21/2022  2:32 PM.    Pt is here on ED f/u for headache on 11.21.22, pt states on 11.18.23 pt had a MRI bc she felt pressure in her head and could hear a "wooshing" sound in her head, pt saw a neurologist yesterday and had a MRV and a lumbar puncture ordered, pt is on diamox '500mg'$  BID per her PCP, pt states her vision is blurry even wearing her CL, she wears reading glasses over her CL and states she can read better without her CL, pt goes to the [facility] on [address] for regular eye exams.  Currently on po prednisone. Denies transient visual obscurations  Referring physician: Melvenia Beam, MD [place],   [id number]  HISTORICAL INFORMATION:   Selected notes from the [facility] ED f/u for IIH LEE:  Ocular Hx- PMH-    CURRENT MEDICATIONS: No current outpatient medications on file. (Ophthalmic Drugs)   No current facility-administered medications for this visit. (Ophthalmic Drugs)   Current Outpatient Medications (Other)  Medication Sig   levothyroxine (SYNTHROID, LEVOTHROID) 200 MCG tablet Take 150 mcg by mouth daily.   metoprolol tartrate (LOPRESSOR) 25 MG tablet Take 1 tablet (25 mg total) by mouth once for 1 dose.   Multiple Vitamin (MULTIVITAMIN) tablet Take 1  tablet by mouth 2 (two) times daily.     predniSONE (STERAPRED UNI-PAK 21 TAB) 10 MG (21) TBPK tablet Take by mouth as directed.   triamterene-hydrochlorothiazide (MAXZIDE) 75-50 MG per tablet Take 0.5 tablets by mouth daily.     No current facility-administered medications for this visit. (Other)   REVIEW OF SYSTEMS: ROS   Positive for: Neurological, Eyes Last edited by Annie Paras, COT on 04/20/2022  7:53 AM.     ALLERGIES Allergies  Allergen Reactions   Prilosec [Omeprazole]    Simvastatin    PAST MEDICAL HISTORY Past Medical History:  Diagnosis Date   Abnormal Pap smear 02/2003   Anemia    heavy menses   Endometriosis 02/2004   Fibroids, submucosal 02/2004   H/O: menorrhagia 02/2004   Hypertension    Hypothyroidism    Increased BMI 10/11/10   Ovarian cyst, right 03/19/2004   Simple endometrial hyperplasia 02/2004   Past Surgical History:  Procedure Laterality Date   DILATION AND CURETTAGE OF UTERUS  03/19/2004   DILITATION & CURRETTAGE/HYSTROSCOPY WITH NOVASURE ABLATION N/A 12/20/2012   Procedure: DILATATION & CURETTAGE/HYSTEROSCOPY WITH NOVASURE ABLATION;  Surgeon: Delice Lesch, MD;  Location: [facility];  Service: Gynecology;  Laterality: N/A;   OVARY SURGERY     resection of fibroid  03/19/2004   THYROID SURGERY     TUBAL  LIGATION     FAMILY HISTORY Family History  Problem Relation Age of Onset   Hypotension Mother    Breast cancer Maternal Grandmother    Diabetes Maternal Grandmother    SOCIAL HISTORY Social History   Tobacco Use   Smoking status: Never  Substance Use Topics   Alcohol use: No   Drug use: No       OPHTHALMIC EXAM:  Base Eye Exam     Visual Acuity (Snellen - Linear)       Right Left   Dist cc 20/25 +2 20/20    Correction: Contacts         Tonometry (Tonopen, 7:57 AM)       Right Left   Pressure 16 18         Pupils       Dark Light Shape React APD   Right 4 3 Round Brisk None   Left 4 3 Round Brisk  None         Visual Fields       Left Right    Full Full         Extraocular Movement       Right Left    Full, Ortho Full, Ortho         Neuro/Psych     Oriented x3: Yes         Dilation     Both eyes: 1.0% Mydriacyl, 2.5% Phenylephrine @ 7:54 AM           Slit Lamp and Fundus Exam     Slit Lamp Exam       Right Left   Lids/Lashes Dermatochalasis - upper lid Dermatochalasis - upper lid   Conjunctiva/Sclera mild melanosis mild melanosis   Cornea arcus, trace PEE arcus, trace PEE   Anterior Chamber deep and clear deep and clear   Iris Round and dilated Round and dilated   Lens 2+ Nuclear sclerosis, 2+ Cortical cataract 2+ Nuclear sclerosis, 2+ Cortical cataract   Anterior Vitreous Vitreous syneresis Vitreous syneresis         Fundus Exam       Right Left   Disc Pink and Sharp, no edema Pink and Sharp, no edema   C/D Ratio 0.3 0.3   Macula Flat, Good foveal reflex, RPE mottling, No heme or edema Flat, Good foveal reflex, RPE mottling, No heme or edema   Vessels mild copper wiring mild copper wiring   Periphery Attached, No heme Attached, No heme           IMAGING AND PROCEDURES  Imaging and Procedures for 04/20/2022  OCT, Retina - OU - Both Eyes       Right Eye Quality was good. Central Foveal Thickness: 259. Progression has no prior data. Findings include normal foveal contour, no IRF, no SRF, vitreomacular adhesion (No disc edema).   Left Eye Quality was good. Central Foveal Thickness: 252. Progression has no prior data. Findings include normal foveal contour, no IRF, no SRF (No disc edema).   Notes *Images captured and stored on drive  Diagnosis / Impression:  NFP; no IRF/SRF OU No disc edema OU  Clinical management:  See below  Abbreviations: NFP - Normal foveal profile. CME - cystoid macular edema. PED - pigment epithelial detachment. IRF - intraretinal fluid. SRF - subretinal fluid. EZ - ellipsoid zone. ERM - epiretinal  membrane. ORA - outer retinal atrophy. ORT - outer retinal tubulation. SRHM - subretinal hyper-reflective material. IRHM - intraretinal hyper-reflective material  ASSESSMENT/PLAN:    ICD-10-CM   1. IIH (idiopathic intracranial hypertension)  G93.2     2. Combined forms of age-related cataract of both eyes  H25.813     3. Essential hypertension  I10     4. Hypertensive retinopathy of both eyes  H35.033     5. Retinal edema  H35.81 OCT, Retina - OU - Both Eyes     Idiopathic Intracranial hypertension   - pt with 1+ mo history of headaches, blurred vision  - had MRI (ordered by PCP) on 11.18.23 -- shows empty sella and prominence of CSF concerning for IIH  - presented to ED on 11.21.23 for severe headache, where CTA and CT venogram showed concern for venous sinus thrombosis and other findings concerning for IIH again  - pt was then referred to Neurology and Ophthalmology for outpt f/u  - evaluated by Neurology on 11.28.23 -- LP and MRV ordered  - pt was prescribed diamox by PCP, but did not start until after her ED visit on 11.21.23  - on exam here, pt reports positional headaches; +pulsatile tinnitus; no transient visual obscurations / vision loss; - dilated exam here shows good vision (20/25 OD, 20/20 OS) and no optic disc edema OU - discussed IIH and various etiologies: Obesity, significant weight gain, and pregnancy are often associated with the idiopathic form. Possible causative factors include various medications such as oral contraceptives, tetracyclines (including semisynthetic derivatives, e.g., doxycycline), cyclosporine, vitamin A (>100,000 U/day), amiodarone, sulfa antibiotics, lithium, and historically nalidixic acid (now rarely used). Systemic steroid intake and withdrawal may also be causative. - pt is currently on po prednisone - agree with Neurology management with further imaging and LP - no retinal or ophthalmic interventions indicated or recommended as  vision and eye exam essentially normal - advised weight loss and continuation of acetazolamide - pt can f/u here prn  2. Mixed Cataract OU - The symptoms of cataract, surgical options, and treatments and risks were discussed with patient. - discussed diagnosis and progression - monitor  3,4. Hypertensive retinopathy OU - discussed importance of tight BP control - monitor   Ophthalmic Meds Ordered this visit:  No orders of the defined types were placed in this encounter.    Return if symptoms worsen or fail to improve.  There are no Patient Instructions on file for this visit.   Explained the diagnoses, plan, and follow up with the patient and they expressed understanding.  Patient expressed understanding of the importance of proper follow up care.   This document serves as a record of services personally performed by Gardiner Sleeper, MD, PhD. It was created on their behalf by San Jetty. Owens Shark, OA an ophthalmic technician. The creation of this record is the provider's dictation and/or activities during the visit.    Electronically signed by: San Jetty. [place], [place] 11.29.2023 2:49 PM .  Gardiner Sleeper, M.D., Ph.D. Diseases & Surgery of the Retina and Vitreous [facility]  I have reviewed the above documentation for accuracy and completeness, and I agree with the above. Gardiner Sleeper, M.D., Ph.D. 04/21/22 2:49 PM  Abbreviations: M myopia (nearsighted); A astigmatism; H hyperopia (farsighted); P presbyopia; Mrx spectacle prescription;  CTL contact lenses; OD right eye; OS left eye; OU both eyes  XT exotropia; ET esotropia; PEK punctate epithelial keratitis; PEE punctate epithelial erosions; DES dry eye syndrome; MGD meibomian gland dysfunction; ATs artificial tears; PFAT's preservative free artificial tears; [place] nuclear sclerotic cataract; PSC posterior subcapsular cataract;  ERM epi-retinal membrane; PVD posterior vitreous detachment; RD retinal detachment; DM  diabetes mellitus; DR diabetic retinopathy; NPDR non-proliferative diabetic retinopathy; PDR proliferative diabetic retinopathy; CSME clinically significant macular edema; DME diabetic macular edema; dbh dot blot hemorrhages; CWS cotton wool spot; POAG primary open angle glaucoma; C/D cup-to-disc ratio; HVF humphrey visual field; GVF goldmann visual field; OCT optical coherence tomography; IOP intraocular pressure; BRVO Branch retinal vein occlusion; CRVO central retinal vein occlusion; CRAO central retinal artery occlusion; BRAO branch retinal artery occlusion; RT retinal tear; SB scleral buckle; PPV pars plana vitrectomy; VH Vitreous hemorrhage; PRP panretinal laser photocoagulation; IVK intravitreal kenalog; VMT vitreomacular traction; MH Macular hole;  NVD neovascularization of the disc; NVE neovascularization elsewhere; AREDS age related eye disease study; ARMD age related macular degeneration; POAG primary open angle glaucoma; EBMD epithelial/anterior basement membrane dystrophy; ACIOL anterior chamber intraocular lens; IOL intraocular lens; PCIOL posterior chamber intraocular lens; Phaco/IOL phacoemulsification with intraocular lens placement; [place] photorefractive keratectomy; LASIK laser assisted in situ keratomileusis; HTN hypertension; DM diabetes mellitus; COPD chronic obstructive pulmonary disease

## 2022-04-19 NOTE — Patient Instructions (Signed)
Spinal tap MR of the veins of your head  Idiopathic Intracranial Hypertension  Idiopathic intracranial hypertension (IIH) is a condition that increases pressure around the brain. The fluid that surrounds the brain and spinal cord (cerebrospinal fluid, or CSF) increases and causes the pressure. Idiopathic means that the cause of this condition is not known. IIH affects the brain and spinal cord. If this condition is not treated, it can cause vision loss or blindness. What are the causes? The cause of this condition is not known. What increases the risk? The following factors may make you more likely to develop this condition: Being obese. Being a person who is female, between the ages of 68 and 30 years old, and who has not gone through menopause. Taking certain medicines, such as birth control, acne medicines, or steroids. What are the signs or symptoms? Symptoms of this condition include: Headaches. This is the most common symptom. Brief periods of total blindness. Double vision, blurred vision, or poor side (peripheral) vision. Pain in the shoulders or neck. Nausea and vomiting. A sound like rushing water or a pulsing sound within the ears (pulsatile tinnitus), or ringing in the ears. How is this diagnosed? This condition may be diagnosed based on: Your symptoms and medical history. Imaging tests of the brain, such as: CT scan. MRI. Magnetic resonance venogram (MRV) to check the veins. Diagnostic lumbar puncture. This is a procedure to remove and examine a sample of CSF. This procedure can determine whether your fluid pressure is too high. An eye exam to check for swelling or nerve damage in the eyes. How is this treated? Treatment for this condition depends on the symptoms. The goal of treatment is to decrease the pressure around your brain. Common treatments include: Weight loss through healthy eating, salt restriction, and exercise, if you are overweight. Medicines to decrease  the production of CSF and lower the pressure within your skull. Medicines to prevent or treat headaches. Other treatments may include: Surgery to place drains (shunts) in your brain to remove extra fluid. Lumbar puncture to remove extra CSF. Follow these instructions at home: If you are overweight or obese, work with your health care provider to lose weight. Take over-the-counter and prescription medicines only as told by your health care provider. Ask your health care provider if the medicine prescribed to you requires you to avoid driving or using machinery. Do not use any products that contain nicotine or tobacco. These products include cigarettes, chewing tobacco, and vaping devices, such as e-cigarettes. If you need help quitting, ask your health care provider. Keep all follow-up visits. Your health care provider will need to monitor you regularly. Contact a health care provider if: You have changes in your vision, such as: Double vision. Blurred vision. Poor peripheral vision. Get help right away if: You have any of the following symptoms and they get worse or do not get better: Headaches. Nausea. Vomiting. Sudden trouble seeing. This information is not intended to replace advice given to you by your health care provider. Make sure you discuss any questions you have with your health care provider. Document Revised: 10/05/2021 Document Reviewed: 09/14/2021 Elsevier Patient Education  [employer].

## 2022-04-20 ENCOUNTER — Ambulatory Visit (HOSPITAL_COMMUNITY): Payer: Self-pay

## 2022-04-20 ENCOUNTER — Ambulatory Visit (INDEPENDENT_AMBULATORY_CARE_PROVIDER_SITE_OTHER): Payer: Self-pay | Admitting: Ophthalmology

## 2022-04-20 ENCOUNTER — Encounter (INDEPENDENT_AMBULATORY_CARE_PROVIDER_SITE_OTHER): Payer: Self-pay | Admitting: Ophthalmology

## 2022-04-20 DIAGNOSIS — G932 Benign intracranial hypertension: Secondary | ICD-10-CM

## 2022-04-20 DIAGNOSIS — H25813 Combined forms of age-related cataract, bilateral: Secondary | ICD-10-CM

## 2022-04-20 DIAGNOSIS — I10 Essential (primary) hypertension: Secondary | ICD-10-CM

## 2022-04-20 DIAGNOSIS — H35033 Hypertensive retinopathy, bilateral: Secondary | ICD-10-CM

## 2022-04-20 DIAGNOSIS — H3581 Retinal edema: Secondary | ICD-10-CM

## 2022-04-21 ENCOUNTER — Encounter (INDEPENDENT_AMBULATORY_CARE_PROVIDER_SITE_OTHER): Payer: Self-pay | Admitting: Ophthalmology

## 2022-04-21 NOTE — Progress Notes (Signed)
[facility] Note  04/20/2022     CHIEF COMPLAINT Patient presents for Retina Evaluation   HISTORY OF PRESENT ILLNESS: Teresa Kent is a 56 y.o. female who presents to the clinic today for:   HPI     Retina Evaluation   In both eyes.  I, the attending physician,  performed the HPI with the patient and updated documentation appropriately.        Comments   Patient is here based on a referral from the ED for IIH. She was seen in ED on 11/21. She is complaining of blurry vision. She is having a headache daily. She is not having buzzing or ringing in her ears. She will have to schedule a spinal tap.      Last edited by Bernarda Caffey, MD on 04/21/2022  2:32 PM.    Pt is here on ED f/u for headache on 11.21.22, pt states on 11.18.23 pt had a MRI bc she felt pressure in her head and could hear a "wooshing" sound in her head, pt saw a neurologist yesterday and had a MRV and a lumbar puncture ordered, pt is on diamox '500mg'$  BID per her PCP, pt states her vision is blurry even wearing her CL, she wears reading glasses over her CL and states she can read better without her CL, pt goes to the [facility] on [address] for regular eye exams.  Currently on po prednisone. Denies transient visual obscurations  Referring physician: Melvenia Beam, MD [place],  [place] [address]  HISTORICAL INFORMATION:   Selected notes from the [facility] ED f/u for IIH LEE:  Ocular Hx- PMH-    CURRENT MEDICATIONS: No current outpatient medications on file. (Ophthalmic Drugs)   No current facility-administered medications for this visit. (Ophthalmic Drugs)   Current Outpatient Medications (Other)  Medication Sig   levothyroxine (SYNTHROID, LEVOTHROID) 200 MCG tablet Take 150 mcg by mouth daily.   metoprolol tartrate (LOPRESSOR) 25 MG tablet Take 1 tablet (25 mg total) by mouth once for 1 dose.   Multiple Vitamin (MULTIVITAMIN) tablet Take 1  tablet by mouth 2 (two) times daily.     predniSONE (STERAPRED UNI-PAK 21 TAB) 10 MG (21) TBPK tablet Take by mouth as directed.   triamterene-hydrochlorothiazide (MAXZIDE) 75-50 MG per tablet Take 0.5 tablets by mouth daily.     No current facility-administered medications for this visit. (Other)   REVIEW OF SYSTEMS: ROS   Positive for: Neurological, Eyes Last edited by Annie Paras, COT on 04/20/2022  7:53 AM.     ALLERGIES Allergies  Allergen Reactions   Prilosec [Omeprazole]    Simvastatin    PAST MEDICAL HISTORY Past Medical History:  Diagnosis Date   Abnormal Pap smear 02/2003   Anemia    heavy menses   Endometriosis 02/2004   Fibroids, submucosal 02/2004   H/O: menorrhagia 02/2004   Hypertension    Hypothyroidism    Increased BMI 10/11/10   Ovarian cyst, right 03/19/2004   Simple endometrial hyperplasia 02/2004   Past Surgical History:  Procedure Laterality Date   DILATION AND CURETTAGE OF UTERUS  03/19/2004   DILITATION & CURRETTAGE/HYSTROSCOPY WITH NOVASURE ABLATION N/A 12/20/2012   Procedure: DILATATION & CURETTAGE/HYSTEROSCOPY WITH NOVASURE ABLATION;  Surgeon: Delice Lesch, MD;  Location: [facility];  Service: Gynecology;  Laterality: N/A;   OVARY SURGERY     resection of fibroid  03/19/2004   THYROID SURGERY     TUBAL  LIGATION     FAMILY HISTORY Family History  Problem Relation Age of Onset   Hypotension Mother    Breast cancer Maternal Grandmother    Diabetes Maternal Grandmother    SOCIAL HISTORY Social History   Tobacco Use   Smoking status: Never  Substance Use Topics   Alcohol use: No   Drug use: No       OPHTHALMIC EXAM:  Base Eye Exam     Visual Acuity (Snellen - Linear)       Right Left   Dist cc 20/25 +2 20/20    Correction: Contacts         Tonometry (Tonopen, 7:57 AM)       Right Left   Pressure 16 18         Pupils       Dark Light Shape React APD   Right 4 3 Round Brisk None   Left 4 3 Round Brisk  None         Visual Fields       Left Right    Full Full         Extraocular Movement       Right Left    Full, Ortho Full, Ortho         Neuro/Psych     Oriented x3: Yes         Dilation     Both eyes: 1.0% Mydriacyl, 2.5% Phenylephrine @ 7:54 AM           Slit Lamp and Fundus Exam     Slit Lamp Exam       Right Left   Lids/Lashes Dermatochalasis - upper lid Dermatochalasis - upper lid   Conjunctiva/Sclera mild melanosis mild melanosis   Cornea arcus, trace PEE arcus, trace PEE   Anterior Chamber deep and clear deep and clear   Iris Round and dilated Round and dilated   Lens 2+ Nuclear sclerosis, 2+ Cortical cataract 2+ Nuclear sclerosis, 2+ Cortical cataract   Anterior Vitreous Vitreous syneresis Vitreous syneresis         Fundus Exam       Right Left   Disc Pink and Sharp, no edema Pink and Sharp, no edema   C/D Ratio 0.3 0.3   Macula Flat, Good foveal reflex, RPE mottling, No heme or edema Flat, Good foveal reflex, RPE mottling, No heme or edema   Vessels mild copper wiring mild copper wiring   Periphery Attached, No heme Attached, No heme           IMAGING AND PROCEDURES  Imaging and Procedures for 04/20/2022  OCT, Retina - OU - Both Eyes       Right Eye Quality was good. Central Foveal Thickness: 259. Progression has no prior data. Findings include normal foveal contour, no IRF, no SRF, vitreomacular adhesion (No disc edema).   Left Eye Quality was good. Central Foveal Thickness: 252. Progression has no prior data. Findings include normal foveal contour, no IRF, no SRF (No disc edema).   Notes *Images captured and stored on drive  Diagnosis / Impression:  NFP; no IRF/SRF OU No disc edema OU  Clinical management:  See below  Abbreviations: NFP - Normal foveal profile. CME - cystoid macular edema. PED - pigment epithelial detachment. IRF - intraretinal fluid. SRF - subretinal fluid. EZ - ellipsoid zone. ERM - epiretinal  membrane. ORA - outer retinal atrophy. ORT - outer retinal tubulation. SRHM - subretinal hyper-reflective material. IRHM - intraretinal hyper-reflective material  ASSESSMENT/PLAN:    ICD-10-CM   1. IIH (idiopathic intracranial hypertension)  G93.2     2. Combined forms of age-related cataract of both eyes  H25.813     3. Essential hypertension  I10     4. Hypertensive retinopathy of both eyes  H35.033     5. Retinal edema  H35.81 OCT, Retina - OU - Both Eyes     Idiopathic Intracranial hypertension   - pt with 1+ mo history of headaches, blurred vision  - had MRI (ordered by PCP) on 11.18.23 -- shows empty sella and prominence of CSF concerning for IIH  - presented to ED on 11.21.23 for severe headache, where CTA and CT venogram showed concern for venous sinus thrombosis and other findings concerning for IIH again  - pt was then referred to Neurology and Ophthalmology for outpt f/u  - evaluated by Neurology on 11.28.23 -- LP and MRV ordered  - pt was prescribed diamox by PCP, but did not start until after her ED visit on 11.21.23  - on exam here, pt reports positional headaches; +pulsatile tinnitus; no transient visual obscurations / vision loss; - dilated exam here shows good vision (20/25 OD, 20/20 OS) and no optic disc edema OU - discussed IIH and various etiologies: Obesity, significant weight gain, and pregnancy are often associated with the idiopathic form. Possible causative factors include various medications such as oral contraceptives, tetracyclines (including semisynthetic derivatives, e.g., doxycycline), cyclosporine, vitamin A (>100,000 U/day), amiodarone, sulfa antibiotics, lithium, and historically nalidixic acid (now rarely used). Systemic steroid intake and withdrawal may also be causative. - pt is currently on po prednisone - agree with Neurology management with further imaging and LP - no retinal or ophthalmic interventions indicated or recommended as  vision and eye exam essentially normal - advised weight loss and continuation of acetazolamide - pt can f/u here prn  2. Mixed Cataract OU - The symptoms of cataract, surgical options, and treatments and risks were discussed with patient. - discussed diagnosis and progression - monitor  3,4. Hypertensive retinopathy OU - discussed importance of tight BP control - monitor   Ophthalmic Meds Ordered this visit:  No orders of the defined types were placed in this encounter.    Return if symptoms worsen or fail to improve.  There are no Patient Instructions on file for this visit.   Explained the diagnoses, plan, and follow up with the patient and they expressed understanding.  Patient expressed understanding of the importance of proper follow up care.   This document serves as a record of services personally performed by Gardiner Sleeper, MD, PhD. It was created on their behalf by San Jetty. Owens Shark, OA an ophthalmic technician. The creation of this record is the provider's dictation and/or activities during the visit.    Electronically signed by: San Jetty. [place], [place] 11.29.2023 2:52 PM .  Gardiner Sleeper, M.D., Ph.D. Diseases & Surgery of the Retina and Vitreous [facility]  I have reviewed the above documentation for accuracy and completeness, and I agree with the above. Gardiner Sleeper, M.D., Ph.D. 04/21/22 2:52 PM  Abbreviations: M myopia (nearsighted); A astigmatism; H hyperopia (farsighted); P presbyopia; Mrx spectacle prescription;  CTL contact lenses; OD right eye; OS left eye; OU both eyes  XT exotropia; ET esotropia; PEK punctate epithelial keratitis; PEE punctate epithelial erosions; DES dry eye syndrome; MGD meibomian gland dysfunction; ATs artificial tears; PFAT's preservative free artificial tears; [place] nuclear sclerotic cataract; PSC posterior subcapsular cataract;  ERM epi-retinal membrane; PVD posterior vitreous detachment; RD retinal detachment; DM  diabetes mellitus; DR diabetic retinopathy; NPDR non-proliferative diabetic retinopathy; PDR proliferative diabetic retinopathy; CSME clinically significant macular edema; DME diabetic macular edema; dbh dot blot hemorrhages; CWS cotton wool spot; POAG primary open angle glaucoma; C/D cup-to-disc ratio; HVF humphrey visual field; GVF goldmann visual field; OCT optical coherence tomography; IOP intraocular pressure; BRVO Branch retinal vein occlusion; CRVO central retinal vein occlusion; CRAO central retinal artery occlusion; BRAO branch retinal artery occlusion; RT retinal tear; SB scleral buckle; PPV pars plana vitrectomy; VH Vitreous hemorrhage; PRP panretinal laser photocoagulation; IVK intravitreal kenalog; VMT vitreomacular traction; MH Macular hole;  NVD neovascularization of the disc; NVE neovascularization elsewhere; AREDS age related eye disease study; ARMD age related macular degeneration; POAG primary open angle glaucoma; EBMD epithelial/anterior basement membrane dystrophy; ACIOL anterior chamber intraocular lens; IOL intraocular lens; PCIOL posterior chamber intraocular lens; Phaco/IOL phacoemulsification with intraocular lens placement; [place] photorefractive keratectomy; LASIK laser assisted in situ keratomileusis; HTN hypertension; DM diabetes mellitus; COPD chronic obstructive pulmonary disease

## 2022-04-25 ENCOUNTER — Ambulatory Visit
Admission: RE | Admit: 2022-04-25 | Discharge: 2022-04-25 | Disposition: A | Discharge status: Home / Self Care | Payer: Self-pay | Source: Ambulatory Visit | Attending: Neurology | Admitting: Neurology

## 2022-04-25 ENCOUNTER — Other Ambulatory Visit: Payer: Self-pay | Admitting: Neurology

## 2022-04-25 ENCOUNTER — Ambulatory Visit: Payer: Self-pay | Admitting: Neurology

## 2022-04-25 ENCOUNTER — Telehealth: Payer: Self-pay | Admitting: Neurology

## 2022-04-25 DIAGNOSIS — G932 Benign intracranial hypertension: Secondary | ICD-10-CM

## 2022-04-25 LAB — CSF CELL COUNT WITH DIFFERENTIAL
RBC Count, CSF: 4 cells/uL — ABNORMAL HIGH
TOTAL NUCLEATED CELL: 1 cells/uL (ref 0–5)

## 2022-04-25 LAB — PROTEIN, CSF: Total Protein, CSF: 48 mg/dL — ABNORMAL HIGH (ref 15–45)

## 2022-04-25 LAB — GLUCOSE, CSF: Glucose, CSF: 68 mg/dL (ref 40–80)

## 2022-04-25 MED ORDER — ACETAZOLAMIDE 125 MG PO TABS: 125.0000 mg | ORAL_TABLET | Freq: Two times a day (BID) | ORAL | 4 refills | Status: DC

## 2022-04-25 NOTE — Discharge Instructions (Signed)

## 2022-04-25 NOTE — Telephone Encounter (Signed)
I think we can reduce the dose. I'll send in a smaller dose thanks

## 2022-04-25 NOTE — Telephone Encounter (Signed)
I called pt.  She has LP done this am.  She had normal pressure.  She says that the dimaox capsules '500mg'$  po bid started 04-13-2022 she says that for the last 3-4 days she has had nausea, dizziness, drowsiness (almost passed out one day). She states she starts to feel these sx 30 min after taking, and it does not dissapate over time.  No additional medications added.  She had CSF labs done this am.  Please advise.  She did not take this am.

## 2022-04-25 NOTE — Telephone Encounter (Signed)
Pt having reaction to diamox (azetazolamide)  nausea, dizziness and drowiness. Started a couple of days ago, but have been getting worse. Would like a call from the nurse.

## 2022-04-26 ENCOUNTER — Encounter: Payer: Self-pay | Admitting: Neurology

## 2022-04-26 NOTE — Telephone Encounter (Signed)
Note sent to pt about reduced dose. Pt had also messaged early this AM with additional question which I sent to Dr Jaynee Eagles.

## 2022-04-27 ENCOUNTER — Other Ambulatory Visit: Payer: No Typology Code available for payment source

## 2022-04-27 ENCOUNTER — Inpatient Hospital Stay: Admission: RE | Admit: 2022-04-27 | Payer: BC Managed Care – PPO | Source: Ambulatory Visit

## 2022-04-28 ENCOUNTER — Ambulatory Visit
Admission: RE | Admit: 2022-04-28 | Discharge: 2022-04-28 | Disposition: A | Discharge status: Home / Self Care | Payer: No Typology Code available for payment source | Source: Ambulatory Visit | Attending: Neurology | Admitting: Neurology

## 2022-04-28 DIAGNOSIS — G08 Intracranial and intraspinal phlebitis and thrombophlebitis: Secondary | ICD-10-CM

## 2022-04-28 DIAGNOSIS — G932 Benign intracranial hypertension: Secondary | ICD-10-CM

## 2022-04-29 ENCOUNTER — Encounter: Payer: Self-pay | Admitting: Neurology

## 2022-05-01 ENCOUNTER — Ambulatory Visit
Admission: RE | Admit: 2022-05-01 | Discharge: 2022-05-01 | Disposition: A | Discharge status: Home / Self Care | Payer: BC Managed Care – PPO | Source: Ambulatory Visit

## 2022-05-01 VITALS — BP 127/85 | HR 70 | Temp 97.7°F | Resp 20

## 2022-05-01 DIAGNOSIS — R110 Nausea: Secondary | ICD-10-CM

## 2022-05-01 DIAGNOSIS — Z8669 Personal history of other diseases of the nervous system and sense organs: Secondary | ICD-10-CM

## 2022-05-01 DIAGNOSIS — E876 Hypokalemia: Secondary | ICD-10-CM

## 2022-05-01 MED ORDER — ONDANSETRON 8 MG PO TBDP: 8.0000 mg | ORAL_TABLET | Freq: Three times a day (TID) | ORAL | 0 refills | Status: DC | PRN

## 2022-05-01 NOTE — ED Provider Notes (Signed)
[facility]    CSN: [id number] Arrival date & time: 05/01/22  1007      History   Chief Complaint Chief Complaint  Patient presents with   Medication Reaction    Entered by patient    HPI Teresa Kent is a 56 y.o. female.   Subjective:   Teresa Kent is a 56 y.o. female with a recent diagnosis of idiopathic intracranial hypertension who presents for evaluation of nausea, back pain, chills and a pulling sensation within the chest for the past day. Patient describes nausea as mild. No vomiting, inability to keep down fluids, close contact with similar illness, suspicious food/drink, alcohol overuse, fever, or possibility of pregnancy. Symptoms have been intermittent.  Patient thinks that her current symptoms are due to a medication reaction to her prescribed potassium chloride. She was diagnosed with hypokalemia and reports her last K was 2.8. Patient was recently started on Diamox for her intracranial hypertension.  Patient had some adverse reactions to that medication as well and required a reduction in dosage.  Patient reports that her symptoms regarding her intracranial hypertension has improved significantly.  She took 1 dose of the potassium and her current symptoms began the next day. She still continued to take the potassium for the next 2 scheduled doses but has not taken it today.  Patient called the after-hours numbers of her PCP and was told to go to the emergency room to have her potassium checked but she came here instead.  The following portions of the patient's history were reviewed and updated as appropriate: allergies, current medications, past family history, past medical history, past social history, past surgical history, and problem list.         Past Medical History:  Diagnosis Date   Abnormal Pap smear 02/2003   Anemia    heavy menses   Endometriosis 02/2004   Fibroids, submucosal 02/2004   H/O: menorrhagia 02/2004   Hypertension     Hypothyroidism    Increased BMI 10/11/10   Ovarian cyst, right 03/19/2004   Simple endometrial hyperplasia 02/2004    Patient Active Problem List   Diagnosis Date Noted   IIH (idiopathic intracranial hypertension) 04/19/2022   Hidradenitis suppurativa 12/19/2011    Past Surgical History:  Procedure Laterality Date   DILATION AND CURETTAGE OF UTERUS  03/19/2004   DILITATION & CURRETTAGE/HYSTROSCOPY WITH NOVASURE ABLATION N/A 12/20/2012   Procedure: DILATATION & CURETTAGE/HYSTEROSCOPY WITH NOVASURE ABLATION;  Surgeon: Delice Lesch, MD;  Location: [facility];  Service: Gynecology;  Laterality: N/A;   OVARY SURGERY     resection of fibroid  03/19/2004   THYROID SURGERY     TUBAL LIGATION      OB History     Gravida  1   Para  1   Term  1   Preterm      AB      Living  1      SAB      IAB      Ectopic      Multiple      Live Births  1            Home Medications    Prior to Admission medications   Medication Sig Start Date End Date Taking? Authorizing Provider  ondansetron (ZOFRAN-ODT) 8 MG disintegrating tablet Take 1 tablet (8 mg total) by mouth every 8 (eight) hours as needed for nausea or vomiting. 05/01/22  Yes Enrique Sack, FNP  potassium chloride (KLOR-CON) 10 MEQ  tablet Take 10 mEq by mouth 2 (two) times daily. 04/29/22  Yes [provider]  acetaZOLAMIDE (DIAMOX) 125 MG tablet Take 1 tablet (125 mg total) by mouth 2 (two) times daily. 04/25/22   Melvenia Beam, MD  levothyroxine (SYNTHROID, LEVOTHROID) 200 MCG tablet Take 150 mcg by mouth daily.    [provider]  Multiple Vitamin (MULTIVITAMIN) tablet Take 1 tablet by mouth 2 (two) times daily.      [provider]    Family History Family History  Problem Relation Age of Onset   Hypotension Mother    Breast cancer Maternal Grandmother    Diabetes Maternal Grandmother     Social History Social History   Tobacco Use   Smoking status: Never  Substance  Use Topics   Alcohol use: No   Drug use: No     Allergies   Prilosec [omeprazole] and Simvastatin   Review of Systems Review of Systems  Constitutional:  Positive for chills. Negative for fatigue and fever.  Respiratory:  Negative for cough and shortness of breath.   Cardiovascular:  Negative for chest pain, palpitations and leg swelling.  Gastrointestinal:  Positive for nausea. Negative for constipation, diarrhea and vomiting.  Musculoskeletal:  Negative for myalgias.  Neurological:  Positive for dizziness and headaches. Negative for numbness.  All other systems reviewed and are negative.    Physical Exam Triage Vital Signs ED Triage Vitals  Enc Vitals Group     BP 05/01/22 1013 127/85     Pulse Rate 05/01/22 1013 70     Resp 05/01/22 1013 20     Temp 05/01/22 1013 97.7 F (36.5 C)     Temp Source 05/01/22 1013 Oral     SpO2 05/01/22 1013 98 %     Weight --      Height --      Head Circumference --      Peak Flow --      Pain Score 05/01/22 1022 0     Pain Loc --      Pain Edu? --      Excl. in [place]? --    No data found.  Updated Vital Signs BP 127/85 (BP Location: Left Arm)   Pulse 70   Temp 97.7 F (36.5 C) (Oral)   Resp 20   LMP 10/31/2019   SpO2 98%   Visual Acuity Right Eye Distance:   Left Eye Distance:   Bilateral Distance:    Right Eye Near:   Left Eye Near:    Bilateral Near:     Physical Exam Vitals reviewed.  Constitutional:      General: She is not in acute distress.    Appearance: Normal appearance. She is not ill-appearing, toxic-appearing or diaphoretic.  HENT:     Head: Normocephalic.     Nose: Nose normal.  Eyes:     Conjunctiva/sclera: Conjunctivae normal.  Cardiovascular:     Rate and Rhythm: Normal rate and regular rhythm.     Pulses: Normal pulses.     Heart sounds: Normal heart sounds.  Pulmonary:     Effort: Pulmonary effort is normal.     Breath sounds: Normal breath sounds.  Chest:     Chest wall: No tenderness.   Abdominal:     Palpations: Abdomen is soft.  Musculoskeletal:        General: Normal range of motion.     Cervical back: Normal range of motion and neck supple. No rigidity or tenderness.  Lymphadenopathy:  Cervical: No cervical adenopathy.  Skin:    General: Skin is warm and dry.  Neurological:     General: No focal deficit present.     Mental Status: She is alert and oriented to person, place, and time.     Cranial Nerves: Cranial nerves 2-12 are intact.     Sensory: Sensation is intact.     Motor: Motor function is intact.     Coordination: Coordination is intact.     Gait: Gait is intact.  Psychiatric:        Mood and Affect: Mood normal.        Behavior: Behavior normal.      UC Treatments / Results  Labs (all labs ordered are listed, but only abnormal results are displayed) Labs Reviewed  BASIC METABOLIC PANEL    EKG   Radiology No results found.  Procedures Procedures (including critical care time)  Medications Ordered in UC Medications - No data to display  Initial Impression / Assessment and Plan / UC Course  I have reviewed the triage vital signs and the nursing notes.  Pertinent labs & imaging results that were available during my care of the patient were reviewed by me and considered in my medical decision making (see chart for details).    56 yo female with a recent diagnosis of idiopathic intracranial hypertension who presents for evaluation of nausea, back pain, chills and a pulling sensation within the chest for the past day after starting new prescription of potassium chloride for hypokalemia.  She denies any constipation, palpitations, fatigue, myalgias, chest pain, shortness of breath or paresthesias.  She is taken 3 of her scheduled potassium doses since symptom onset but has not taken it today.  Patient is afebrile.  Nontoxic.  Vital signs stable.  Physical exam as above.  Advised patient to stop potassium for now.  Continue all other  therapies.  Will check a BMP.  Zofran ordered as needed.  Discussed potassium rich foods.  Patient should monitor symptoms closely and call PCP in the morning to arrange follow-up.  Discussed indications for immediate ED evaluation.  Today's evaluation has revealed no signs of a dangerous process. Discussed diagnosis with patient and/or guardian. Patient and/or guardian aware of their diagnosis, possible red flag symptoms to watch out for and need for close follow up. Patient and/or guardian understands verbal and written discharge instructions. Patient and/or guardian comfortable with plan and disposition.  Patient and/or guardian has a clear mental status at this time, good insight into illness (after discussion and teaching) and has clear judgment to make decisions regarding their care  Documentation was completed with the aid of voice recognition software. Transcription may contain typographical errors. Final Clinical Impressions(s) / UC Diagnoses   Final diagnoses:  Hypokalemia  Nausea without vomiting  History of idiopathic intracranial hypertension     Discharge Instructions      Continue diamox.  Stop potassium for now.  Take medications as needed for nausea  Eat potassium rich foods (see attached)  Monitor your symptoms closely.  Call Dr. Roma Kayser office in the morning. Let them know that you stopped the potassium and that we have drawn blood here to check your potassium level.   Go to the ED immediately if:  You have chest pain. You have shortness of breath. You have vomiting or diarrhea that lasts for more than 2 days. You faint.     ED Prescriptions     Medication Sig Dispense Auth. Provider   ondansetron (ZOFRAN-ODT)  8 MG disintegrating tablet Take 1 tablet (8 mg total) by mouth every 8 (eight) hours as needed for nausea or vomiting. 20 tablet Enrique Sack, FNP      PDMP not reviewed this encounter.   Enrique Sack, [place] 05/01/22 1131

## 2022-05-01 NOTE — ED Triage Notes (Signed)
Pt reports a bad reaction to her potassium chloride that started Friday. Loss of appetite  and nauseous and a pulling feeling in her chest.

## 2022-05-01 NOTE — Discharge Instructions (Addendum)
Continue diamox.  Stop potassium for now.  Take medications as needed for nausea  Eat potassium rich foods (see attached)  Monitor your symptoms closely.  Call Dr. Roma Kayser office in the morning. Let them know that you stopped the potassium and that we have drawn blood here to check your potassium level.   Go to the ED immediately if:  You have chest pain. You have shortness of breath. You have vomiting or diarrhea that lasts for more than 2 days. You faint.

## 2022-05-02 ENCOUNTER — Encounter: Payer: Self-pay | Admitting: Neurology

## 2022-05-02 ENCOUNTER — Ambulatory Visit (HOSPITAL_COMMUNITY): Admission: RE | Admit: 2022-05-02 | Payer: BC Managed Care – PPO | Source: Ambulatory Visit

## 2022-05-03 ENCOUNTER — Other Ambulatory Visit: Payer: Self-pay | Admitting: Neurology

## 2022-05-03 DIAGNOSIS — G08 Intracranial and intraspinal phlebitis and thrombophlebitis: Secondary | ICD-10-CM

## 2022-05-03 DIAGNOSIS — G932 Benign intracranial hypertension: Secondary | ICD-10-CM

## 2022-05-03 DIAGNOSIS — Q248 Other specified congenital malformations of heart: Secondary | ICD-10-CM

## 2022-05-03 LAB — BASIC METABOLIC PANEL
BUN/Creatinine Ratio: 9 (ref 9–23)
BUN: 7 mg/dL (ref 6–24)
CO2: 22 mmol/L (ref 20–29)
Calcium: 9.9 mg/dL (ref 8.7–10.2)
Chloride: 93 mmol/L — ABNORMAL LOW (ref 96–106)
Creatinine, Ser: 0.77 mg/dL (ref 0.57–1.00)
Glucose: 99 mg/dL (ref 70–99)
Potassium: 3.1 mmol/L — ABNORMAL LOW (ref 3.5–5.2)
Sodium: 134 mmol/L (ref 134–144)
eGFR: 92 mL/min/{1.73_m2} (ref >59)

## 2022-05-03 MED ORDER — TOPIRAMATE 50 MG PO TABS: ORAL_TABLET | ORAL | 6 refills | Status: DC

## 2022-05-04 ENCOUNTER — Other Ambulatory Visit: Payer: BC Managed Care – PPO

## 2022-05-06 ENCOUNTER — Other Ambulatory Visit: Payer: BC Managed Care – PPO

## 2022-05-09 ENCOUNTER — Other Ambulatory Visit: Payer: Self-pay | Admitting: Neurology

## 2022-05-09 ENCOUNTER — Telehealth: Payer: Self-pay | Admitting: Neurology

## 2022-05-09 DIAGNOSIS — G932 Benign intracranial hypertension: Secondary | ICD-10-CM

## 2022-05-09 NOTE — Telephone Encounter (Signed)
Referral for Neurology fax to [facility]. Phone: [phone], Fax: [phone].

## 2022-05-10 ENCOUNTER — Encounter: Payer: Self-pay | Admitting: Family Medicine

## 2022-05-10 NOTE — Progress Notes (Signed)
Chief Complaint  Patient presents with   Follow-up    Pt in room #2 with her mother . Pt her today to f/u with her idiopathic intracranial hypertension.    HISTORY OF PRESENT ILLNESS:  05/24/22 ALL:  Teresa Kent is a 56 y.o. female here today for follow up for IIH. She was seen in consult with Dr Jaynee Eagles 03/2022 for IIH following ER visit 04/09/2022 where MRI showed empty sella and prominence of CSF ventral to pons and along optic nerves. CTA/CTV showed concerns for IIH. 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 No thrombosis. Ophthalmology note 05/03/2022 did not show any concerns of optic edema. LP 04/25/2022 showed normal opening pressure. She was experiencing head squeezing on Diamox and after low potassium was switched to topiramate. She reports not being comfortable with side effects of topiramate and has continued acetazolamide 134m QD-BID. She continues to feel swimmy headed and reports inability to lie flat. She has intermittent left eye twitching. She denies headaches. No vision changes or concerns of vision loss. [facility] and [facility]    HISTORY (copied from Dr ACathren Laineprevious note)  HPI:  Teresa CULVERHOUSEis a 56y.o. female here as requested by BElgie Congo MD for IDIOPATHIC INTRACRANIAL HYPERTENSION.  Started 2 months agian, strange pressure in her neck, waxes and wanes, has headaches left side of the head, in the ear, full ear like an ear plug, blurry vision , pressure in the head, no inciding events, no numbness, ni weakness, headaches are tolerable 2-3/10, she has an upcoming vision exam who is an ophthalmologist, no vision loss episodic blurry vision, better if she leans to one side positional, more pressure. No other focal neurologic deficits, associated symptoms, inciting events or modifiable factors.the diamox has helped. Also going to rheumatology for her elevated  ESR/CRP and is on steroids. No other focal neurologic deficits, associated symptoms, inciting events or modifiable factors.    REVIEW OF SYSTEMS: Out of a complete 14 system review of symptoms, the patient complains only of the following symptoms, head pressure, eye twitching, and all other reviewed systems are negative.   ALLERGIES: Allergies  Allergen Reactions   Prilosec [Omeprazole]    Simvastatin      HOME MEDICATIONS: Outpatient Medications Prior to Visit  Medication Sig Dispense Refill   acetaZOLAMIDE (DIAMOX) 125 MG tablet Take 125 mg by mouth 2 (two) times daily.     levothyroxine (SYNTHROID, LEVOTHROID) 200 MCG tablet Take 150 mcg by mouth daily.     Multiple Vitamin (MULTIVITAMIN) tablet Take 1 tablet by mouth 2 (two) times daily.       potassium chloride (KLOR-CON) 10 MEQ tablet Take 10 mEq by mouth 2 (two) times daily.     topiramate (TOPAMAX) 50 MG tablet Start with one pill at bedtime. In 1 week, If no side effects increase to one pill twice daily. 60 tablet 6   ondansetron (ZOFRAN-ODT) 8 MG disintegrating tablet Take 1 tablet (8 mg total) by mouth every 8 (eight) hours as needed for nausea or vomiting. (Patient not taking: Reported on 05/12/2022) 20 tablet 0   No facility-administered medications prior to visit.     PAST MEDICAL HISTORY: Past Medical History:  Diagnosis Date   Abnormal Pap smear 02/2003   Anemia    heavy menses   Endometriosis 02/2004   Fibroids, submucosal 02/2004   H/O: menorrhagia 02/2004   Hypertension    Hypothyroidism  Increased BMI 10/11/10   Ovarian cyst, right 03/19/2004   Simple endometrial hyperplasia 02/2004     PAST SURGICAL HISTORY: Past Surgical History:  Procedure Laterality Date   DILATION AND CURETTAGE OF UTERUS  03/19/2004   DILITATION & CURRETTAGE/HYSTROSCOPY WITH NOVASURE ABLATION N/A 12/20/2012   Procedure: DILATATION & CURETTAGE/HYSTEROSCOPY WITH NOVASURE ABLATION;  Surgeon: Delice Lesch, MD;  Location: [facility]  [facility];  Service: Gynecology;  Laterality: N/A;   OVARY SURGERY     resection of fibroid  03/19/2004   THYROID SURGERY     TUBAL LIGATION       FAMILY HISTORY: Family History  Problem Relation Age of Onset   Hypotension Mother    Breast cancer Maternal Grandmother    Diabetes Maternal Grandmother      SOCIAL HISTORY: Social History   Socioeconomic History   Marital status: Single    Spouse name: Not on file   Number of children: Not on file   Years of education: Not on file   Highest education level: Not on file  Occupational History   Not on file  Tobacco Use   Smoking status: Never   Smokeless tobacco: Not on file  Substance and Sexual Activity   Alcohol use: No   Drug use: No   Sexual activity: Never    Birth control/protection: Surgical  Other Topics Concern   Not on file  Social History Narrative   Right Handed   No Caffeine use    Social Determinants of Health   Financial Resource Strain: Not on file  Food Insecurity: Not on file  Transportation Needs: Not on file  Physical Activity: Not on file  Stress: Not on file  Social Connections: Not on file  Intimate Partner Violence: Not on file     PHYSICAL EXAM  Vitals:   05/12/22 0802  BP: 113/69  Pulse: 70  Weight: 227 lb 8 oz (103.2 kg)  Height: _0  (1.803 m)   Body mass index is 31.73 kg/m.  Generalized: Well developed, in no acute distress  Cardiology: normal rate and rhythm, no murmur auscultated  Respiratory: clear to auscultation bilaterally    Neurological examination  Mentation: Alert oriented to time, place, history taking. Follows all commands speech and language fluent Cranial nerve II-XII: Pupils were equal round reactive to light. Extraocular movements were full, visual field were full on confrontational test. Facial sensation and strength were normal. Head turning and shoulder shrug  were normal and symmetric. Motor: The motor testing reveals 5 over 5 strength of all 4  extremities. Good symmetric motor tone is noted throughout.  Sensory: Sensory testing is intact to soft touch on all 4 extremities. No evidence of extinction is noted.  Coordination: Cerebellar testing reveals good finger-nose-finger and heel-to-shin bilaterally.  Gait and station: Gait is normal.  Reflexes: Deep tendon reflexes are symmetric and normal bilaterally.    DIAGNOSTIC DATA (LABS, IMAGING, TESTING) - I reviewed patient records, labs, notes, testing and imaging myself where available.  Lab Results  Component Value Date   WBC 5.4 05/16/2022   HGB 14.0 05/16/2022   HCT 43.3 05/16/2022   MCV 90.4 05/16/2022   PLT 330 05/16/2022      Component Value Date/Time   NA 141 05/16/2022 1208   NA 134 05/01/2022 1112   K 3.5 05/16/2022 1208   CL 108 05/16/2022 1208   CO2 21 (L) 05/16/2022 1208   GLUCOSE 64 (L) 05/16/2022 1208   BUN 7 05/16/2022 1208   BUN 7 05/01/2022  1112   CREATININE 0.76 05/16/2022 1208   CALCIUM 9.5 05/16/2022 1208   GFRNONAA >60 05/16/2022 1208   GFRAA >90 12/18/2012 1100   No results found for: "CHOL", "HDL", "LDLCALC", "LDLDIRECT", "TRIG", "CHOLHDL" No results found for: "HGBA1C" Lab Results  Component Value Date   VITAMINB12 834 04/12/2022   Lab Results  Component Value Date   TSH 0.928 04/12/2022        No data to display               No data to display           ASSESSMENT AND PLAN  56 y.o. year old female  has a past medical history of Abnormal Pap smear (02/2003), Anemia, Endometriosis (02/2004), Fibroids, submucosal (02/2004), H/O: menorrhagia (02/2004), Hypertension, Hypothyroidism, Increased BMI (10/11/10), Ovarian cyst, right (03/19/2004), and Simple endometrial hyperplasia (02/2004). here with    IIH (idiopathic intracranial hypertension)  Brytney E Wheeless reports continued head pressure, intermittent left eye twitching and inability to lie flat. We have discussed results of imaging, LP and ophthalmology notes.  Neurologic workup shows possible IIH, however, no optic edema and normal opening pressure on LP. She does not wish to try topiramate. She is comfortable continuing acetazolamide 132m BID. [facility] I have encouraged her focus on weight management. Healthy lifestyle habits encouraged. She will follow up with PCP and ophthalmology as directed. She will return to see [place] [facility]. She verbalizes understanding and agreement with this plan.   No orders of the defined types were placed in this encounter.    No orders of the defined types were placed in this encounter.   I spent 30 minutes of face-to-face and non-face-to-face time with patient.  This included previsit chart review, lab review, study review, order entry, electronic health record documentation, patient education.   ADebbora Presto MSN, FNP-C 05/24/2022, 8:39 AM  [facility] [address] SRicevilleGMather [place] [phone]

## 2022-05-12 ENCOUNTER — Ambulatory Visit: Payer: Self-pay | Admitting: Family Medicine

## 2022-05-12 ENCOUNTER — Encounter: Payer: Self-pay | Admitting: Family Medicine

## 2022-05-12 ENCOUNTER — Encounter: Payer: Self-pay | Admitting: Neurology

## 2022-05-12 VITALS — BP 113/69 | HR 70 | Ht 71.0 in | Wt 227.5 lb

## 2022-05-12 DIAGNOSIS — G932 Benign intracranial hypertension: Secondary | ICD-10-CM

## 2022-05-12 NOTE — Telephone Encounter (Signed)
Pt saw Amy NP today in the office.

## 2022-05-12 NOTE — Patient Instructions (Signed)
Below is our plan:  We will continue acetazolamide '125mg'$  twice daily for now. Continue to monitor symptoms. Listen out for [facility] to call to schedule second opinion consult. Continue to follow up closely with your eye doctor and primary care.   Please make sure you are staying well hydrated. I recommend 50-60 ounces daily. Well balanced diet and regular exercise encouraged. Consistent sleep schedule with 6-8 hours recommended.   Please continue follow up with care team as directed.   Follow up with me pending visit with Jeanmarie Plant.   You may receive a survey regarding today's visit. I encourage you to leave honest feed back as I do use this information to improve patient care. Thank you for seeing me today!

## 2022-05-14 ENCOUNTER — Ambulatory Visit: Admit: 2022-05-14 | Discharge status: Against Medical Advice | Payer: BC Managed Care – PPO

## 2022-05-15 ENCOUNTER — Ambulatory Visit: Payer: BC Managed Care – PPO

## 2022-05-16 ENCOUNTER — Other Ambulatory Visit: Payer: Self-pay

## 2022-05-16 ENCOUNTER — Encounter (HOSPITAL_BASED_OUTPATIENT_CLINIC_OR_DEPARTMENT_OTHER): Payer: Self-pay | Admitting: Emergency Medicine

## 2022-05-16 ENCOUNTER — Emergency Department (HOSPITAL_BASED_OUTPATIENT_CLINIC_OR_DEPARTMENT_OTHER): Payer: BC Managed Care – PPO

## 2022-05-16 ENCOUNTER — Telehealth: Payer: BC Managed Care – PPO | Admitting: Physician Assistant

## 2022-05-16 ENCOUNTER — Emergency Department (HOSPITAL_BASED_OUTPATIENT_CLINIC_OR_DEPARTMENT_OTHER)
Admission: EM | Admit: 2022-05-16 | Discharge: 2022-05-16 | Disposition: A | Discharge status: Home / Self Care | Payer: BC Managed Care – PPO | Attending: Emergency Medicine | Admitting: Emergency Medicine

## 2022-05-16 ENCOUNTER — Encounter: Payer: Self-pay | Admitting: Physician Assistant

## 2022-05-16 DIAGNOSIS — R0789 Other chest pain: Secondary | ICD-10-CM | POA: Insufficient documentation

## 2022-05-16 DIAGNOSIS — R079 Chest pain, unspecified: Secondary | ICD-10-CM

## 2022-05-16 DIAGNOSIS — Z1152 Encounter for screening for COVID-19: Secondary | ICD-10-CM | POA: Insufficient documentation

## 2022-05-16 LAB — BASIC METABOLIC PANEL
Anion gap: 12 (ref 5–15)
BUN: 7 mg/dL (ref 6–20)
CO2: 21 mmol/L — ABNORMAL LOW (ref 22–32)
Calcium: 9.5 mg/dL (ref 8.9–10.3)
Chloride: 108 mmol/L (ref 98–111)
Creatinine, Ser: 0.76 mg/dL (ref 0.44–1.00)
GFR, Estimated: >60 mL/min (ref >60)
Glucose, Bld: 64 mg/dL — ABNORMAL LOW (ref 70–99)
Potassium: 3.5 mmol/L (ref 3.5–5.1)
Sodium: 141 mmol/L (ref 135–145)

## 2022-05-16 LAB — CBC
HCT: 43.3 % (ref 36.0–46.0)
Hemoglobin: 14 g/dL (ref 12.0–15.0)
MCH: 29.2 pg (ref 26.0–34.0)
MCHC: 32.3 g/dL (ref 30.0–36.0)
MCV: 90.4 fL (ref 80.0–100.0)
Platelets: 330 10*3/uL (ref 150–400)
RBC: 4.79 MIL/uL (ref 3.87–5.11)
RDW: 13.2 % (ref 11.5–15.5)
WBC: 5.4 10*3/uL (ref 4.0–10.5)
nRBC: 0 % (ref 0.0–0.2)

## 2022-05-16 LAB — RESP PANEL BY RT-PCR (RSV, FLU A&B, COVID)  RVPGX2
Influenza A by PCR: NEGATIVE
Influenza B by PCR: NEGATIVE
Resp Syncytial Virus by PCR: NEGATIVE
SARS Coronavirus 2 by RT PCR: NEGATIVE

## 2022-05-16 LAB — TROPONIN I (HIGH SENSITIVITY): Troponin I (High Sensitivity): <2 ng/L (ref <18)

## 2022-05-16 NOTE — Progress Notes (Signed)
Spoke with patient. Sent to nearest ER with family member.  CP -- episodic for 2 days. Today with L pain and pulling sensation with some pain in left neck. No SOB, LH, dizziness. Hx of IIH. Family Hx CAD.

## 2022-05-16 NOTE — Progress Notes (Signed)
Attempted to contact patient ahead of appt time giving concerning symptoms listed on her appointment notes (left-sided chest pain and arm weakness). Unable to get her via phone at number listed. Sent mychart message to her with ER precautions. Will also send link for video now in hopes she logs on early so we can assess her ASAP.

## 2022-05-16 NOTE — ED Provider Notes (Signed)
[facility] EMERGENCY DEPARTMENT Provider Note   CSN: [id number] Arrival date & time: 05/16/22  1134     History  Chief Complaint  Patient presents with   Chest Pain    Teresa Kent is a 56 y.o. female with a past medical history of idiopathic intracranial hypertension presenting today with chest pain.  She says it has been intermittent for 2 days.  She was seen at urgent care and they told her they thought it was GERD and gave her PPI.  She does not believe this has helped her and also feels as though it may have made the burning in the left side of her chest worse.  Says that the pain in the left chest is dull and she occasionally has fullness in the left side of her neck and chin.  No history of ACS.  No history of diabetes, hyperlipidemia or hypertension.  Of note, she is established with cardiology and is supposed to have a cardiac CT later this week.   IIH  Chest Pain      Home Medications Prior to Admission medications   Medication Sig Start Date End Date Taking? Authorizing Provider  acetaZOLAMIDE (DIAMOX) 125 MG tablet Take 125 mg by mouth 2 (two) times daily.    [provider]  levothyroxine (SYNTHROID, LEVOTHROID) 200 MCG tablet Take 150 mcg by mouth daily.    [provider]  Multiple Vitamin (MULTIVITAMIN) tablet Take 1 tablet by mouth 2 (two) times daily.      [provider]  ondansetron (ZOFRAN-ODT) 8 MG disintegrating tablet Take 1 tablet (8 mg total) by mouth every 8 (eight) hours as needed for nausea or vomiting. Patient not taking: Reported on 05/12/2022 05/01/22   Enrique Sack, FNP  potassium chloride (KLOR-CON) 10 MEQ tablet Take 10 mEq by mouth 2 (two) times daily. 04/29/22   [provider]      Allergies    Prilosec [omeprazole] and Simvastatin    Review of Systems   Review of Systems  Cardiovascular:  Positive for chest pain.    Physical Exam Updated Vital Signs BP (!) 147/78   Pulse (!)  121   Temp 98.7 F (37.1 C) (Oral)   Resp (!) 24   Ht '5\' 11"'$  (1.803 m)   Wt 101.6 kg   LMP 10/31/2019   SpO2 100%   BMI 31.24 kg/m  Physical Exam Vitals and nursing note reviewed.  Constitutional:      General: She is not in acute distress.    Appearance: Normal appearance. She is not ill-appearing.  HENT:     Head: Normocephalic and atraumatic.  Eyes:     General: No scleral icterus.    Conjunctiva/sclera: Conjunctivae normal.  Cardiovascular:     Rate and Rhythm: Normal rate and regular rhythm.     Heart sounds: Normal heart sounds.  Pulmonary:     Effort: Pulmonary effort is normal. No respiratory distress.     Breath sounds: Normal breath sounds. No decreased breath sounds or wheezing.  Chest:     Chest wall: No mass or tenderness.  Musculoskeletal:     Right lower leg: No edema.     Left lower leg: No edema.     Comments: Somewhat reproducible tenderness to patient's left lateral rib cage  Skin:    General: Skin is warm and dry.     Findings: No rash.  Neurological:     Mental Status: She is alert.  Psychiatric:  Mood and Affect: Mood normal.     ED Results / Procedures / Treatments   Labs (all labs ordered are listed, but only abnormal results are displayed) Labs Reviewed  BASIC METABOLIC PANEL - Abnormal; Notable for the following components:      Result Value   CO2 21 (*)    Glucose, Bld 64 (*)    All other components within normal limits  RESP PANEL BY RT-PCR (RSV, FLU A&B, COVID)  RVPGX2  CBC  PREGNANCY, URINE  TROPONIN I (HIGH SENSITIVITY)  TROPONIN I (HIGH SENSITIVITY)    EKG EKG Interpretation  Date/Time:  Monday May 16 2022 11:50:02 EST Ventricular Rate:  78 PR Interval:  164 QRS Duration: 82 QT Interval:  384 QTC Calculation: 437 R Axis:   -32 Text Interpretation: Normal sinus rhythm Left axis deviation Minimal voltage criteria for LVH, may be normal variant ( R in aVL ) Nonspecific T wave abnormality Abnormal ECG When  compared with ECG of 03-Apr-2022 08:26, No significant change since last tracing Confirmed by Aletta Edouard [phone]) on 05/16/2022 12:02:59 PM  Radiology DG Chest 2 View  Result Date: 05/16/2022 CLINICAL DATA:  Chest pain EXAM: CHEST - 2 VIEW COMPARISON:  None Available. FINDINGS: The heart size and mediastinal contours are within normal limits. Both lungs are clear. The visualized skeletal structures are unremarkable. IMPRESSION: No active cardiopulmonary disease. Electronically Signed   By: Yetta Glassman M.D.   On: 05/16/2022 12:10    Procedures Procedures   Medications Ordered in ED Medications - No data to display  ED Course/ Medical Decision Making/ A&P                           Medical Decision Making Amount and/or Complexity of Data Reviewed Labs: ordered. Radiology: ordered.   56 year old female presented today with chest pain. The emergent differential diagnosis of chest pain includes: Acute coronary syndrome, pericarditis, aortic dissection, pulmonary embolism, tension pneumothorax, and esophageal rupture.  This is not an exhaustive differential.    Past Medical History / Co-morbidities / Social History: IIH   Additional history:    Physical Exam: Pertinent physical exam findings include Originally tachycardic however patient reports being anxious.  This resolved with time to around 83  Lab Tests: I ordered, and personally interpreted labs.  The pertinent results include: Unremarkable   Imaging Studies: Chest x-ray ordered in triage.  Viewed and interpreted by me and I agree with radiology that there are no acute findings  Cardiac Monitoring:  The patient was maintained on a cardiac monitor.  I viewed and interpreted the cardiac monitored which showed an underlying rhythm of: Normal sinus rhythm   MDM/Disposition: This is a 56 year old female who presented today with chest pain.  She reported that it was "burning".  She said it somewhat is made worse  with bending forward.  No risk factors for ACS.  Heart score of 1, slowly due to patient's age.  I have some suspicion for reflux given her "burning feeling" and statements as though she feels like something is getting stuck in her throat.  Also may be musculoskeletal as it is somewhat reproducible to the left lateral rib cage and when the patient flexes her trunk.  Regardless, I believe we have thoroughly ruled out ACS at this time.  Unable to apply PERC criteria due to her age however she is low likelihood Wells PE score.  She was tachycardic on arrival but she did tell me that  she was extremely anxious.  For the last hour and a half of her visit her heart rate did not climb higher than mid 80s.  She is agreeable to discharge with her close cardiology follow-up.  All questions answered, ambulated out of the department   Final Clinical Impression(s) / ED Diagnoses Final diagnoses:  Chest pain, unspecified type    Rx / DC Orders ED Discharge Orders     None      Results and diagnoses were explained to the patient. Return precautions discussed in full. Patient had no additional questions and expressed complete understanding.   This chart was dictated using voice recognition software.  Despite best efforts to proofread,  errors can occur which can change the documentation meaning.    Darliss Ridgel 05/16/22 1522    Hayden Rasmussen, MD 05/17/22 [phone]

## 2022-05-16 NOTE — ED Notes (Signed)
2 unsuccessful phlebotomy attempts.  

## 2022-05-16 NOTE — Discharge Instructions (Addendum)
You came to the emergency department today due to chest pain.  Your heart enzyme is normal.  Your EKG is also normal.  I am glad you already have a cardiology appointment within the next week, please do not cancel this.  Return to the emergency department with any worsening symptoms.  As we discussed, you may use Tylenol for any discomfort as well as heat packs.  It was a pleasure to meet you, Merry Christmas!

## 2022-05-16 NOTE — ED Triage Notes (Signed)
Pt arrives pov, steady gait, c/o LT side CP since last night that stared while sitting. Also reports bilateral arm weakness and LT jaw pain last night. Was tx at [facility] for acid reflux. Pt denies shob or pain radiation at this time

## 2022-05-16 NOTE — ED Notes (Signed)
Patient is shaking in bed. States she always has tremors but today and last night, same was worse. She is unable to sit in bed for a time period and has to stand up and pace. Asked about anxiety but states she has no hx of same.

## 2022-05-16 NOTE — Progress Notes (Signed)
Duplicate encounter. Patient canceled prior 11 appointment after talking to this provider via the phone regarding her chest pain so unable to complete documentation in that encounter. 2 days of episodic left sided chest pain with pulling sensation. No SOB, LH, dizziness. Some heartburn after use of antiinflammatory. Notes left arm weakness. ER disposition given. Family member taking her to ER now.  No charge for visit.

## 2022-05-17 ENCOUNTER — Encounter: Payer: Self-pay | Admitting: Cardiology

## 2022-05-18 ENCOUNTER — Other Ambulatory Visit: Payer: BC Managed Care – PPO

## 2022-05-18 ENCOUNTER — Telehealth (HOSPITAL_COMMUNITY): Payer: Self-pay | Admitting: Emergency Medicine

## 2022-05-18 MED ORDER — METOPROLOL TARTRATE 25 MG PO TABS: ORAL_TABLET | ORAL | 0 refills | Status: DC

## 2022-05-18 NOTE — Telephone Encounter (Signed)
Spoke with patient to advise her she will need to take metoprolol tartrate 50mg 2 hours before her procedure tomorrow. She stated she did not want to take the extra 25 mg because she is afraid how it will affect her idiopathic intracranial hypertension. Explained the purpose of met tart. Order placed at [facility] on [address] for metoprolol tartrate 25mg in case she decides to take it. 

## 2022-05-18 NOTE — Telephone Encounter (Signed)
Reaching out to patient to offer assistance regarding upcoming cardiac imaging study; pt verbalizes understanding of appt date/time, parking situation and where to check in, pre-test NPO status and medications ordered, and verified current allergies; name and call back number provided for further questions should they arise Teresa Bond RN Navigator Cardiac Imaging [facility] [phone] office [phone] cell  Arrival 1200 '25mg'$  metoprolol tartrate Deneis iv isssues

## 2022-05-19 ENCOUNTER — Ambulatory Visit (HOSPITAL_COMMUNITY)
Admission: RE | Admit: 2022-05-19 | Discharge: 2022-05-19 | Disposition: A | Discharge status: Home / Self Care | Payer: Self-pay | Source: Ambulatory Visit | Attending: Cardiology | Admitting: Cardiology

## 2022-05-19 DIAGNOSIS — R072 Precordial pain: Secondary | ICD-10-CM | POA: Insufficient documentation

## 2022-05-19 MED ORDER — NITROGLYCERIN 0.4 MG SL SUBL
0.8000 mg | SUBLINGUAL_TABLET | Freq: Once | SUBLINGUAL | Status: AC
  Administered 2022-05-19: 0.8 mg via SUBLINGUAL

## 2022-05-19 MED ORDER — IOHEXOL 350 MG/ML SOLN
95.0000 mL | Freq: Once | INTRAVENOUS | Status: AC | PRN
  Administered 2022-05-19: 95 mL via INTRAVENOUS

## 2022-05-19 MED ORDER — NITROGLYCERIN 0.4 MG SL SUBL
SUBLINGUAL_TABLET | SUBLINGUAL | Status: AC
  Filled 2022-05-19: qty 2

## 2022-05-20 ENCOUNTER — Encounter: Payer: Self-pay | Admitting: Cardiology

## 2022-05-21 ENCOUNTER — Encounter: Payer: Self-pay | Admitting: Cardiology

## 2022-05-21 ENCOUNTER — Encounter: Payer: Self-pay | Admitting: Family Medicine

## 2022-05-23 DIAGNOSIS — Z9289 Personal history of other medical treatment: Secondary | ICD-10-CM

## 2022-05-23 HISTORY — DX: Personal history of other medical treatment: Z92.89

## 2022-05-25 ENCOUNTER — Ambulatory Visit (HOSPITAL_COMMUNITY): Payer: BC Managed Care – PPO

## 2022-05-26 ENCOUNTER — Ambulatory Visit: Payer: No Typology Code available for payment source

## 2022-05-30 ENCOUNTER — Telehealth: Payer: Self-pay | Admitting: Family Medicine

## 2022-05-30 ENCOUNTER — Other Ambulatory Visit: Payer: Self-pay | Admitting: Neurology

## 2022-05-30 DIAGNOSIS — G932 Benign intracranial hypertension: Secondary | ICD-10-CM

## 2022-05-30 NOTE — Telephone Encounter (Signed)
Referral for Neurosurgery fax to [facility]. Phone: [phone], Fax: [phone]

## 2022-05-31 NOTE — Telephone Encounter (Signed)
Pt requesting an appt for head pressure after having Covid,. Pt said, Dr. Jaynee Eagles told her to schedule an appt.  Informed pt will send to nurse to discuss.  Did not see a note from Dr. Jaynee Eagles stating to schedule an appt. Sending to Pod 1 due to Amy, NP last provider seen pt.

## 2022-06-01 NOTE — Telephone Encounter (Signed)
Per Amy "I have told her in Mychart message that pressure in head following Covid was very common and could takes weeks to resolve. I recommend she see PCP for post viral symptoms. I am not opposed to seeing her back for headaches but just recently saw her and if no changes, I do not have any further recommendations."

## 2022-06-02 DIAGNOSIS — N920 Excessive and frequent menstruation with regular cycle: Secondary | ICD-10-CM | POA: Insufficient documentation

## 2022-06-02 DIAGNOSIS — N7011 Chronic salpingitis: Secondary | ICD-10-CM | POA: Insufficient documentation

## 2022-06-02 DIAGNOSIS — I10 Essential (primary) hypertension: Secondary | ICD-10-CM | POA: Insufficient documentation

## 2022-06-02 DIAGNOSIS — E559 Vitamin D deficiency, unspecified: Secondary | ICD-10-CM | POA: Insufficient documentation

## 2022-06-02 DIAGNOSIS — D259 Leiomyoma of uterus, unspecified: Secondary | ICD-10-CM | POA: Insufficient documentation

## 2022-06-02 DIAGNOSIS — E669 Obesity, unspecified: Secondary | ICD-10-CM | POA: Insufficient documentation

## 2022-06-02 DIAGNOSIS — D649 Anemia, unspecified: Secondary | ICD-10-CM | POA: Insufficient documentation

## 2022-06-02 DIAGNOSIS — L282 Other prurigo: Secondary | ICD-10-CM | POA: Insufficient documentation

## 2022-06-02 DIAGNOSIS — R87619 Unspecified abnormal cytological findings in specimens from cervix uteri: Secondary | ICD-10-CM | POA: Insufficient documentation

## 2022-06-02 DIAGNOSIS — E039 Hypothyroidism, unspecified: Secondary | ICD-10-CM | POA: Insufficient documentation

## 2022-06-04 ENCOUNTER — Telehealth: Payer: Medicaid Other | Admitting: Nurse Practitioner

## 2022-06-04 DIAGNOSIS — J069 Acute upper respiratory infection, unspecified: Secondary | ICD-10-CM

## 2022-06-04 MED ORDER — CHLORPHEN-PE-ACETAMINOPHEN 4-10-325 MG PO TABS: 1.0000 | ORAL_TABLET | Freq: Four times a day (QID) | ORAL | 0 refills | Status: DC | PRN

## 2022-06-04 NOTE — Progress Notes (Signed)
Virtual Visit Consent   Teresa Kent, you are scheduled for a virtual visit with Mary-Margaret Hassell Done, [place], a [facility] provider, today.     Just as with appointments in the office, your consent must be obtained to participate.  Your consent will be active for this visit and any virtual visit you may have with one of our providers in the next 365 days.     If you have a MyChart account, a copy of this consent can be sent to you electronically.  All virtual visits are billed to your insurance company just like a traditional visit in the office.    As this is a virtual visit, video technology does not allow for your provider to perform a traditional examination.  This may limit your provider's ability to fully assess your condition.  If your provider identifies any concerns that need to be evaluated in person or the need to arrange testing (such as labs, EKG, etc.), we will make arrangements to do so.     Although advances in technology are sophisticated, we cannot ensure that it will always work on either your end or our end.  If the connection with a video visit is poor, the visit may have to be switched to a telephone visit.  With either a video or telephone visit, we are not always able to ensure that we have a secure connection.     I need to obtain your verbal consent now.   Are you willing to proceed with your visit today? YES   Deniqua E Ritson has provided verbal consent on 06/04/2022 for a virtual visit (video or telephone).   Mary-Margaret Hassell Done, FNP   Date: 06/04/2022 10:30 AM   Virtual Visit via Video Note   I, Mary-Margaret Jalynn Betzold, connected with Teresa Kent ([id number], 1966/02/19) on 06/04/22 at 11:00 AM EST by a video-enabled telemedicine application and verified that I am speaking with the correct person using two identifiers.  Location: Patient: Virtual Visit Location Patient: Home Provider: Virtual Visit Location Provider: Mobile   I discussed the  limitations of evaluation and management by telemedicine and the availability of in person appointments. The patient expressed understanding and agreed to proceed.    History of Present Illness: Teresa Kent is a 57 y.o. who identifies as a female who was assigned female at birth, and is being seen today for video.  HPI: Paient tested positive for covid on 05/24/21. She was put on paxlovid. She got better   URI  This is a new problem. Episode onset: 2days. The problem has been waxing and waning. There has been no fever. Associated symptoms include congestion, rhinorrhea and sneezing. Pertinent negatives include no coughing. She has tried nothing for the symptoms. The treatment provided mild relief.    Review of Systems  HENT:  Positive for congestion, rhinorrhea and sneezing.   Respiratory:  Negative for cough.     Problems:  Patient Active Problem List   Diagnosis Date Noted   IIH (idiopathic intracranial hypertension) 04/19/2022   Hidradenitis suppurativa 12/19/2011    Allergies:  Allergies  Allergen Reactions   Prilosec [Omeprazole]    Simvastatin    Medications:  Current Outpatient Medications:    acetaZOLAMIDE (DIAMOX) 125 MG tablet, Take 125 mg by mouth 2 (two) times daily., Disp: , Rfl:    levothyroxine (SYNTHROID, LEVOTHROID) 200 MCG tablet, Take 150 mcg by mouth daily., Disp: , Rfl:    metoprolol tartrate (LOPRESSOR) 25 MG tablet, Take this '25mg'$   tablet with the 25 mg tablet you already have to total '50mg'$ . Take both tablets 2 hours before your procedure., Disp: 1 tablet, Rfl: 0   Multiple Vitamin (MULTIVITAMIN) tablet, Take 1 tablet by mouth 2 (two) times daily.  , Disp: , Rfl:    ondansetron (ZOFRAN-ODT) 8 MG disintegrating tablet, Take 1 tablet (8 mg total) by mouth every 8 (eight) hours as needed for nausea or vomiting. (Patient not taking: Reported on 05/12/2022), Disp: 20 tablet, Rfl: 0   potassium chloride (KLOR-CON) 10 MEQ tablet, Take 10 mEq by mouth 2 (two)  times daily., Disp: , Rfl:   Observations/Objective: Patient is well-developed, well-nourished in no acute distress.  Resting comfortably  at home.  Head is normocephalic, atraumatic.  No labored breathing.  Speech is clear and coherent with logical content.  Patient is alert and oriented at baseline.  Raspy voice  Assessment and Plan:  Matina E Zuver in today with chief complaint of No chief complaint on file.   1. URI with cough and congestion 1. Take meds as prescribed 2. Use a cool mist humidifier especially during the winter months and when heat has been humid. 3. Use saline nose sprays frequently 4. Saline irrigations of the nose can be very helpful if done frequently.  * 4X daily for 1 week*  * Use of a nettie pot can be helpful with this. Follow directions with this* 5. Drink plenty of fluids 6. Keep thermostat turn down low 7.For any cough or congestion- nomrel ad 8. For fever or aces or pains- take tylenol or ibuprofen appropriate for age and weight.  * for fevers greater than 101 orally you may alternate ibuprofen and tylenol every  3 hours.     Meds ordered this encounter  Medications   Chlorphen-PE-Acetaminophen 4-10-325 MG TABS    Sig: Take 1 tablet by mouth every 6 (six) hours as needed.    Dispense:  20 tablet    Refill:  0    Order Specific Question:   Supervising Provider    Answer:   Chase Picket [id number]      Follow Up Instructions: I discussed the assessment and treatment plan with the patient. The patient was provided an opportunity to ask questions and all were answered. The patient agreed with the plan and demonstrated an understanding of the instructions.  A copy of instructions were sent to the patient via MyChart.  The patient was advised to call back or seek an in-person evaluation if the symptoms worsen or if the condition fails to improve as anticipated.  Time:  I spent 8 minutes with the patient via telehealth technology  discussing the above problems/concerns.    Mary-Margaret Hassell Done, FNP vdeo

## 2022-06-04 NOTE — Patient Instructions (Signed)
Teresa Kent, thank you for joining Chevis Pretty, FNP for today's virtual visit.  While this provider is not your primary care provider (PCP), if your PCP is located in our provider database this encounter information will be shared with them immediately following your visit.   [facility] account gives you access to today's visit and all your visits, tests, and labs performed at [facility] " click here if you don't have a [facility] account or go to [email or web address]  Consent: (Patient) Teresa Kent provided verbal consent for this virtual visit at the beginning of the encounter.  Current Medications:  Current Outpatient Medications:    Chlorphen-PE-Acetaminophen 4-10-325 MG TABS, Take 1 tablet by mouth every 6 (six) hours as needed., Disp: 20 tablet, Rfl: 0   acetaZOLAMIDE (DIAMOX) 125 MG tablet, Take 125 mg by mouth 2 (two) times daily., Disp: , Rfl:    levothyroxine (SYNTHROID, LEVOTHROID) 200 MCG tablet, Take 150 mcg by mouth daily., Disp: , Rfl:    metoprolol tartrate (LOPRESSOR) 25 MG tablet, Take this '25mg'$  tablet with the 25 mg tablet you already have to total '50mg'$ . Take both tablets 2 hours before your procedure., Disp: 1 tablet, Rfl: 0   Multiple Vitamin (MULTIVITAMIN) tablet, Take 1 tablet by mouth 2 (two) times daily.  , Disp: , Rfl:    ondansetron (ZOFRAN-ODT) 8 MG disintegrating tablet, Take 1 tablet (8 mg total) by mouth every 8 (eight) hours as needed for nausea or vomiting. (Patient not taking: Reported on 05/12/2022), Disp: 20 tablet, Rfl: 0   potassium chloride (KLOR-CON) 10 MEQ tablet, Take 10 mEq by mouth 2 (two) times daily., Disp: , Rfl:    Medications ordered in this encounter:  Meds ordered this encounter  Medications   Chlorphen-PE-Acetaminophen 4-10-325 MG TABS    Sig: Take 1 tablet by mouth every 6 (six) hours as needed.    Dispense:  20 tablet    Refill:  0    Order Specific Question:   Supervising  Provider    Answer:   Chase Picket [id number]     *If you need refills on other medications prior to your next appointment, please contact your pharmacy*  Follow-Up: Call back or seek an in-person evaluation if the symptoms worsen or if the condition fails to improve as anticipated.  [place] [phone]  Other Instructions 1. Take meds as prescribed 2. Use a cool mist humidifier especially during the winter months and when heat has been humid. 3. Use saline nose sprays frequently 4. Saline irrigations of the nose can be very helpful if done frequently.  * 4X daily for 1 week*  * Use of a nettie pot can be helpful with this. Follow directions with this* 5. Drink plenty of fluids 6. Keep thermostat turn down low 7.For any cough or congestion- norelAD 8. For fever or aces or pains- take tylenol or ibuprofen appropriate for age and weight.  * for fevers greater than 101 orally you may alternate ibuprofen and tylenol every  3 hours.      If you have been instructed to have an in-person evaluation today at a local Urgent Care facility, please use the link below. It will take you to a list of all of our available [facility], including address, phone number and hours of operation. Please do not delay care.  [facility]  If you or a family member do not have a primary care provider, use  the link below to schedule a visit and establish care. When you choose a [facility] primary care physician or advanced practice provider, you gain a long-term partner in health. Find a Primary Care Provider  Learn more about [facility]'s in-office and virtual care options: [facility]

## 2022-06-06 ENCOUNTER — Ambulatory Visit: Payer: Self-pay

## 2022-06-07 ENCOUNTER — Other Ambulatory Visit: Payer: No Typology Code available for payment source

## 2022-06-07 ENCOUNTER — Ambulatory Visit (INDEPENDENT_AMBULATORY_CARE_PROVIDER_SITE_OTHER): Payer: Medicaid Other | Admitting: Neurology

## 2022-06-07 ENCOUNTER — Encounter: Payer: Self-pay | Admitting: Neurology

## 2022-06-07 VITALS — BP 123/76 | HR 62 | Ht 71.0 in | Wt 225.8 lb

## 2022-06-07 DIAGNOSIS — M4802 Spinal stenosis, cervical region: Secondary | ICD-10-CM

## 2022-06-07 DIAGNOSIS — R519 Headache, unspecified: Secondary | ICD-10-CM | POA: Diagnosis not present

## 2022-06-07 DIAGNOSIS — G8929 Other chronic pain: Secondary | ICD-10-CM

## 2022-06-07 MED ORDER — QULIPTA 60 MG PO TABS: 60.0000 mg | ORAL_TABLET | Freq: Every day | ORAL | 0 refills | Status: DC

## 2022-06-07 NOTE — Progress Notes (Signed)
Chief Complaint  Patient presents with   Follow-up    Rm 18, alone.  Has seen [facility]. Milas Kocher for second opinion, did not think IIH.      HISTORY OF PRESENT ILLNESS:  06/07/2022: She was referred to [place] for IDIOPATHIC INTRACRANIAL HYPERTENSION due to consistent findings on MRI and MRV/CTV leading partially empty sella, expanded optic nerve sheaths, stenosis of cerebral sinus veins that can be seen in the setting of idiopathic intracranial hypertension, unfortunately when we saw her she was already placed on Diamox making a workup very difficult  opening pressure was normal at 10 (unfortunately she was already placed on diamox before we even saw her and the opening pressure could have been inacurate), and also ophthalmology exam was normal (she was already on Diamox) but also after being on diamox. Her headache is across the temples and if she moves her head she can feel a pain and fluid and her ears feel full and she has congestion may have a sinus infection. No migrainous feautures. Last time she took the diamox was weeks ago. She was feeling beter until covid and she has started feeling bad again with pressure since covid. Lots of pressure in the head.    May 07 2022: MRI cervical spine  EXAM: MRI CERVICAL SPINE WITHOUT CONTRAST  TECHNIQUE: Multiplanar, multisequence MR imaging of the cervical spine was performed. No intravenous contrast was administered.  COMPARISON: None Available.  FINDINGS: Alignment: Straightening. No substantial sagittal subluxation.  Vertebrae: No fracture, evidence of discitis, or bone lesion.  Cord: Normal cord signal.  Posterior Fossa, vertebral arteries, paraspinal tissues: Visualized vertebral artery flow voids maintained. Unremarkable visualized posterior fossa. Enlarged and partially empty sella.  Disc levels:  Question ossification of posterior longitudinal ligament, particularly lower cervical spine.  C2-C3: Small posterior disc osteophyte  complex without significant stenosis.  C3-C4: Posterior disc osteophyte complex with bilateral facet and uncovertebral hypertrophy. No significant canal or foraminal stenosis.  C4-C5: Posterior disc osteophyte complex with facet and uncovertebral hypertrophy. Resulting mild-to-moderate canal on mild-to-moderate bilateral foraminal stenosis.  C5-C6: Posterior disc osteophyte complex with bilateral uncovertebral hypertrophy. Mild canal stenosis. Patent foramina.  C6-C7: Posterior disc osteophyte complex with bilateral uncovertebral hypertrophy. Mild bilateral foraminal stenosis without significant stenosis.  C7-T1: Left posterior disc osteophyte complex and uncovertebral hypertrophy. Patent canal and foramina.  IMPRESSION: 1. 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 Mild-to-moderate bilateral foraminal stenosis C4-C5. A CT of the cervical spine may be useful to assess for OPLL, if clinically warranted. 2. Enlarged and partially empty sella, which is often a normal anatomic variant but can be associated with idiopathic intracranial hypertension (pseudotumor cerebri).   Electronically Signed By: Teresa Kent M.D. On: 05/09/2022 12:03   06/07/22 ALL:  Teresa Kent is a 57 y.o. female here today for follow up for IIH. She was seen in consult with Teresa Kent 03/2022 for IIH following ER visit 04/09/2022 where MRI showed empty sella and prominence of CSF ventral to pons and along optic nerves. CTA/CTV showed concerns for IIH. 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 No thrombosis. Ophthalmology note 05/03/2022 did not show any concerns of optic edema. LP 04/25/2022 showed normal opening pressure. She was experiencing head squeezing on Diamox and after low potassium was switched to topiramate. She  reports not being comfortable with side effects of topiramate and has continued  acetazolamide '125mg'$  QD-BID. She continues to feel swimmy headed and reports inability to lie flat. She has intermittent left eye twitching. She denies headaches. No vision changes or concerns of vision loss. She requested a referral to [facility] specialists and currently awaiting appt with [facility] neurology.    HISTORY (copied from Teresa Kent previous note)  HPI:  Teresa Kent is a 57 y.o. female here as requested by Teresa Kent for IDIOPATHIC INTRACRANIAL HYPERTENSION.  Started 2 months agian, strange pressure in her neck, waxes and wanes, has headaches left side of the head, in the ear, full ear like an ear plug, blurry vision , pressure in the head, no inciding events, no numbness, ni weakness, headaches are tolerable 2-3/10, she has an upcoming vision exam who is an ophthalmologist, no vision loss episodic blurry vision, better if she leans to one side positional, more pressure. No other focal neurologic deficits, associated symptoms, inciting events or modifiable factors.the diamox has helped. Also going to rheumatology for her elevated ESR/CRP and is on steroids. No other focal neurologic deficits, associated symptoms, inciting events or modifiable factors.    REVIEW OF SYSTEMS: Out of a complete 14 system review of symptoms, the patient complains only of the following symptoms, head pressure, eye twitching, and all other reviewed systems are negative.   ALLERGIES: Allergies  Allergen Reactions   Prilosec [Omeprazole]    Simvastatin      HOME MEDICATIONS: Outpatient Medications Prior to Visit  Medication Sig Dispense Refill   amoxicillin-clavulanate (AUGMENTIN) 875-125 MG tablet Take 1 tablet by mouth 2 (two) times daily.     levothyroxine (SYNTHROID) 150 MCG tablet Take 150 mcg by mouth daily before breakfast.     Multiple Vitamin (MULTIVITAMIN) tablet Take 1 tablet by mouth 2 (two) times daily.        ondansetron (ZOFRAN-ODT) 8 MG disintegrating tablet Take 1 tablet (8 mg total) by mouth every 8 (eight) hours as needed for nausea or vomiting. 20 tablet 0   potassium chloride (KLOR-CON) 10 MEQ tablet Take 10 mEq by mouth 2 (two) times daily.     acetaZOLAMIDE (DIAMOX) 125 MG tablet Take 125 mg by mouth 2 (two) times daily. (Patient not taking: Reported on 06/07/2022)     Chlorphen-PE-Acetaminophen 4-10-325 MG TABS Take 1 tablet by mouth every 6 (six) hours as needed. 20 tablet 0   levothyroxine (SYNTHROID, LEVOTHROID) 200 MCG tablet Take 150 mcg by mouth daily.     metoprolol tartrate (LOPRESSOR) 25 MG tablet Take this '25mg'$  tablet with the 25 mg tablet you already have to total '50mg'$ . Take both tablets 2 hours before your procedure. 1 tablet 0   No facility-administered medications prior to visit.     PAST MEDICAL HISTORY: Past Medical History:  Diagnosis Date   Abnormal Pap smear 02/2003   Anemia    heavy menses   Endometriosis 02/2004   Fibroids, submucosal 02/2004   H/O: menorrhagia 02/2004   Hypertension    Hypothyroidism    Increased BMI 10/11/10   Ovarian cyst, right 03/19/2004   Simple endometrial hyperplasia 02/2004     PAST SURGICAL HISTORY: Past Surgical History:  Procedure Laterality Date   DILATION AND CURETTAGE OF UTERUS  03/19/2004   DILITATION & CURRETTAGE/HYSTROSCOPY WITH NOVASURE ABLATION N/A 12/20/2012   Procedure: DILATATION & CURETTAGE/HYSTEROSCOPY WITH NOVASURE ABLATION;  Surgeon: Delice Lesch, Kent;  Location: [facility];  Service: Gynecology;  Laterality: N/A;   OVARY SURGERY     resection of fibroid  03/19/2004  THYROID SURGERY     TUBAL LIGATION       FAMILY HISTORY: Family History  Problem Relation Age of Onset   Hypotension Mother    Breast cancer Maternal Grandmother    Diabetes Maternal Grandmother      SOCIAL HISTORY: Social History   Socioeconomic History   Marital status: Single    Spouse name: Not on file   Number of children:  Not on file   Years of education: Not on file   Highest education level: Not on file  Occupational History   Not on file  Tobacco Use   Smoking status: Never   Smokeless tobacco: Not on file  Substance and Sexual Activity   Alcohol use: No   Drug use: No   Sexual activity: Never    Birth control/protection: Surgical  Other Topics Concern   Not on file  Social History Narrative   Right Handed   No Caffeine use    Social Determinants of Health   Financial Resource Strain: Not on file  Food Insecurity: Not on file  Transportation Needs: Not on file  Physical Activity: Not on file  Stress: Not on file  Social Connections: Not on file  Intimate Partner Violence: Not on file     PHYSICAL EXAM  Vitals:   06/07/22 1306  BP: 123/76  Pulse: 62  Weight: 225 lb 12.8 oz (102.4 kg)  Height: '5\' 11"'$  (1.803 m)   Body mass index is 31.49 kg/m. Physical exam: Exam: Gen: NAD, conversant, well nourised, obese, well groomed                     CV: RRR, no MRG. No Carotid Bruits. No peripheral edema, warm, nontender Eyes: Conjunctivae clear without exudates or hemorrhage  Neuro: Detailed Neurologic Exam  Speech:    Speech is normal; fluent and spontaneous with normal comprehension.  Cognition:    The patient is oriented to person, place, and time;     recent and remote memory intact;     language fluent;     normal attention, concentration,     fund of knowledge Cranial Nerves:    The pupils are equal, round, and reactive to light. The fundi are normal and spontaneous venous pulsations are present. Visual fields are full to finger confrontation. Extraocular movements are intact. Trigeminal sensation is intact and the muscles of mastication are normal. The face is symmetric. The palate elevates in the midline. Hearing intact. Voice is normal. Shoulder shrug is normal. The tongue has normal motion without fasciculations.   Coordination:    Normal finger to nose and heel to shin.  Normal rapid alternating movements.   Gait:    Heel-toe and tandem gait are normal.   Motor Observation:    No asymmetry, no atrophy, and no involuntary movements noted. Tone:    Normal muscle tone.    Posture:    Posture is normal. normal erect    Strength:    Strength is V/V in the upper and lower limbs.      Sensation: intact to LT     Reflex Exam:  DTR's:    Deep tendon reflexes in the upper and lower extremities are normal bilaterally.   Toes:    The toes are downgoing bilaterally.   Clonus:    Clonus is absent.   DIAGNOSTIC DATA (LABS, IMAGING, TESTING) - I reviewed patient records, labs, notes, testing and imaging myself where available.  Lab Results  Component Value Date  WBC 5.4 05/16/2022   HGB 14.0 05/16/2022   HCT 43.3 05/16/2022   MCV 90.4 05/16/2022   PLT 330 05/16/2022      Component Value Date/Time   NA 141 05/16/2022 1208   NA 134 05/01/2022 1112   K 3.5 05/16/2022 1208   CL 108 05/16/2022 1208   CO2 21 (L) 05/16/2022 1208   GLUCOSE 64 (L) 05/16/2022 1208   BUN 7 05/16/2022 1208   BUN 7 05/01/2022 1112   CREATININE 0.76 05/16/2022 1208   CALCIUM 9.5 05/16/2022 1208   GFRNONAA >60 05/16/2022 1208   GFRAA >90 12/18/2012 1100   No results found for: "CHOL", "HDL", "LDLCALC", "LDLDIRECT", "TRIG", "CHOLHDL" No results found for: "HGBA1C" Lab Results  Component Value Date   VITAMINB12 834 04/12/2022   Lab Results  Component Value Date   TSH 0.928 04/12/2022        No data to display               No data to display           ASSESSMENT AND PLAN  57 y.o. year old female  has a past medical history of Abnormal Pap smear (02/2003), Anemia, Endometriosis (02/2004), Fibroids, submucosal (02/2004), H/O: menorrhagia (02/2004), Hypertension, Hypothyroidism, Increased BMI (10/11/10), Ovarian cyst, right (03/19/2004), and Simple endometrial hyperplasia (02/2004).: She was referred to [place] for IDIOPATHIC INTRACRANIAL HYPERTENSION due to  consistent findings on MRI and MRV/CTV leading partially empty sella, expanded optic nerve sheaths, stenosis of cerebral sinus veins that can be seen in the setting of idiopathic intracranial hypertension, unfortunately when we saw her she was already placed on Diamox making a workup very difficult  opening pressure was normal at 10 (unfortunately she was already placed on diamox before we even saw her and the opening pressure could have been inacurate), and also ophthalmology exam was normal (she was already on Diamox) but also after being on diamox. Her headache is across the temples and if she moves her head she can feel a pain and fluid and her ears feel full and she has congestion may have a sinus infection. No migrainous feautures. Last time she took the diamox was weeks ago. She was feeling beter until covid and she has started feeling bad again with pressure since covid. Lots of pressure in the head.   Headaches LP while off of the diamox - she had a bad experience at [facility] with last LP I reassured her we can ask for a different radiologist In the meantime will treat you with migraine medication to see if that helps - qulipta samples STOP DIAMOX - Please wait 2 weeks to one month to schedule LP while trying to get her off of diamox to get an acurate opening pressure.  Cervical Stenosis: May 07 2022: MRI cervical spine: See on [place], may need surgery she has several levels that appear to have moderate to severe stenosis(I don;t agree with report, appears worse by my review) will send to Teresa. Reatha Armour for evaluation:   IMPRESSION: 1. 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 Mild-to-moderate bilateral foraminal stenosis C4-C5. A CT of the cervical spine may be useful to assess for OPLL, if clinically warranted. 2. Enlarged and partially empty  sella, which is often a normal anatomic variant but can be associated with idiopathic intracranial hypertension (pseudotumor cerebri).   Electronically Signed By: Teresa Kent  M.D. On: 05/09/2022 12:03   Orders Placed This Encounter  Procedures   DG FLUORO GUIDED LOC OF NEEDLE/CATH TIP FOR SPINAL INJECT LT    Standing Status:   Future    Standing Expiration Date:   06/08/2023    Scheduling Instructions:     She had a bad experience with her last LP at [facility], can we have someone different do it like sjogry or mattern? Please wait 2 weeks to one month to schedule trying to get her off of diamox to get an acurate opening pressure.    Order Specific Question:   Lab orders requested (DO NOT place separate lab orders, these will be automatically ordered during procedure specimen collection):    Answer:   CSF cell count w differential    Order Specific Question:   Lab orders requested (DO NOT place separate lab orders, these will be automatically ordered during procedure specimen collection):    Answer:   Protein and glucose, CSF    Order Specific Question:   Reason for Exam (SYMPTOM  OR DIAGNOSIS REQUIRED)    Answer:   Opening pressure. remove enough fluid to normalize closing pressure. She had a bad experience with her last LP at [facility], can we have someone different do it like sjogry or mattern?    Order Specific Question:   Is the patient pregnant?    Answer:   No    Order Specific Question:   Preferred Imaging Location?    Answer:   [id number]   Ambulatory referral to Neurosurgery    Referral Priority:   Routine    Referral Type:   Surgical    Referral Reason:   Specialty Services Required    Referred to Provider:   Dawley, Theodoro Doing, DO    Requested Specialty:   Neurosurgery    Number of Visits Requested:   1     Meds ordered this encounter  Medications   Atogepant (QULIPTA) 60 MG TABS    Sig: Take 60 mg by mouth at bedtime.    Dispense:  28 tablet    Refill:  0     [id number] 9-25    I spent over 45 minutes of face-to-face and non-face-to-face time with patient on the  1. Chronic intractable headache, unspecified headache type   2. Cervical stenosis of spinal canal    diagnosis.  This included previsit chart review, lab review, study review, order entry, electronic health record documentation, patient education on the different diagnostic and therapeutic options, counseling and coordination of care, risks and benefits of management, compliance, or risk factor reduction  Sarina Ill, [facility] [address] [place], [place] [address] [phone]

## 2022-06-07 NOTE — Patient Instructions (Addendum)
[facility] Dr. Reatha Armour for mri cervical spine and stenosis and pain and then please send to Dr. Emi Holes possibly for injections LP while off of the diamox - Sjogry In the meantime I can treat you with migraine medication to see if that helps STOP DIAMOX  Atogepant Tablets What is this medication? ATOGEPANT (a TOE je pant) prevents migraines. It works by blocking a substance in the body that causes migraines. This medicine may be used for other purposes; ask your health care provider or pharmacist if you have questions. COMMON BRAND NAME(S): QULIPTA What should I tell my care team before I take this medication? They need to know if you have any of these conditions: Kidney disease Liver disease An unusual or allergic reaction to atogepant, other medications, foods, dyes, or preservatives Pregnant or trying to get pregnant Breast-feeding How should I use this medication? Take this medication by mouth with water. Take it as directed on the prescription label at the same time every day. You can take it with or without food. If it upsets your stomach, take it with food. Keep taking it unless your care team tells you to stop. Talk to your care team about the use of this medication in children. Special care may be needed. Overdosage: If you think you have taken too much of this medicine contact a poison control center or emergency room at once. NOTE: This medicine is only for you. Do not share this medicine with others. What if I miss a dose? If you miss a dose, take it as soon as you can. If it is almost time for your next dose, take only that dose. Do not take double or extra doses. What may interact with this medication? Carbamazepine Certain medications for fungal infections, such as itraconazole, ketoconazole Clarithromycin Cyclosporine Efavirenz Etravirine Phenytoin Rifampin St. John's wort This list may not describe all possible interactions. Give your health care provider a  list of all the medicines, herbs, non-prescription drugs, or dietary supplements you use. Also tell them if you smoke, drink alcohol, or use illegal drugs. Some items may interact with your medicine. What should I watch for while using this medication? Visit your care team for regular checks on your progress. Tell your care team if your symptoms do not start to get better or if they get worse. What side effects may I notice from receiving this medication? Side effects that you should report to your care team as soon as possible: Allergic reactions--skin rash, itching, hives, swelling of the face, lips, tongue, or throat Side effects that usually do not require medical attention (report to your care team if they continue or are bothersome): Constipation Fatigue Loss of appetite with weight loss Nausea This list may not describe all possible side effects. Call your doctor for medical advice about side effects. You may report side effects to FDA at 1-800-FDA-1088. Where should I keep my medication? Keep out of the reach of children and pets. Store at room temperature between 20 and 25 degrees C (68 and 77 degrees F). Get rid of any unused medication after the expiration date. To get rid of medications that are no longer needed or have expired: Take the medication to a medication take-back program. Check with your pharmacy or law enforcement to find a location. If you cannot return the medication, check the label or package insert to see if the medication should be thrown out in the garbage or flushed down the toilet. If you are not sure, ask your  care team. If it is safe to put it in the trash, take the medication out of the container. Mix the medication with cat litter, dirt, coffee grounds, or other unwanted substance. Seal the mixture in a bag or container. Put it in the trash. NOTE: This sheet is a summary. It may not cover all possible information. If you have questions about this medicine, talk  to your doctor, pharmacist, or health care provider.  2023 Elsevier/Gold Standard (2020-02-24 00:00:00)

## 2022-06-08 ENCOUNTER — Telehealth: Payer: Self-pay | Admitting: Neurology

## 2022-06-08 NOTE — Telephone Encounter (Signed)
Referral sent to Dr. Reatha Armour at [facility], phone # [phone].

## 2022-06-09 ENCOUNTER — Ambulatory Visit: Payer: BC Managed Care – PPO | Admitting: Family Medicine

## 2022-06-09 ENCOUNTER — Encounter: Payer: Self-pay | Admitting: Neurology

## 2022-06-10 LAB — HEPATIC FUNCTION PANEL
ALT: 18 U/L (ref 7–35)
AST: 17 (ref 13–35)
Alkaline Phosphatase: 56 (ref 25–125)
Bilirubin, Total: 0.9

## 2022-06-10 LAB — TSH: TSH: 2.63 (ref 0.41–5.90)

## 2022-06-10 LAB — BASIC METABOLIC PANEL
BUN: 6 (ref 4–21)
CO2: 32 — AB (ref 13–22)
Chloride: 104 (ref 99–108)
Creatinine: 0.7 (ref 0.5–1.1)
Glucose: 83
Potassium: 3.9 mEq/L (ref 3.5–5.1)
Sodium: 142 (ref 137–147)

## 2022-06-10 LAB — LIPID PANEL
Cholesterol: 221 — AB (ref 0–200)
HDL: 46 (ref 35–70)
LDL Cholesterol: 157
Triglycerides: 103 (ref 40–160)

## 2022-06-10 LAB — COMPREHENSIVE METABOLIC PANEL
Albumin: 3.8 (ref 3.5–5.0)
Calcium: 9.5 (ref 8.7–10.7)

## 2022-06-13 NOTE — Telephone Encounter (Signed)
I checked LP order. Cathy @ [facility] had called pt on 06/19/22 and LVM regarding scheduling appt.

## 2022-06-16 NOTE — Telephone Encounter (Signed)
Scheduled for 06/27/22 at 9am with Dr. Reatha Armour

## 2022-06-22 ENCOUNTER — Inpatient Hospital Stay: Admission: RE | Admit: 2022-06-22 | Payer: Medicaid Other | Source: Ambulatory Visit

## 2022-06-22 ENCOUNTER — Encounter: Payer: Self-pay | Admitting: Cardiology

## 2022-06-23 ENCOUNTER — Encounter: Payer: Self-pay | Admitting: Neurology

## 2022-06-28 ENCOUNTER — Ambulatory Visit
Admission: RE | Admit: 2022-06-28 | Discharge: 2022-06-28 | Disposition: A | Discharge status: Home / Self Care | Payer: Medicaid Other | Source: Ambulatory Visit | Attending: Neurology | Admitting: Neurology

## 2022-06-28 ENCOUNTER — Telehealth: Payer: Self-pay | Admitting: Neurology

## 2022-06-28 VITALS — BP 123/62 | HR 70

## 2022-06-28 DIAGNOSIS — G932 Benign intracranial hypertension: Secondary | ICD-10-CM

## 2022-06-28 DIAGNOSIS — G8929 Other chronic pain: Secondary | ICD-10-CM

## 2022-06-28 LAB — CSF CELL COUNT WITH DIFFERENTIAL
RBC Count, CSF: 0 cells/uL
TOTAL NUCLEATED CELL: 0 cells/uL (ref 0–5)

## 2022-06-28 LAB — PROTEIN, CSF: Total Protein, CSF: 42 mg/dL (ref 15–45)

## 2022-06-28 LAB — GLUCOSE, CSF: Glucose, CSF: 56 mg/dL (ref 40–80)

## 2022-06-28 NOTE — Discharge Instructions (Signed)

## 2022-06-28 NOTE — Telephone Encounter (Signed)
I have a telephone appointment with patient but insurance is no longer coverning can you call patient and change her to a videp or in office please? thanks

## 2022-06-29 NOTE — Telephone Encounter (Signed)
Pt's appointment changed from telephone to video visit

## 2022-07-04 ENCOUNTER — Encounter: Payer: Self-pay | Admitting: Neurology

## 2022-07-05 ENCOUNTER — Other Ambulatory Visit: Payer: Self-pay | Admitting: Gastroenterology

## 2022-07-05 ENCOUNTER — Ambulatory Visit: Payer: Medicaid Other | Admitting: Neurology

## 2022-07-05 ENCOUNTER — Encounter: Payer: Self-pay | Admitting: Neurology

## 2022-07-05 ENCOUNTER — Telehealth: Payer: Self-pay | Admitting: Neurology

## 2022-07-05 VITALS — BP 131/76 | HR 65 | Ht 71.0 in | Wt 214.0 lb

## 2022-07-05 DIAGNOSIS — G4486 Cervicogenic headache: Secondary | ICD-10-CM | POA: Diagnosis not present

## 2022-07-05 DIAGNOSIS — M47812 Spondylosis without myelopathy or radiculopathy, cervical region: Secondary | ICD-10-CM | POA: Diagnosis not present

## 2022-07-05 DIAGNOSIS — G8929 Other chronic pain: Secondary | ICD-10-CM | POA: Diagnosis not present

## 2022-07-05 DIAGNOSIS — R1310 Dysphagia, unspecified: Secondary | ICD-10-CM

## 2022-07-05 DIAGNOSIS — G932 Benign intracranial hypertension: Secondary | ICD-10-CM

## 2022-07-05 MED ORDER — CYCLOBENZAPRINE HCL 10 MG PO TABS: 10.0000 mg | ORAL_TABLET | Freq: Every day | ORAL | 3 refills | Status: DC

## 2022-07-05 NOTE — Telephone Encounter (Signed)
Referral sent to Dr. Marcelline Mates at [facility] neuro, phone # [phone].

## 2022-07-05 NOTE — Progress Notes (Signed)
Chief Complaint  Patient presents with   Follow-up    Rm 4, James.  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    HISTORY OF PRESENT ILLNESS:  07/05/2022: She was referred to [place] for IDIOPATHIC INTRACRANIAL HYPERTENSION due to consistent findings on MRI and MRV/CTV leading partially empty sella, expanded optic nerve sheaths, stenosis of cerebral sinus veins that can be seen in the setting of idiopathic intracranial hypertension, unfortunately when we saw her she was already placed on Diamox making a workup very difficult  opening pressure was normal at 10 (unfortunately she was already placed on diamox before we even saw her and the opening pressure could have been inacurate), and also ophthalmology exam was normal (she was already on Diamox)  Headaches: We took her off of diamox([facility]ion [facility] who diagnosed instead with cervicogenic headache) and her opening pressure was 19 normal(but MRI/MRV all really consistent with IDIOPATHIC INTRACRANIAL HYPERTENSION). She saw rheumatology who thinks she has GCA and has her on prednisone. Crp was 12.5, sed rate was 44. She is scheduled for temporal artery biopsy. She is on steroids 64m and it has helped some with the headaches around the temples but still having neck pain.   Referral for biopsy 26th of this month for the biopsy for possible gca 2. She saw Dr DReatha Armourfor her cervical spinal stenosis and is in PT and dry needling - starts this FJanie Morningmay be coming from neck, has significant dervical degenerative disease and moderate ot severe stenosis 3. Saw rheumatology since I last saw her and esr and crp were slightly elevated. They recommended steroids and biopsy.  4. Saw ENT to ensure headaches are not sinus related - sinuses were clear 5. 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 KBryson Corona who is an expert in venous stenosis and IDIOPATHIC INTRACRANIAL HYPERTENSION  but also getting a new CTV (CTV in 03/2022 showed: IMPRESSION: 2. Possible filling defect versus arachnoid granulation in the proximal left sigmoid sinus, with the left sigmoid sinus otherwise opacified. If there is persistent clinical concern for venous sinus thrombosis, consider MRV with contrast.3. Narrowing of the bilateral sigmoid sinuses at the transverse sigmoid junction, partial empty sella with sellar expansion, and optic nerve tortuosity, which together concerning for idiopathic intracranial hypertension. Correlate with symptoms and consider lumbar puncture with opening pressure.) 6. [facility] no papilledema even when off of the diamox  Patient complains of symptoms per HPI as well as the following symptoms: neck pain . Pertinent negatives and positives per HPI. All others negative   06/07/2022: [place] due to consistent findings on MRI and MRV/CTV leading partially empty sella, expanded optic nerve sheaths, stenosis of cerebral sinus veins that can be seen in the setting of idiopathic intracranial hypertension, unfortunately when we saw her she was already placed on Diamox making a workup very difficult  opening pressure was normal at 10 (unfortunately she was already placed on diamox before we even saw her and the opening pressure could have been inacurate), and also ophthalmology exam was normal (she was already on Diamox) but also after being on diamox. Her headache is across the temples and if she moves her head she can feel a pain and fluid and her ears feel full and she has congestion may have a sinus infection. No migrainous feautures. Last  time she took the diamox was weeks ago. She was feeling beter until covid and she has started feeling bad again with pressure since covid. Lots of pressure in the head.    May 07 2022: MRI  cervical spine  EXAM: MRI CERVICAL SPINE WITHOUT CONTRAST  TECHNIQUE: Multiplanar, multisequence MR imaging of the cervical spine was performed. No intravenous contrast was administered.  COMPARISON: None Available.  FINDINGS: Alignment: Straightening. No substantial sagittal subluxation.  Vertebrae: No fracture, evidence of discitis, or bone lesion.  Cord: Normal cord signal.  Posterior Fossa, vertebral arteries, paraspinal tissues: Visualized vertebral artery flow voids maintained. Unremarkable visualized posterior fossa. Enlarged and partially empty sella.  Disc levels:  Question ossification of posterior longitudinal ligament, particularly lower cervical spine.  C2-C3: Small posterior disc osteophyte complex without significant stenosis.  C3-C4: Posterior disc osteophyte complex with bilateral facet and uncovertebral hypertrophy. No significant canal or foraminal stenosis.  C4-C5: Posterior disc osteophyte complex with facet and uncovertebral hypertrophy. Resulting mild-to-moderate canal on mild-to-moderate bilateral foraminal stenosis.  C5-C6: Posterior disc osteophyte complex with bilateral uncovertebral hypertrophy. Mild canal stenosis. Patent foramina.  C6-C7: Posterior disc osteophyte complex with bilateral uncovertebral hypertrophy. Mild bilateral foraminal stenosis without significant stenosis.  C7-T1: Left posterior disc osteophyte complex and uncovertebral hypertrophy. Patent canal and foramina.  IMPRESSION: 1. 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 Mild-to-moderate bilateral foraminal stenosis C4-C5. A CT of the cervical spine may be useful to assess for OPLL, if clinically warranted. 2. Enlarged and partially empty sella, which is often a normal anatomic variant but can be associated with  idiopathic intracranial hypertension (pseudotumor cerebri).   Electronically Signed By: Margaretha Sheffield M.D. On: 05/09/2022 12:03   07/05/22 ALL:  Teresa Kent is a 57 y.o. female here today for follow up for IIH. She was seen in consult with Dr Jaynee Eagles 03/2022 for IIH following ER visit 04/09/2022 where MRI showed empty sella and prominence of CSF ventral to pons and along optic nerves. CTA/CTV showed concerns for IIH. 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 No thrombosis. Ophthalmology note 05/03/2022 did not show any concerns of optic edema. LP 04/25/2022 showed normal opening pressure. She was experiencing head squeezing on Diamox and after low potassium was switched to topiramate. She reports not being comfortable with side effects of topiramate and has continued acetazolamide 157m QD-BID. She continues to feel swimmy headed and reports inability to lie flat. She has intermittent left eye twitching. She denies headaches. No vision changes or concerns of vision loss. [facility]    HISTORY (copied from Dr ACathren Laineprevious note)  HPI:  Teresa GANIMis a 57y.o. female here as requested by BElgie Congo MD for IDIOPATHIC INTRACRANIAL HYPERTENSION.  Started 2 months agian, strange pressure in her neck, waxes and wanes, has headaches left side of the head, in the ear, full ear like an ear plug, blurry vision , pressure in the head, no inciding events, no numbness, ni weakness, headaches are tolerable 2-3/10, she has an upcoming vision exam who is an ophthalmologist, no vision loss episodic blurry vision, better if she leans to one side positional, more pressure. No other focal neurologic deficits, associated symptoms, inciting events or modifiable factors.the diamox has helped. Also going to rheumatology for her elevated ESR/CRP and is on steroids.  No other focal neurologic  deficits, associated symptoms, inciting events or modifiable factors.    REVIEW OF SYSTEMS: Out of a complete 14 system review of symptoms, the patient complains only of the following symptoms, head pressure, eye twitching, and all other reviewed systems are negative.   ALLERGIES: Allergies  Allergen Reactions   Prilosec [Omeprazole]    Simvastatin      HOME MEDICATIONS: Outpatient Medications Prior to Visit  Medication Sig Dispense Refill   Atogepant (QULIPTA) 60 MG TABS Take 60 mg by mouth at bedtime. 28 tablet 0   famotidine (PEPCID) 20 MG tablet Take 20 mg by mouth daily.     levothyroxine (SYNTHROID) 150 MCG tablet Take 150 mcg by mouth daily before breakfast.     Multiple Vitamin (MULTIVITAMIN) tablet Take 1 tablet by mouth 2 (two) times daily.       ondansetron (ZOFRAN-ODT) 8 MG disintegrating tablet Take 1 tablet (8 mg total) by mouth every 8 (eight) hours as needed for nausea or vomiting. 20 tablet 0   predniSONE (DELTASONE) 20 MG tablet Take 20 mg by mouth daily with breakfast.     ZETIA 10 MG tablet Take 10 mg by mouth daily.     potassium chloride (KLOR-CON) 10 MEQ tablet Take 10 mEq by mouth 2 (two) times daily. (Patient not taking: Reported on 07/05/2022)     amoxicillin-clavulanate (AUGMENTIN) 875-125 MG tablet Take 1 tablet by mouth 2 (two) times daily.     No facility-administered medications prior to visit.     PAST MEDICAL HISTORY: Past Medical History:  Diagnosis Date   Abnormal Pap smear 02/2003   Anemia    heavy menses   Endometriosis 02/2004   Fibroids, submucosal 02/2004   H/O: menorrhagia 02/2004   Hypertension    Hypothyroidism    Increased BMI 10/11/10   Ovarian cyst, right 03/19/2004   Simple endometrial hyperplasia 02/2004     PAST SURGICAL HISTORY: Past Surgical History:  Procedure Laterality Date   DILATION AND CURETTAGE OF UTERUS  03/19/2004   DILITATION & CURRETTAGE/HYSTROSCOPY WITH NOVASURE ABLATION N/A 12/20/2012   Procedure:  DILATATION & CURETTAGE/HYSTEROSCOPY WITH NOVASURE ABLATION;  Surgeon: Delice Lesch, MD;  Location: [facility];  Service: Gynecology;  Laterality: N/A;   OVARY SURGERY     resection of fibroid  03/19/2004   THYROID SURGERY     TUBAL LIGATION       FAMILY HISTORY: Family History  Problem Relation Age of Onset   Hypotension Mother    Breast cancer Maternal Grandmother    Diabetes Maternal Grandmother      SOCIAL HISTORY: Social History   Socioeconomic History   Marital status: Single    Spouse name: Not on file   Number of children: Not on file   Years of education: Not on file   Highest education level: Not on file  Occupational History   Not on file  Tobacco Use   Smoking status: Never   Smokeless tobacco: Not on file  Substance and Sexual Activity   Alcohol use: No   Drug use: No   Sexual activity: Never    Birth control/protection: Surgical  Other Topics Concern   Not on file  Social History Narrative   Right Handed   No Caffeine use    Social Determinants of Health   Financial Resource Strain: Not on file  Food Insecurity: Not on file  Transportation Needs: Not on file  Physical Activity: Not on file  Stress: Not on file  Social  Connections: Not on file  Intimate Partner Violence: Not on file     PHYSICAL EXAM  Vitals:   07/05/22 1302  BP: 131/76  Pulse: 65  Weight: 214 lb (97.1 kg)  Height: 5' 11"$  (1.803 m)   Body mass index is 29.85 kg/m. Physical exam: Exam: Gen: NAD, conversant, well nourised, well groomed                     CV: RRR, no MRG. No Carotid Bruits. No peripheral edema, warm, nontender Eyes: Conjunctivae clear without exudates or hemorrhage  Neuro: Detailed Neurologic Exam  Speech:    Speech is normal; fluent and spontaneous with normal comprehension.  Cognition:    The patient is oriented to person, place, and time;     recent and remote memory intact;     language fluent;     normal attention, concentration,     fund  of knowledge Cranial Nerves:    The pupils are equal, round, and reactive to light. The fundi are normal and spontaneous venous pulsations are present. Visual fields are full to finger confrontation. Extraocular movements are intact. Trigeminal sensation is intact and the muscles of mastication are normal. The face is symmetric. The palate elevates in the midline. Hearing intact. Voice is normal. Shoulder shrug is normal. The tongue has normal motion without fasciculations.   Coordination:    Normal finger to nose and heel to shin. Normal rapid alternating movements.   Gait:    Heel-toe and tandem gait are normal.   Motor Observation:    No asymmetry, no atrophy, and no involuntary movements noted. Tone:    Normal muscle tone.    Posture:    Posture is normal. normal erect    Strength:    Strength is V/V in the upper and lower limbs.      Sensation: intact to LT     Reflex Exam:  DTR's:    Deep tendon reflexes in the upper and lower extremities are normal bilaterally.   Toes:    The toes are downgoing bilaterally.   Clonus:    Clonus is absent.   DIAGNOSTIC DATA (LABS, IMAGING, TESTING) - I reviewed patient records, labs, notes, testing and imaging myself where available.  Lab Results  Component Value Date   WBC 5.4 05/16/2022   HGB 14.0 05/16/2022   HCT 43.3 05/16/2022   MCV 90.4 05/16/2022   PLT 330 05/16/2022      Component Value Date/Time   NA 141 05/16/2022 1208   NA 134 05/01/2022 1112   K 3.5 05/16/2022 1208   CL 108 05/16/2022 1208   CO2 21 (L) 05/16/2022 1208   GLUCOSE 64 (L) 05/16/2022 1208   BUN 7 05/16/2022 1208   BUN 7 05/01/2022 1112   CREATININE 0.76 05/16/2022 1208   CALCIUM 9.5 05/16/2022 1208   GFRNONAA >60 05/16/2022 1208   GFRAA >90 12/18/2012 1100   No results found for: "CHOL", "HDL", "LDLCALC", "LDLDIRECT", "TRIG", "CHOLHDL" No results found for: "HGBA1C" Lab Results  Component Value Date   [id number] 04/12/2022   Lab Results   Component Value Date   TSH 0.928 04/12/2022        No data to display               No data to display           ASSESSMENT AND PLAN  57 y.o. year old female  has a past medical history of Abnormal Pap smear (  02/2003), Anemia, Endometriosis (02/2004), Fibroids, submucosal (02/2004), H/O: menorrhagia (02/2004), Hypertension, Hypothyroidism, Increased BMI (10/11/10), Ovarian cyst, right (03/19/2004), and Simple endometrial hyperplasia (02/2004).: She was referred to [place] for IDIOPATHIC INTRACRANIAL HYPERTENSION due to consistent findings on MRI and MRV/CTV leading partially empty sella, expanded optic nerve sheaths, stenosis of cerebral sinus veins that can be seen in the setting of idiopathic intracranial hypertension, unfortunately when we saw her she was already placed on Diamox making a workup very difficult  opening pressure was normal at 10 (unfortunately she was already placed on diamox before we even saw her and the opening pressure could have been inacurate), and also ophthalmology exam was normal (she was already on Diamox) but also after being on diamox. Her headache is across the temples and if she moves her head she can feel a pain and fluid and her ears feel full and she has congestion may have a sinus infection. No migrainous feautures. Last time she took the diamox was weeks ago. She was feeling beter until covid and she has started feeling bad again with pressure since covid. Lots of pressure in the head.   Headaches: We took her off of diamox(suggestion [facility] who diagnosed instead with cervicogenic headache) and her opening pressure was 19 normal(but MRI/MRV all really consistent with IDIOPATHIC INTRACRANIAL HYPERTENSION). She saw rheumatology who thinks she has GCA and has her on prednisone. Crp was 12.5, sed rate was 44. She is scheduled for temporal artery biopsy. She is on steroids 73m and it has helped some with the headaches around the temples but still  having neck pain. At this point the headaches sound more cervico genic but getting a very thorough evaluation.   Referral for biopsy 26th of this month for the biopsy for possible gca 2. She saw Dr DReatha Armourfor her cervical spinal stenosis and is in PT and dry needling - starts this FJanie Morningmay be coming from neck, has significant dervical degenerative disease and moderate ot severe stenosis 3. Saw rheumatology since I last saw her and esr and crp were slightly elevated. They recommended steroids and biopsy.  4. Saw ENT to ensure headaches are not sinus related - sinuses were clear 5. 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 KBryson Coronawho is an expert in venous stenosis and IDIOPATHIC INTRACRANIAL HYPERTENSION  but also getting a new CTV (CTV in 03/2022 showed: IMPRESSION: 2. Possible filling defect versus arachnoid granulation in the proximal left sigmoid sinus, with the left sigmoid sinus otherwise opacified. If there is persistent clinical concern for venous sinus thrombosis, consider MRV with contrast.3. Narrowing of the bilateral sigmoid sinuses at the transverse sigmoid junction, partial empty sella with sellar expansion, and optic nerve tortuosity, which together concerning for idiopathic intracranial hypertension. Correlate with symptoms and consider lumbar puncture with opening pressure.) 6. Saw ophtho 06/02/2022 at [facility], no papilledema even when off of the diamox 7. Flexeril at bedtime  Cervical Stenosis: May 07 2022: MRI cervical spine: See on [facility], may need surgery she has several levels that appear to have moderate to severe stenosis(I don;t agree with report, appears worse by my review) will send to Dr. DReatha Armourfor evaluation:   IMPRESSION: 1. 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 Mild-to-moderate bilateral foraminal stenosis C4-C5.  A CT of the cervical spine may be useful to assess for OPLL, if clinically warranted. 2. Enlarged and partially empty sella, which is often a normal anatomic variant but can be associated with idiopathic intracranial hypertension (pseudotumor cerebri).   Electronically Signed By: Margaretha Sheffield M.D. On: 05/09/2022 12:03   Orders Placed This Encounter  Procedures   Ambulatory referral to Neurosurgery    Referral Priority:   Routine    Referral Type:   Surgical    Referral Reason:   Specialty Services Required    Referred to Provider:   Drexel Iha, MD    Requested Specialty:   Neurosurgery    Number of Visits Requested:   1     Meds ordered this encounter  Medications   cyclobenzaprine (FLEXERIL) 10 MG tablet    Sig: Take 1 tablet (10 mg total) by mouth at bedtime.    Dispense:  90 tablet    Refill:  3    I spent over 45 minutes of face-to-face and non-face-to-face time with patient on the  1. IIH (idiopathic intracranial hypertension)   2. Chronic intractable headache, unspecified headache type   3. Osteoarthritis of cervical spine, unspecified spinal osteoarthritis complication status   4. Cervicogenic headache     diagnosis.  This included previsit chart review, lab review, study review, order entry, electronic health record documentation, patient education on the different diagnostic and therapeutic options, counseling and coordination of care, risks and benefits of management, compliance, or risk factor reduction  Sarina Ill, MD [facility] [address], [place], [place] [address] [phone]

## 2022-07-05 NOTE — Patient Instructions (Addendum)
Referral for biopsy 26th of this month for the biopsy for possible gca 2. She saw Dr Reatha Armour for her cervical spinal stenosis and is in PT and dry needling - starts this Janie Morning may be coming from neck, has significant dervical degenerative disease and moderate ot severe stenosis 3. Saw rheumatology since I last saw her and esr and crp were slightly elevated. They recommended steroids and biopsy.  4. Saw ENT to ensure headaches are not sinus related - sinuses were clear 5. 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 Bryson Corona who is an expert in venous stenosis and IDIOPATHIC INTRACRANIAL HYPERTENSION  but also getting a new CTV (CTV in 03/2022 showed: IMPRESSION: 2. Possible filling defect versus arachnoid granulation in the proximal left sigmoid sinus, with the left sigmoid sinus otherwise opacified. If there is persistent clinical concern for venous sinus thrombosis, consider MRV with contrast.3. Narrowing of the bilateral sigmoid sinuses at the transverse sigmoid junction, partial empty sella with sellar expansion, and optic nerve tortuosity, which together concerning for idiopathic intracranial hypertension. Correlate with symptoms and consider lumbar puncture with opening pressure.) 6. Saw ophtho 06/02/2022 at [facility], no papilledema even when off of the diamox  Cyclobenzaprine Tablets What is this medication? CYCLOBENZAPRINE (sye kloe BEN za preen) treats muscle spasms. It works by relaxing your muscles, which reduces muscle stiffness. It belongs to a group of medications called muscle relaxants. This medicine may be used for other purposes; ask your health care provider or pharmacist if you have questions. COMMON BRAND NAME(S): Fexmid, Flexeril What should I tell my care team before I take this medication? They need to know if you have any of these  conditions: Heart disease, irregular heartbeat, or previous heart attack Liver disease Thyroid problem An unusual or allergic reaction to cyclobenzaprine, tricyclic antidepressants, lactose, other medications, foods, dyes, or preservatives Pregnant or trying to get pregnant Breastfeeding How should I use this medication? Take this medication by mouth with a glass of water. Take it as directed on the prescription label. You can take it with or without food. If it upsets your stomach, take it with food. Do not take it more often than directed. Talk to your care team about the use of this medication in children. Special care may be needed. Overdosage: If you think you have taken too much of this medicine contact a poison control center or emergency room at once. NOTE: This medicine is only for you. Do not share this medicine with others. What if I miss a dose? If you miss a dose, take it as soon as you can. If it is almost time for your next dose, take only that dose. Do not take double or extra doses. What may interact with this medication? Do not take this medication with any of the following: MAOIs, such as Carbex, Eldepryl, Marplan, Nardil, and Parnate Opioid medications for cough Safinamide This medication may also interact with the following: Alcohol Bupropion Antihistamines for allergy, cough, and cold Certain medications for anxiety or sleep Certain medications for bladder problems, such as oxybutynin, tolterodine Certain medications for depression, such as amitriptyline, fluoxetine, sertraline Certain medications for Parkinson disease, such as benztropine, trihexyphenidyl Certain medications for seizures, such as phenobarbital, primidone Certain medications for stomach problems, such as dicyclomine, hyoscyamine Certain medications for travel sickness, such as scopolamine General anesthetics, such as halothane, isoflurane, methoxyflurane, propofol Ipratropium Local anesthetics,  such as  lidocaine, pramoxine, tetracaine Medications that relax muscles for surgery Opioid medications for pain Phenothiazines, such as chlorpromazine, mesoridazine, prochlorperazine, thioridazine Verapamil This list may not describe all possible interactions. Give your health care provider a list of all the medicines, herbs, non-prescription drugs, or dietary supplements you use. Also tell them if you smoke, drink alcohol, or use illegal drugs. Some items may interact with your medicine. What should I watch for while using this medication? Visit your care team for regular checks on your progress. Tell your care team if your symptoms do not start to get better or if they get worse. This medication may affect your coordination, reaction time, or judgment. Do not drive or operate machinery until you know how this medication affects you. Sit up or stand slowly to reduce the risk of dizzy or fainting spells. Drinking alcohol with this medication can increase the risk of these side effects. Taking this medication with other substances that cause drowsiness, such as alcohol, benzodiazepines, or other opioids can cause serious side effects. Give your care team a list of all medications you use. They will tell you how much medication to take. Do not take more medication than directed. Call emergency services if you have problems breathing or staying awake. Your mouth may get dry. Chewing sugarless gum or sucking hard candy and drinking plenty of water may help. Contact your care team if the problem does not go away or is severe. What side effects may I notice from receiving this medication? Side effects that you should report to your care team as soon as possible: Allergic reactions--skin rash, itching, hives, swelling of the face, lips, tongue, or throat CNS depression--slow or shallow breathing, shortness of breath, feeling faint, dizziness, confusion, trouble staying awake Heart rhythm changes--fast or  irregular heartbeat, dizziness, feeling faint or lightheaded, chest pain, trouble breathing Side effects that usually do not require medical attention (report to your care team if they continue or are bothersome): Constipation Dizziness Drowsiness Dry mouth Fatigue Nausea This list may not describe all possible side effects. Call your doctor for medical advice about side effects. You may report side effects to FDA at 1-800-FDA-1088. Where should I keep my medication? Keep out of the reach of children and pets. Store at room temperature between 20 and 25 degrees C (68 and 77 degrees F). Keep container tightly closed. Get rid of any unused medication after the expiration date. To get rid of medications that are no longer needed or have expired: Take the medication to a medication take-back program. Check with your pharmacy or law enforcement to find a location. If you cannot return the medication, check the label or package insert to see if the medication should be thrown out in the garbage or flushed down the toilet. If you are not sure, ask your care team. If it is safe to put it in the trash, empty the medication out of the container. Mix the medication with cat litter, dirt, coffee grounds, or other unwanted substance. Seal the mixture in a bag or container. Put it in the trash. NOTE: This sheet is a summary. It may not cover all possible information. If you have questions about this medicine, talk to your doctor, pharmacist, or health care provider.  2023 Elsevier/Gold Standard (2021-11-11 00:00:00)

## 2022-07-06 NOTE — Therapy (Signed)
OUTPATIENT PHYSICAL THERAPY CERVICAL EVALUATION   Patient Name: Teresa Kent MRN: [id number] DOB:04/09/1966, 57 y.o., female Today's Date: 07/08/2022  END OF SESSION:  PT End of Session - 07/08/22 1042     Visit Number 1    Date for PT Re-Evaluation 09/30/22    PT Start Time 1012    PT Stop Time 1055    PT Time Calculation (min) 43 min    Activity Tolerance Patient limited by pain    Behavior During Therapy [facility] for tasks assessed/performed             Past Medical History:  Diagnosis Date   Abnormal Pap smear 02/2003   Anemia    heavy menses   Endometriosis 02/2004   Fibroids, submucosal 02/2004   H/O: menorrhagia 02/2004   Hypertension    Hypothyroidism    Increased BMI 10/11/10   Ovarian cyst, right 03/19/2004   Simple endometrial hyperplasia 02/2004   Past Surgical History:  Procedure Laterality Date   DILATION AND CURETTAGE OF UTERUS  03/19/2004   DILITATION & CURRETTAGE/HYSTROSCOPY WITH NOVASURE ABLATION N/A 12/20/2012   Procedure: DILATATION & CURETTAGE/HYSTEROSCOPY WITH NOVASURE ABLATION;  Surgeon: Delice Lesch, MD;  Location: [facility];  Service: Gynecology;  Laterality: N/A;   OVARY SURGERY     resection of fibroid  03/19/2004   THYROID SURGERY     TUBAL LIGATION     Patient Active Problem List   Diagnosis Date Noted   Osteoarthritis of cervical spine 07/05/2022   Cervicogenic headache 07/05/2022   Chronic intractable headache 06/07/2022   Cervical stenosis of spinal canal 06/07/2022   IIH (idiopathic intracranial hypertension) 04/19/2022   Hidradenitis suppurativa 12/19/2011    PCP: Caren Macadam, MD  REFERRING PROVIDER: Dawley, Theodoro Doing, DO  REFERRING DIAG: M54.2 (ICD-10-CM) - Cervicalgia   THERAPY DIAG:  Cervicalgia  Difficulty in walking, not elsewhere classified  Other lack of coordination  Other muscle spasm  Rationale for Evaluation and Treatment: Rehabilitation  ONSET DATE: 06/27/22  SUBJECTIVE:                                                                                                                                                                                                          SUBJECTIVE STATEMENT: Patient reports pressure in her head since last November. No one can identify the exact cause. MRI shows arthritis in her entire spine. She has experienced some trembling in her back and UE.  PERTINENT HISTORY:  Per referring Physician: Patient reports Idiopathic Intracranial Hypertension and headaches, primarily on her B frontal and  temporal region-pressure. Also C/O Occipital and suboccipital headaches. Treated for pseudotumor cerebral with Diamox, but could not tolerate the medication. Denies blurred vision denies N/V, or balance issues. C/O R sided pulsatile tinnitus since Thyroid surgery. C/O intermittent numbness in R hand and calf, L toes. Occasionally drops things in R hand. 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 No cord compression or signal change  PAIN:  Are you having pain? Yes: NPRS scale: 6-7/10 Pain location: R head and neck, B thoracic spine between shoulder blades, and lower lumbar spine Pain description: shaky, inflamed, pulling, pressure in head, stiffness in shoulders Aggravating factors: Nothing in particular-maybe sitting too long Relieving factors: Prednisone helps some, Tylenol arthritis, heat Lying flat is impossible for sleeping PRECAUTIONS: None  WEIGHT BEARING RESTRICTIONS: No  FALLS:  Has patient fallen in last 6 months? 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  PLOF: Independent and Washing her hair causes severe pain  PATIENT GOALS: Relieve pressure in her head and neck, return to her previous level of activity without pain  NEXT MD VISIT: Not  scheduled.  OBJECTIVE:   DIAGNOSTIC FINDINGS:  MRI 05/07/22 IMPRESSION: 1. 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 Mild-to-moderate bilateral foraminal stenosis C4-C5. A CT of the cervical spine may be useful to assess for OPLL, if clinically warranted. 2. Enlarged and partially empty sella, which is often a normal anatomic variant but can be associated with idiopathic intracranial hypertension (pseudotumor cerebri)  COGNITION: Overall cognitive status: Within functional limits for tasks assessed  SENSATION: WFL  POSTURE: rounded shoulders, forward head, increased lumbar lordosis, and anterior pelvic tilt  PALPATION: Severely tight and TTP in B upper traps, LS, TP in B up traps, L > R. B SCM very tight and TTP as well as scalenes. B cerv paraspinals also tight and TTP, R > L.  CERVICAL ROM:   Active ROM A/PROM (deg) eval  Flexion WNL  Extension WNL  Right lateral flexion 70%, feels tight  Left lateral flexion 75%, feels tight  Right rotation WNL, feels tight  Left rotation WNL, feels tight   (Blank rows = not tested)  UPPER EXTREMITY ROM: WFL, but painful in elevation and reaching to wash her hair.  UPPER EXTREMITY MMT: 5/5   CERVICAL SPECIAL TESTS:  Spurling's test: Positive and Distraction test: Positive  FUNCTIONAL TESTS:  Quick DASH test -26, 34%  TODAY'S TREATMENT:                                                                                                                              DATE:  07/08/22  Education  PATIENT EDUCATION:  Education details: POC Person educated:  Patient Education method: Explanation Education comprehension: verbalized understanding  HOME EXERCISE PROGRAM: TBD  ASSESSMENT:  CLINICAL IMPRESSION: Patient is a 57 y.o. who was seen today for physical therapy  evaluation and treatment for cervicalgia. She reports that spinal X ray shows DDD from top to bottom. She has severe spasm and tightness in B up traps, LS, ant cervical muscles as well as cervical paraspinals are all tight and TTP. Her ROM is pretty close to normal, but painful and functionally her neck and upper body movements are restricted due to pain and guarding. She will benefit from PT address her severe tightness and pain, and to increase her postural strength and stability to better manage her chronic condition.  OBJECTIVE IMPAIRMENTS: decreased activity tolerance, decreased balance, decreased coordination, difficulty walking, and pain.   ACTIVITY LIMITATIONS: carrying, lifting, bending, squatting, and locomotion level  PARTICIPATION LIMITATIONS: cleaning, laundry, shopping, community activity, and occupation  PERSONAL FACTORS: Past/current experiences are also affecting patient's functional outcome.   REHAB POTENTIAL: Good  CLINICAL DECISION MAKING: Evolving/moderate complexity  EVALUATION COMPLEXITY: Moderate   GOALS: Goals reviewed with patient? Yes  SHORT TERM GOALS: Target date: 07/23/22  I with initial HEP Baseline:  Goal status: INITIAL  LONG TERM GOALS: Target date: 09/30/22  I with final HEP Baseline:  Goal status: INITIAL  2.  Patient will recover full painfree ROM in her neck Baseline: Mildly limited, but painful Goal status: INITIAL  3.  Improve Quick Dash score to < 15, < 10% disability Baseline: 26, 34% disability Goal status: INITIAL  4.  Patient will return to sleeping in a horizontal position Baseline: Unable to lie flat. Goal status: INITIAL  5.  Patient will report the ability to perform all her normal daily activities with pain < 3/10 Baseline: 7/10 Goal status: INITIAL  6.  Ambulate at least 20 minutes with pain < 3/10 Baseline: Limited gait due to pain Goal status: INITIAL   PLAN:  PT FREQUENCY: 1-2x/week  PT DURATION: 12  weeks  PLANNED INTERVENTIONS: Therapeutic exercises, Therapeutic activity, Neuromuscular re-education, Balance training, Gait training, Patient/Family education, Self Care, Joint mobilization, Dry Needling, Electrical stimulation, Cryotherapy, Moist heat, Taping, Traction, Ultrasound, Ionotophoresis 26m/ml Dexamethasone, and Manual therapy  PLAN FOR NEXT SESSION: STM, DN? HMiles DPT 07/08/2022, 12:13 PM

## 2022-07-07 LAB — VITAMIN D 25 HYDROXY (VIT D DEFICIENCY, FRACTURES): Vit D, 25-Hydroxy: 34.9

## 2022-07-08 ENCOUNTER — Ambulatory Visit: Payer: Medicaid Other | Attending: Neurological Surgery | Admitting: Physical Therapy

## 2022-07-08 ENCOUNTER — Encounter: Payer: Self-pay | Admitting: Physical Therapy

## 2022-07-08 DIAGNOSIS — M542 Cervicalgia: Secondary | ICD-10-CM | POA: Diagnosis present

## 2022-07-08 DIAGNOSIS — R278 Other lack of coordination: Secondary | ICD-10-CM | POA: Insufficient documentation

## 2022-07-08 DIAGNOSIS — R262 Difficulty in walking, not elsewhere classified: Secondary | ICD-10-CM | POA: Diagnosis present

## 2022-07-08 DIAGNOSIS — M62838 Other muscle spasm: Secondary | ICD-10-CM | POA: Diagnosis present

## 2022-07-10 ENCOUNTER — Encounter: Payer: Self-pay | Admitting: Neurology

## 2022-07-13 ENCOUNTER — Encounter: Payer: Self-pay | Admitting: Pulmonary Disease

## 2022-07-13 ENCOUNTER — Ambulatory Visit (INDEPENDENT_AMBULATORY_CARE_PROVIDER_SITE_OTHER): Payer: Medicaid Other | Admitting: Pulmonary Disease

## 2022-07-13 VITALS — BP 106/68 | HR 67 | Temp 98.0°F | Ht 71.0 in | Wt 215.0 lb

## 2022-07-13 DIAGNOSIS — M316 Other giant cell arteritis: Secondary | ICD-10-CM

## 2022-07-13 DIAGNOSIS — R0609 Other forms of dyspnea: Secondary | ICD-10-CM | POA: Diagnosis not present

## 2022-07-13 NOTE — Patient Instructions (Signed)
Dyspnea after having COVID, recent temporal arteritis treatment: Full lung function test High-resolution CT scan of the chest Ambulatory telemetry testing  Follow-up with [place] in 4 to 6 weeks to go over these results, sooner if needed

## 2022-07-13 NOTE — Progress Notes (Signed)
Synopsis: Referred in February 2024 for dyspnea  Subjective:   PATIENT ID: Teresa Kent GENDER: female DOB: 05/17/1966, MRN: [id number]   HPI  Chief Complaint  Patient presents with   Consult    SOB, pt had covid in January. After covid pt has sob, and became a mouth breather. Pt is on prednisone. 20 mg daily     She had COVID in early January and was treated with paxlovid Now she has dyspnea > has some chest heaviness > she hears crackles and popping when she breathes out of her mouth > no sinus congestion > dyspnea has improved somewhat in the last few weeks > had some chest pain, has ben treated with prednisone several times > this is the first time she's had COVID > she's been vaccinated and has had boosters  Recurrent headaches recently > she's been treated with prednisone for temporal arteritis > told she had increased cerebral pressure  Life long non-smoker Lives in an appartment, been there 5 years, has some mildew Works as caregiver  She's had back pain/spinal problems for years  No family with lung problems  Never had a lung problem Record review: January 2024 virtual visit with primary care reviewed where the patient had tested positive for COVID on January 2, took Paxlovid, called back for ongoing congestion and cough, treated with over-the-counter nasal decongestants, supportive care.  Past Medical History:  Diagnosis Date   Abnormal Pap smear 02/2003   Anemia    heavy menses   Endometriosis 02/2004   Fibroids, submucosal 02/2004   H/O: menorrhagia 02/2004   Hypertension    Hypothyroidism    Increased BMI 10/11/10   Ovarian cyst, right 03/19/2004   Simple endometrial hyperplasia 02/2004     Family History  Problem Relation Age of Onset   Hypotension Mother    Breast cancer Maternal Grandmother    Diabetes Maternal Grandmother      Social History   Socioeconomic History   Marital status: Single    Spouse name: Not on file   Number  of children: Not on file   Years of education: Not on file   Highest education level: Not on file  Occupational History   Not on file  Tobacco Use   Smoking status: Never   Smokeless tobacco: Not on file  Substance and Sexual Activity   Alcohol use: No   Drug use: No   Sexual activity: Never    Birth control/protection: Surgical  Other Topics Concern   Not on file  Social History Narrative   Right Handed   No Caffeine use    Social Determinants of Health   Financial Resource Strain: Not on file  Food Insecurity: Not on file  Transportation Needs: Not on file  Physical Activity: Not on file  Stress: Not on file  Social Connections: Not on file  Intimate Partner Violence: Not on file     Allergies  Allergen Reactions   Prilosec [Omeprazole]    Simvastatin      Outpatient Medications Prior to Visit  Medication Sig Dispense Refill   cyclobenzaprine (FLEXERIL) 10 MG tablet Take 1 tablet (10 mg total) by mouth at bedtime. 90 tablet 3   famotidine (PEPCID) 20 MG tablet Take 20 mg by mouth daily.     levothyroxine (SYNTHROID) 150 MCG tablet Take 150 mcg by mouth daily before breakfast.     predniSONE (DELTASONE) 20 MG tablet Take 20 mg by mouth daily with breakfast.     triamterene-hydrochlorothiazide (  MAXZIDE) 75-50 MG tablet Take 1 tablet by mouth daily.     Atogepant (QULIPTA) 60 MG TABS Take 60 mg by mouth at bedtime. 28 tablet 0   Multiple Vitamin (MULTIVITAMIN) tablet Take 1 tablet by mouth 2 (two) times daily.   (Patient not taking: Reported on 07/13/2022)     ondansetron (ZOFRAN-ODT) 8 MG disintegrating tablet Take 1 tablet (8 mg total) by mouth every 8 (eight) hours as needed for nausea or vomiting. (Patient not taking: Reported on 07/13/2022) 20 tablet 0   ZETIA 10 MG tablet Take 10 mg by mouth daily. (Patient not taking: Reported on 07/13/2022)     No facility-administered medications prior to visit.    Review of Systems  Constitutional:  Negative for chills,  fever, malaise/fatigue and weight loss.  HENT:  Negative for congestion, nosebleeds, sinus pain and sore throat.   Eyes:  Negative for photophobia, pain and discharge.  Respiratory:  Positive for shortness of breath. Negative for cough, hemoptysis, sputum production and wheezing.   Cardiovascular:  Negative for chest pain, palpitations, orthopnea and leg swelling.  Gastrointestinal:  Negative for abdominal pain, constipation, diarrhea, nausea and vomiting.  Genitourinary:  Negative for dysuria, frequency, hematuria and urgency.  Musculoskeletal:  Negative for back pain, joint pain, myalgias and neck pain.  Skin:  Negative for itching and rash.  Neurological:  Negative for tingling, tremors, sensory change, speech change, focal weakness, seizures, weakness and headaches.  Psychiatric/Behavioral:  Negative for memory loss, substance abuse and suicidal ideas. The patient is not nervous/anxious.       Objective:  Physical Exam   Vitals:   07/13/22 0851  BP: 106/68  Pulse: 67  Temp: 98 F (36.7 C)  TempSrc: Oral  SpO2: 100%  Weight: 215 lb (97.5 kg)  Height: 5' 11"$  (1.803 m)    Gen: well appearing, no acute distress HENT: NCAT, OP clear, neck supple without masses Eyes: PERRL, EOMi Lymph: no cervical lymphadenopathy PULM: CTA B CV: RRR, no mgr, no JVD GI: BS+, soft, nontender, no hsm Derm: no rash or skin breakdown MSK: normal bulk and tone Neuro: A&Ox4, CN II-XII intact, strength 5/5 in all 4 extremities Psyche: normal mood and affect   CBC    Component Value Date/Time   WBC 5.4 05/16/2022 1208   RBC 4.79 05/16/2022 1208   HGB 14.0 05/16/2022 1208   HCT 43.3 05/16/2022 1208   PLT 330 05/16/2022 1208   MCV 90.4 05/16/2022 1208   MCH 29.2 05/16/2022 1208   MCHC 32.3 05/16/2022 1208   RDW 13.2 05/16/2022 1208   LYMPHSABS 1.6 02/04/2009 1005   MONOABS 0.6 02/04/2009 1005   EOSABS 0.5 02/04/2009 1005   BASOSABS 0.1 02/04/2009 1005     Chest imaging: December  2024 coronary CT lung windows showed no pathology per radiology May 17, 2023 two-view chest x-ray images showed normal cardiac silhouette, normal pulmonary parenchyma, no masses or infiltrate  PFT:  Labs:  Path:  Echo:  Heart Catheterization:       Assessment & Plan:   DOE (dyspnea on exertion) - Plan: Pulmonary function test, CT Chest High Resolution  Temporal arteritis (HCC)  Discussion: Ms. Cure presents with shortness of breath after having COVID with some residual bronchitis type symptoms (audible crackles in throat when she breathes, some cough).  This is in the context of a recent poorly understood possible underlying autoimmune/inflammatory disease which has been treated with several rounds of prednisone.  She says that she has been told she may have  temporal arteritis though she has not had a biopsy to prove this.  The differential diagnosis includes simple post COVID bronchitis dyspnea versus less likely COVID induced lung injury versus some other inflammatory lung disease.  I suppose that asthma is also possible that she does not have many features of that.  Plan: Dyspnea after having COVID, recent temporal arteritis treatment: Full lung function test High-resolution CT scan of the chest Ambulatory telemetry testing  Follow-up with [place] in 4 to 6 weeks to go over these results, sooner if needed  Immunizations: Immunization History  Administered Date(s) Administered   Influenza-Unspecified 03/07/2019     Current Outpatient Medications:    cyclobenzaprine (FLEXERIL) 10 MG tablet, Take 1 tablet (10 mg total) by mouth at bedtime., Disp: 90 tablet, Rfl: 3   famotidine (PEPCID) 20 MG tablet, Take 20 mg by mouth daily., Disp: , Rfl:    levothyroxine (SYNTHROID) 150 MCG tablet, Take 150 mcg by mouth daily before breakfast., Disp: , Rfl:    predniSONE (DELTASONE) 20 MG tablet, Take 20 mg by mouth daily with breakfast., Disp: , Rfl:     triamterene-hydrochlorothiazide (MAXZIDE) 75-50 MG tablet, Take 1 tablet by mouth daily., Disp: , Rfl:    Atogepant (QULIPTA) 60 MG TABS, Take 60 mg by mouth at bedtime., Disp: 28 tablet, Rfl: 0   Multiple Vitamin (MULTIVITAMIN) tablet, Take 1 tablet by mouth 2 (two) times daily.   (Patient not taking: Reported on 07/13/2022), Disp: , Rfl:    ondansetron (ZOFRAN-ODT) 8 MG disintegrating tablet, Take 1 tablet (8 mg total) by mouth every 8 (eight) hours as needed for nausea or vomiting. (Patient not taking: Reported on 07/13/2022), Disp: 20 tablet, Rfl: 0   ZETIA 10 MG tablet, Take 10 mg by mouth daily. (Patient not taking: Reported on 07/13/2022), Disp: , Rfl:

## 2022-07-14 ENCOUNTER — Encounter: Payer: Self-pay | Admitting: Cardiology

## 2022-07-14 ENCOUNTER — Ambulatory Visit
Admission: RE | Admit: 2022-07-14 | Discharge: 2022-07-14 | Disposition: A | Discharge status: Home / Self Care | Payer: Medicaid Other | Source: Ambulatory Visit | Attending: Family Medicine | Admitting: Family Medicine

## 2022-07-14 ENCOUNTER — Ambulatory Visit: Payer: Medicaid Other | Attending: Cardiology | Admitting: Cardiology

## 2022-07-14 VITALS — BP 114/72 | HR 61 | Ht 71.0 in | Wt 213.6 lb

## 2022-07-14 DIAGNOSIS — G932 Benign intracranial hypertension: Secondary | ICD-10-CM

## 2022-07-14 DIAGNOSIS — E782 Mixed hyperlipidemia: Secondary | ICD-10-CM | POA: Diagnosis not present

## 2022-07-14 DIAGNOSIS — R0609 Other forms of dyspnea: Secondary | ICD-10-CM | POA: Diagnosis not present

## 2022-07-14 DIAGNOSIS — I10 Essential (primary) hypertension: Secondary | ICD-10-CM

## 2022-07-14 DIAGNOSIS — Z1231 Encounter for screening mammogram for malignant neoplasm of breast: Secondary | ICD-10-CM

## 2022-07-14 IMAGING — MR MR LUMBAR SPINE W/O CM
4 of 5 series · 27 of 48 positions shown · non-contrast
Comparison: Thoracic studies same day

CLINICAL DATA: Thoracic and lumbar region back pain extending to
the left side.

EXAM:
MRI LUMBAR SPINE WITHOUT CONTRAST
TECHNIQUE: Multiplanar, multisequence MR imaging of the lumbar spine was
performed. No intravenous contrast was administered.

[Series 1: T1 · sagittal · 4.0mm · 1.09mm/px · 6 of 21 slices shown (1 of 2)]
[im 1/21]
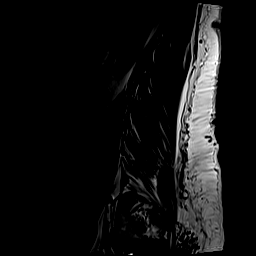
[im 5/21]
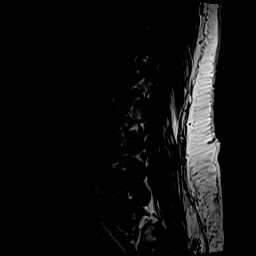
[im 9/21]
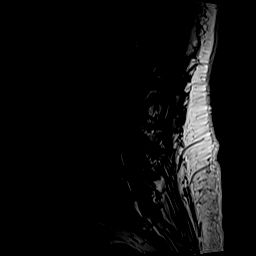
[im 13/21]
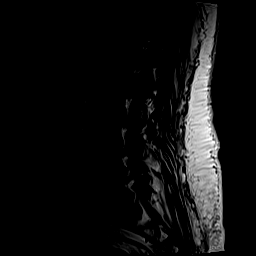
[im 17/21]
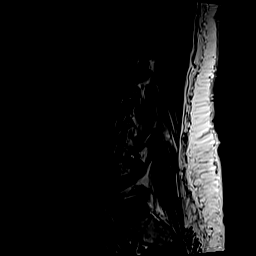
[im 21/21]
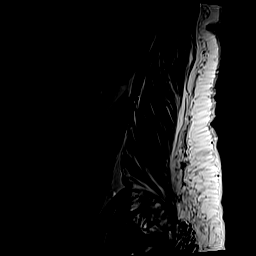

[Series 3: T2 · sagittal · 4.0mm · 1.09mm/px · 6 of 21 slices shown (1 of 2)]
[im 1/21]
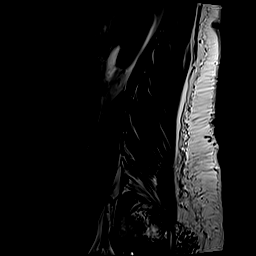
[im 5/21]
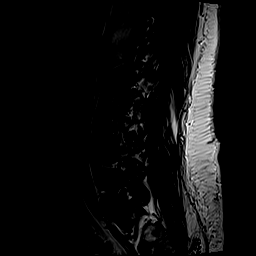
[im 9/21]
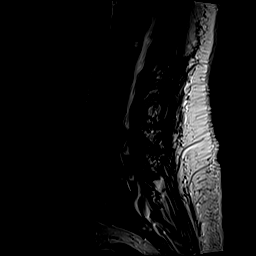
[im 13/21]
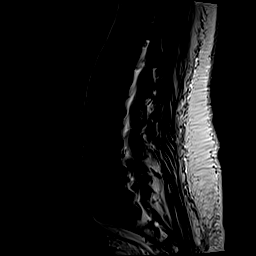
[im 17/21]
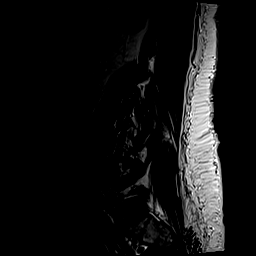
[im 21/21]
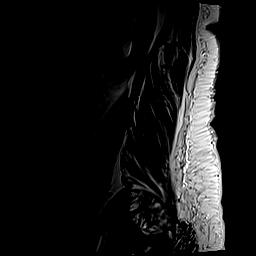

[Series 6: T1 · axial · 4.0mm · 0.39mm/px · z∈[-166,+53]mm · 6 of 54 slices shown (2 of 2)]
[im 1/54]
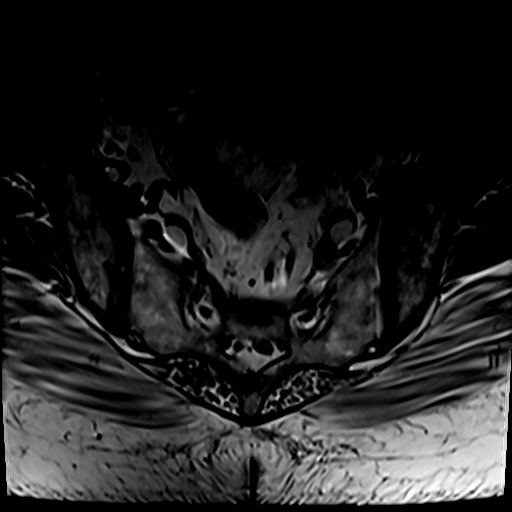
[im 8/54]
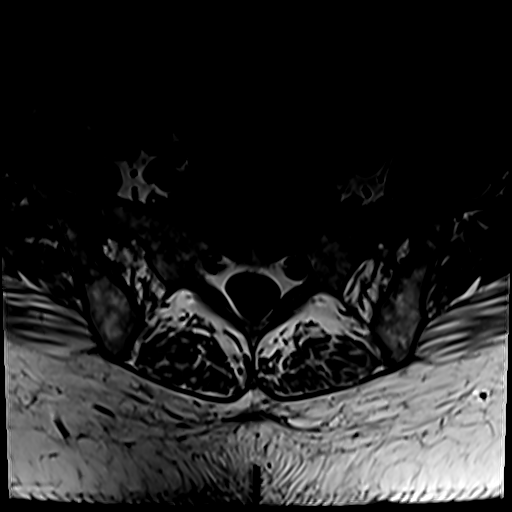
[im 16/54]
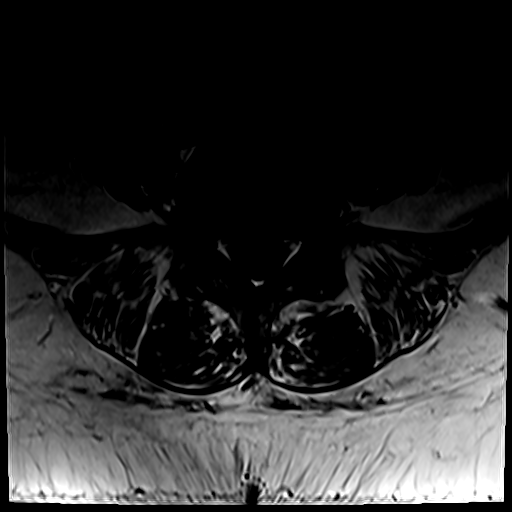
[im 23/54]
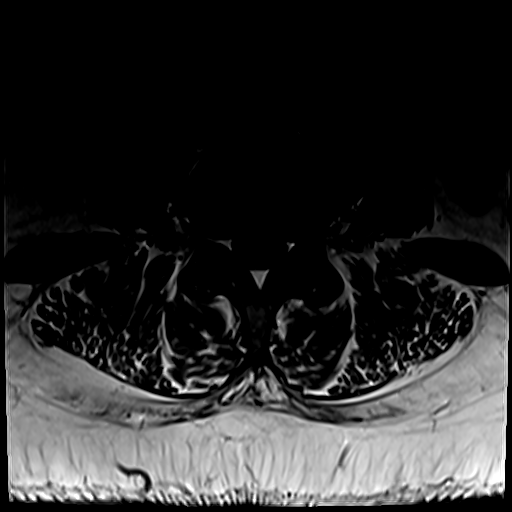
[im 27/54]
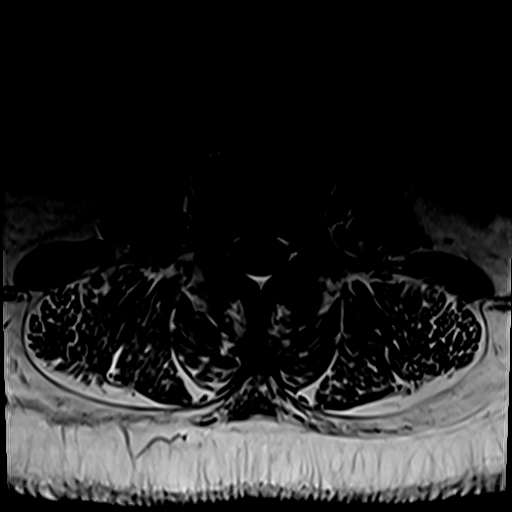
[im 46/54]
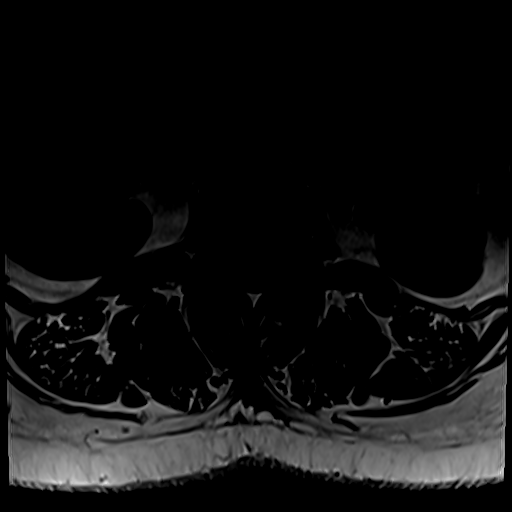

[Series 9: T2 · axial · 4.0mm · 0.39mm/px · z∈[-166,+93]mm · 9 of 54 slices shown (2 of 2)]
[im 1/54]
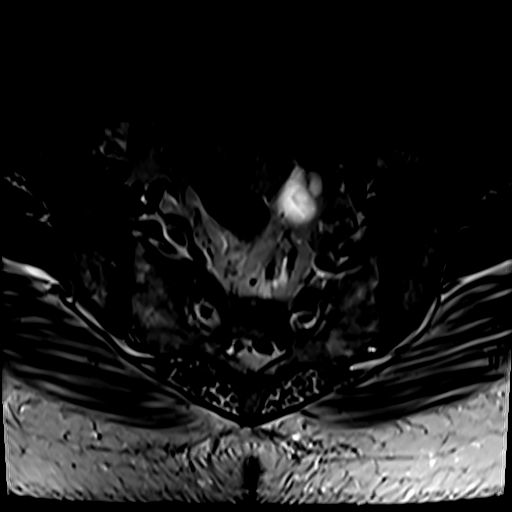
[im 8/54]
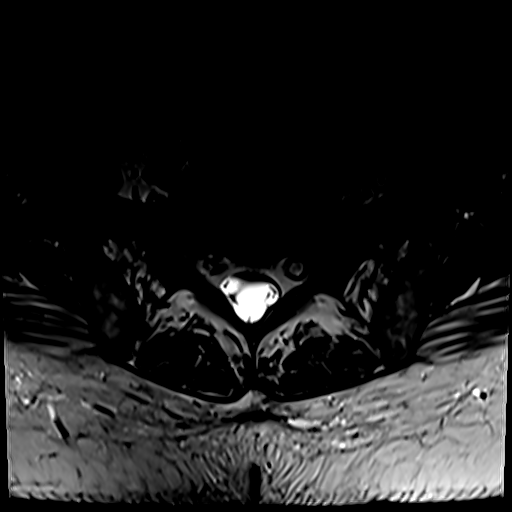
[im 16/54]
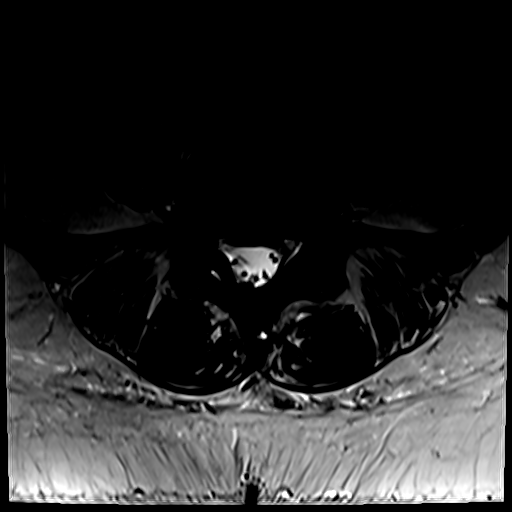
[im 23/54]
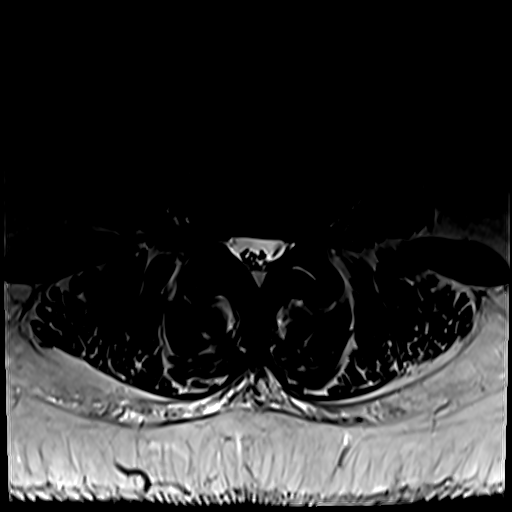
[im 27/54]
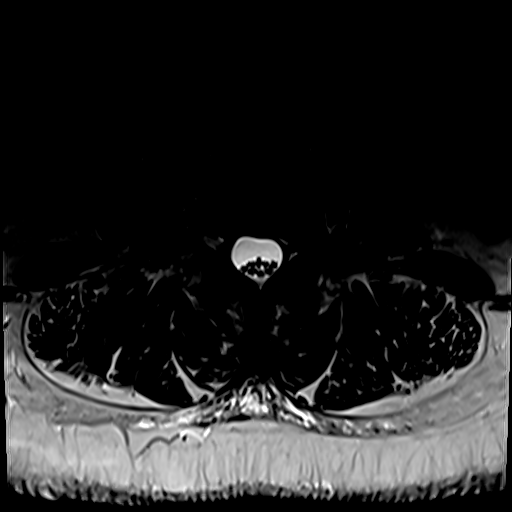
[im 31/54]
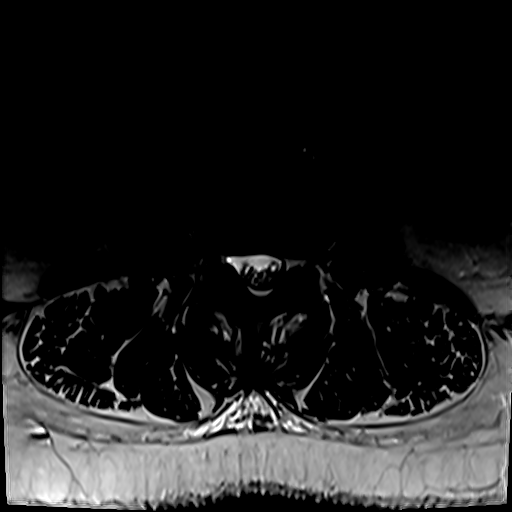
[im 38/54]
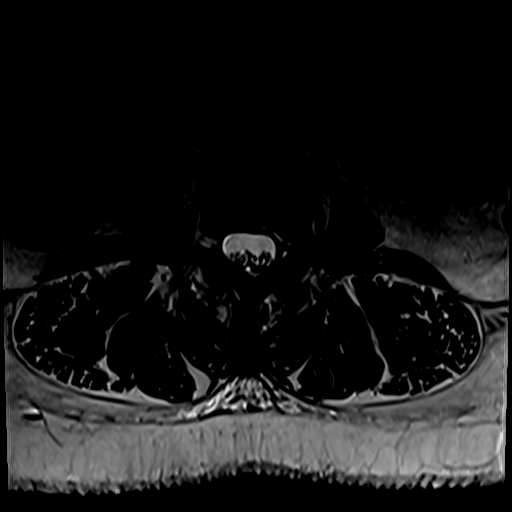
[im 46/54]
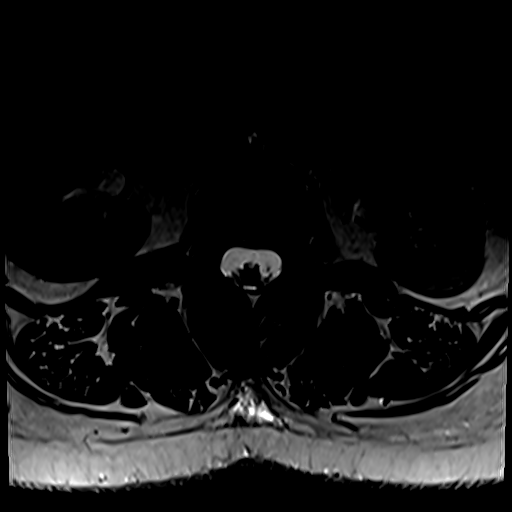
[im 54/54]
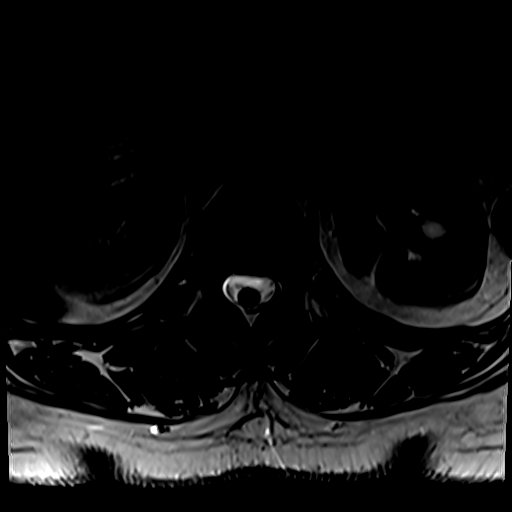

[27 of 48 positions shown; findings below may reference images not displayed]

FINDINGS: Segmentation: Based on counting from the thoracic region, S1 is
lumbarized.

Alignment:  No lumbar malalignment.

Vertebrae: No lumbar region fracture or focal bone lesion. Chronic
discogenic and late marrow changes at L5-S1 and S1-S2. Scattered
areas of focal benign fat noted within L1 and L2.

Conus medullaris and cauda equina: Conus extends to the L2-3 level.
Conus and cauda equina appear normal.

Paraspinal and other soft tissues: Negative

Disc levels:

No disc abnormality seen at L1-2 or L2-3.

L3-4: Mild noncompressive disc bulge.

L4-5: Minimal noncompressive disc bulge. Mild facet osteoarthritis
which could contribute to low back pain.

L5-S1: Disc degeneration with loss of disc height and bulging of the
disc. Bilateral facet degeneration and hypertrophy. Mild stenosis of
both lateral recesses but without definite neural compression.
Chronic discogenic endplate marrow changes as noted above. Findings
at this level could relate to low back pain.

S1-2: Disc degeneration with loss of disc height and bulging of the
disc. Facet degeneration and hypertrophy. Mild stenosis of the
lateral recesses but without visible neural compression. Chronic
discogenic endplate marrow changes as noted above. Findings at this
level could relate to low back pain.
IMPRESSION: S1 is a lumbarized vertebra.

Lumbar region degenerative disc disease and degenerative facet
disease as outlined above, which could be a cause of lumbar region
back pain. There is mild stenosis of the subarticular lateral
recesses at L5-S1 and S1-S2, but no definite neural compression.

## 2022-07-14 NOTE — Patient Instructions (Signed)
Medication Instructions:  Your physician recommends that you continue on your current medications as directed. Please refer to the Current Medication list given to you today.  *If you need a refill on your cardiac medications before your next appointment, please call your pharmacy*   Lab Work: None  Testing/Procedures: Your physician has requested that you have an echocardiogram. Echocardiography is a painless test that uses sound waves to create images of your heart. It provides your doctor with information about the size and shape of your heart and how well your heart's chambers and valves are working. This procedure takes approximately one hour. There are no restrictions for this procedure. Please do NOT wear cologne, perfume, aftershave, or lotions (deodorant is allowed). Please arrive 15 minutes prior to your appointment time.    Follow-Up: At [facility], you and your health needs are our priority.  As part of our continuing mission to provide you with exceptional heart care, we have created designated Provider Care Teams.  These Care Teams include your primary Cardiologist (physician) and Advanced Practice Providers (APPs -  Physician Assistants and Nurse Practitioners) who all work together to provide you with the care you need, when you need it.  Your next appointment:   1 year(s)  Provider:   Berniece Salines, DO

## 2022-07-14 NOTE — Progress Notes (Signed)
Cardiology Office Note:    Date:  07/14/2022   ID:  HEAHTER BILEK, DOB 02/20/1966, MRN [id number]  PCP:  Caren Macadam, MD  Cardiologist:  Berniece Salines, DO  Electrophysiologist:  None   Referring MD: Caren Macadam, MD   " I am doing well"  History of Present Illness:    Teresa Kent is a 57 y.o. female with a hx of hypertension, hyperlipidemia,endometriosis with fibroids, hypothyroidism here today to be evaluated for chest pain.   I saw the patient back in November at the time she presented for intermittent chest discomfort.  During that visit I sent the patient for coronary CTA.  Coronary CTA was normal.  She is here today because she recently was sick with COVID and was seen in the emergency department.  She was told that her EKG was abnormal.  But her major problem that she tells me is the fact that she has been experiencing worsening shortness of breath on exertion.  No chest pain.  Past Medical History:  Diagnosis Date   Abnormal Pap smear 02/2003   Anemia    heavy menses   Endometriosis 02/2004   Fibroids, submucosal 02/2004   H/O: menorrhagia 02/2004   Hypertension    Hypothyroidism    Increased BMI 10/11/10   Ovarian cyst, right 03/19/2004   Simple endometrial hyperplasia 02/2004    Past Surgical History:  Procedure Laterality Date   DILATION AND CURETTAGE OF UTERUS  03/19/2004   DILITATION & CURRETTAGE/HYSTROSCOPY WITH NOVASURE ABLATION N/A 12/20/2012   Procedure: DILATATION & CURETTAGE/HYSTEROSCOPY WITH NOVASURE ABLATION;  Surgeon: Delice Lesch, MD;  Location: [facility];  Service: Gynecology;  Laterality: N/A;   OVARY SURGERY     resection of fibroid  03/19/2004   THYROID SURGERY     TUBAL LIGATION      Current Medications: Current Meds  Medication Sig   cyclobenzaprine (FLEXERIL) 10 MG tablet Take 1 tablet (10 mg total) by mouth at bedtime.   ezetimibe (ZETIA) 10 MG tablet 1 tablet Orally Once a day for 30 days   famotidine (PEPCID) 20 MG  tablet Take 20 mg by mouth daily.   levothyroxine (SYNTHROID) 150 MCG tablet Take 150 mcg by mouth daily before breakfast.   predniSONE (DELTASONE) 20 MG tablet Take 20 mg by mouth daily with breakfast.   triamterene-hydrochlorothiazide (MAXZIDE) 75-50 MG tablet Take 1 tablet by mouth daily.     Allergies:   Prilosec [omeprazole] and Simvastatin   Social History   Socioeconomic History   Marital status: Single    Spouse name: Not on file   Number of children: Not on file   Years of education: Not on file   Highest education level: Not on file  Occupational History   Not on file  Tobacco Use   Smoking status: Never   Smokeless tobacco: Not on file  Substance and Sexual Activity   Alcohol use: No   Drug use: No   Sexual activity: Never    Birth control/protection: Surgical  Other Topics Concern   Not on file  Social History Narrative   Right Handed   No Caffeine use    Social Determinants of Health   Financial Resource Strain: Not on file  Food Insecurity: Not on file  Transportation Needs: Not on file  Physical Activity: Not on file  Stress: Not on file  Social Connections: Not on file     Family History: The patient's family history includes Breast cancer in her maternal grandmother; Diabetes  in her maternal grandmother; Hypotension in her mother.  ROS:   Review of Systems  Constitution: Negative for decreased appetite, fever and weight gain.  HENT: Negative for congestion, ear discharge, hoarse voice and sore throat.   Eyes: Negative for discharge, redness, vision loss in right eye and visual halos.  Cardiovascular: Reports chest pain.  Negative for dyspnea on exertion, leg swelling, orthopnea and palpitations.  Respiratory: Negative for cough, hemoptysis, shortness of breath and snoring.   Endocrine: Negative for heat intolerance and polyphagia.  Hematologic/Lymphatic: Negative for bleeding problem. Does not bruise/bleed easily.  Skin: Negative for flushing,  nail changes, rash and suspicious lesions.  Musculoskeletal: Negative for arthritis, joint pain, muscle cramps, myalgias, neck pain and stiffness.  Gastrointestinal: Negative for abdominal pain, bowel incontinence, diarrhea and excessive appetite.  Genitourinary: Negative for decreased libido, genital sores and incomplete emptying.  Neurological: Negative for brief paralysis, focal weakness, headaches and loss of balance.  Psychiatric/Behavioral: Negative for altered mental status, depression and suicidal ideas.  Allergic/Immunologic: Negative for HIV exposure and persistent infections.    EKGs/Labs/Other Studies Reviewed:    The following studies were reviewed today:   EKG: EKG performed today shows normal sinus rhythm with no ST segment abnormalities.  LVH by voltage criteria.  Recent Labs: 04/12/2022: TSH 0.928 05/16/2022: BUN 7; Creatinine, Ser 0.76; Hemoglobin 14.0; Platelets 330; Potassium 3.5; Sodium 141  Recent Lipid Panel No results found for: "CHOL", "TRIG", "HDL", "CHOLHDL", "VLDL", "LDLCALC", "LDLDIRECT"  Physical Exam:    VS:  BP 114/72   Pulse 61   Ht 5' 11"$  (1.803 m)   Wt 96.9 kg   LMP 10/31/2019   SpO2 99%   BMI 29.79 kg/m     Wt Readings from Last 3 Encounters:  07/14/22 96.9 kg  07/13/22 97.5 kg  07/05/22 97.1 kg     GEN: Well nourished, well developed in no acute distress HEENT: Normal NECK: No JVD; No carotid bruits LYMPHATICS: No lymphadenopathy CARDIAC: S1S2 noted,RRR, no murmurs, rubs, gallops RESPIRATORY:  Clear to auscultation without rales, wheezing or rhonchi  ABDOMEN: Soft, non-tender, non-distended, +bowel sounds, no guarding. EXTREMITIES: No edema, No cyanosis, no clubbing MUSCULOSKELETAL:  No deformity  SKIN: Warm and dry NEUROLOGIC:  Alert and oriented x 3, non-focal PSYCHIATRIC:  Normal affect, good insight  ASSESSMENT:    1. DOE (dyspnea on exertion)   2. Primary hypertension   3. IIH (idiopathic intracranial hypertension)    4. Mixed hyperlipidemia     PLAN:    Her recent coronary CTA was normal no evidence of coronary artery disease.  Her EKG is not concerning at this time.  Compared to prior EKG no significant change.  Recent COVID  infection she does have dyspnea on exertion from this.  Again echocardiogram to assess LV function.  The patient understands the need to lose weight with diet and exercise. We have discussed specific strategies for this.  The patient is in agreement with the above plan. The patient left the office in stable condition.  The patient will follow up in 12 months.   Medication Adjustments/Labs and Tests Ordered: Current medicines are reviewed at length with the patient today.  Concerns regarding medicines are outlined above.  Orders Placed This Encounter  Procedures   EKG 12-Lead   ECHOCARDIOGRAM COMPLETE   No orders of the defined types were placed in this encounter.   Patient Instructions  Medication Instructions:  Your physician recommends that you continue on your current medications as directed. Please refer to the Current  Medication list given to you today.  *If you need a refill on your cardiac medications before your next appointment, please call your pharmacy*   Lab Work: None  Testing/Procedures: Your physician has requested that you have an echocardiogram. Echocardiography is a painless test that uses sound waves to create images of your heart. It provides your doctor with information about the size and shape of your heart and how well your heart's chambers and valves are working. This procedure takes approximately one hour. There are no restrictions for this procedure. Please do NOT wear cologne, perfume, aftershave, or lotions (deodorant is allowed). Please arrive 15 minutes prior to your appointment time.    Follow-Up: At [facility], you and your health needs are our priority.  As part of our continuing mission to provide you with exceptional  heart care, we have created designated Provider Care Teams.  These Care Teams include your primary Cardiologist (physician) and Advanced Practice Providers (APPs -  Physician Assistants and Nurse Practitioners) who all work together to provide you with the care you need, when you need it.  Your next appointment:   1 year(s)  Provider:   Berniece Salines, DO     Adopting a Healthy Lifestyle.  Know what a healthy weight is for you (roughly BMI <25) and aim to maintain this   Aim for 7+ servings of fruits and vegetables daily   65-80+ fluid ounces of water or unsweet tea for healthy kidneys   Limit to max 1 drink of alcohol per day; avoid smoking/tobacco   Limit animal fats in diet for cholesterol and heart health - choose grass fed whenever available   Avoid highly processed foods, and foods high in saturated/trans fats   Aim for low stress - take time to unwind and care for your mental health   Aim for 150 min of moderate intensity exercise weekly for heart health, and weights twice weekly for bone health   Aim for 7-9 hours of sleep daily   When it comes to diets, agreement about the perfect plan isnt easy to find, even among the experts. Experts at the [employer] developed an idea known as the Healthy Eating Plate. Just imagine a plate divided into logical, healthy portions.   The emphasis is on diet quality:   Load up on vegetables and fruits - one-half of your plate: Aim for color and variety, and remember that potatoes dont count.   Go for whole grains - one-quarter of your plate: Whole wheat, barley, wheat berries, quinoa, oats, brown rice, and foods made with them. If you want pasta, go with whole wheat pasta.   Protein power - one-quarter of your plate: Fish, chicken, beans, and nuts are all healthy, versatile protein sources. Limit red meat.   The diet, however, does go beyond the plate, offering a few other suggestions.   Use healthy plant oils,  such as olive, canola, soy, corn, sunflower and peanut. Check the labels, and avoid partially hydrogenated oil, which have unhealthy trans fats.   If youre thirsty, drink water. Coffee and tea are good in moderation, but skip sugary drinks and limit milk and dairy products to one or two daily servings.   The type of carbohydrate in the diet is more important than the amount. Some sources of carbohydrates, such as vegetables, fruits, whole grains, and beans-are healthier than others.   Finally, stay active  Rolly Pancake, DO  07/14/2022 9:44 AM    [facility]

## 2022-07-18 ENCOUNTER — Ambulatory Visit (HOSPITAL_COMMUNITY): Payer: Medicaid Other | Attending: Cardiology

## 2022-07-18 DIAGNOSIS — R0609 Other forms of dyspnea: Secondary | ICD-10-CM | POA: Diagnosis present

## 2022-07-18 LAB — ECHOCARDIOGRAM COMPLETE
Area-P 1/2: 2.95 cm2
S' Lateral: 3 cm

## 2022-07-19 ENCOUNTER — Ambulatory Visit
Admission: RE | Admit: 2022-07-19 | Discharge: 2022-07-19 | Disposition: A | Discharge status: Home / Self Care | Payer: Medicaid Other | Source: Ambulatory Visit | Attending: Gastroenterology | Admitting: Gastroenterology

## 2022-07-19 DIAGNOSIS — R1310 Dysphagia, unspecified: Secondary | ICD-10-CM

## 2022-07-20 ENCOUNTER — Ambulatory Visit: Payer: Medicaid Other | Admitting: Physical Therapy

## 2022-07-20 ENCOUNTER — Encounter: Payer: Self-pay | Admitting: Physical Therapy

## 2022-07-20 DIAGNOSIS — M542 Cervicalgia: Secondary | ICD-10-CM | POA: Diagnosis not present

## 2022-07-20 DIAGNOSIS — M62838 Other muscle spasm: Secondary | ICD-10-CM

## 2022-07-20 DIAGNOSIS — R278 Other lack of coordination: Secondary | ICD-10-CM

## 2022-07-20 DIAGNOSIS — R262 Difficulty in walking, not elsewhere classified: Secondary | ICD-10-CM

## 2022-07-20 NOTE — Therapy (Signed)
OUTPATIENT PHYSICAL THERAPY CERVICAL EVALUATION   Patient Name: Teresa Kent MRN: [id number] DOB:Feb 04, 1966, 57 y.o., female Today's Date: 07/20/2022  END OF SESSION:  PT End of Session - 07/20/22 1108     Visit Number 2    Date for PT Re-Evaluation 09/30/22    PT Start Time 1106    PT Stop Time 1140    PT Time Calculation (min) 34 min    Activity Tolerance Patient limited by pain    Behavior During Therapy [facility] for tasks assessed/performed             Past Medical History:  Diagnosis Date   Abnormal Pap smear 02/2003   Anemia    heavy menses   Endometriosis 02/2004   Fibroids, submucosal 02/2004   H/O: menorrhagia 02/2004   Hypertension    Hypothyroidism    Increased BMI 10/11/10   Ovarian cyst, right 03/19/2004   Simple endometrial hyperplasia 02/2004   Past Surgical History:  Procedure Laterality Date   DILATION AND CURETTAGE OF UTERUS  03/19/2004   DILITATION & CURRETTAGE/HYSTROSCOPY WITH NOVASURE ABLATION N/A 12/20/2012   Procedure: DILATATION & CURETTAGE/HYSTEROSCOPY WITH NOVASURE ABLATION;  Surgeon: Delice Lesch, MD;  Location: [facility];  Service: Gynecology;  Laterality: N/A;   OVARY SURGERY     resection of fibroid  03/19/2004   THYROID SURGERY     TUBAL LIGATION     Patient Active Problem List   Diagnosis Date Noted   Osteoarthritis of cervical spine 07/05/2022   Cervicogenic headache 07/05/2022   Chronic intractable headache 06/07/2022   Cervical stenosis of spinal canal 06/07/2022   IIH (idiopathic intracranial hypertension) 04/19/2022   Hidradenitis suppurativa 12/19/2011    PCP: Caren Macadam, MD  REFERRING PROVIDER: Dawley, Theodoro Doing, DO  REFERRING DIAG: M54.2 (ICD-10-CM) - Cervicalgia   THERAPY DIAG:  Cervicalgia  Difficulty in walking, not elsewhere classified  Other lack of coordination  Other muscle spasm  Rationale for Evaluation and Treatment: Rehabilitation  ONSET DATE: 06/27/22  SUBJECTIVE:                                                                                                                                                                                                          SUBJECTIVE STATEMENT: Patient reports pressure in her head since last November. No one can identify the exact cause. MRI shows arthritis in her entire spine. She has experienced some trembling in her back and UE.  PERTINENT HISTORY:  Per referring Physician: Patient reports Idiopathic Intracranial Hypertension and headaches, primarily on her B frontal and  temporal region-pressure. Also C/O Occipital and suboccipital headaches. Treated for pseudotumor cerebral with Diamox, but could not tolerate the medication. Denies blurred vision denies N/V, or balance issues. C/O R sided pulsatile tinnitus since Thyroid surgery. C/O intermittent numbness in R hand and calf, L toes. Occasionally drops things in R hand. 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 No cord compression or signal change  PAIN:  Are you having pain? Yes: NPRS scale: 6-7/10 Pain location: R head and neck, B thoracic spine between shoulder blades, and lower lumbar spine Pain description: shaky, inflamed, pulling, pressure in head, stiffness in shoulders Aggravating factors: Nothing in particular-maybe sitting too long Relieving factors: Prednisone helps some, Tylenol arthritis, heat Lying flat is impossible for sleeping PRECAUTIONS: None  WEIGHT BEARING RESTRICTIONS: No  FALLS:  Has patient fallen in last 6 months? 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  PLOF: Independent and Washing her hair causes severe pain  PATIENT GOALS: Relieve pressure in her head and neck, return to her previous level of activity without pain  NEXT MD VISIT: Not  scheduled.  OBJECTIVE:   DIAGNOSTIC FINDINGS:  MRI 05/07/22 IMPRESSION: 1. 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 Mild-to-moderate bilateral foraminal stenosis C4-C5. A CT of the cervical spine may be useful to assess for OPLL, if clinically warranted. 2. Enlarged and partially empty sella, which is often a normal anatomic variant but can be associated with idiopathic intracranial hypertension (pseudotumor cerebri)  COGNITION: Overall cognitive status: Within functional limits for tasks assessed  SENSATION: WFL  POSTURE: rounded shoulders, forward head, increased lumbar lordosis, and anterior pelvic tilt  PALPATION: Severely tight and TTP in B upper traps, LS, TP in B up traps, L > R. B SCM very tight and TTP as well as scalenes. B cerv paraspinals also tight and TTP, R > L.  CERVICAL ROM:   Active ROM A/PROM (deg) eval  Flexion WNL  Extension WNL  Right lateral flexion 70%, feels tight  Left lateral flexion 75%, feels tight  Right rotation WNL, feels tight  Left rotation WNL, feels tight   (Blank rows = not tested)  UPPER EXTREMITY ROM: WFL, but painful in elevation and reaching to wash her hair.  UPPER EXTREMITY MMT: 5/5   CERVICAL SPECIAL TESTS:  Spurling's test: Positive and Distraction test: Positive  FUNCTIONAL TESTS:  Quick DASH test -26, 34%  TODAY'S TREATMENT:                                                                                                                              DATE:  07/20/22 NuStep using U and LE L5 x 5 minutes STM to  upper traps, cerv paraspinals, LS, scalenes and SCM, R tihgter than L, F/B stretch Supine chin tucks with shoulder  Seated stretching for Up traps, LS, cervical paraspinals  07/08/22  Education  PATIENT EDUCATION:  Education details: POC Person  educated: Patient Education method: Explanation Education comprehension: verbalized understanding  HOME EXERCISE PROGRAM: TBD  ASSESSMENT:  CLINICAL IMPRESSION: Patient reports continued pressure in her head. Treatment addressed STM to all sub occipital and upper shoulder muscles as well as anterior neck muscles. Stretching to same. HEP initiated for stretch. She did report decreased pressure in head after treatment.  OBJECTIVE IMPAIRMENTS: decreased activity tolerance, decreased balance, decreased coordination, difficulty walking, and pain.   ACTIVITY LIMITATIONS: carrying, lifting, bending, squatting, and locomotion level  PARTICIPATION LIMITATIONS: cleaning, laundry, shopping, community activity, and occupation  PERSONAL FACTORS: Past/current experiences are also affecting patient's functional outcome.   REHAB POTENTIAL: Good  CLINICAL DECISION MAKING: Evolving/moderate complexity  EVALUATION COMPLEXITY: Moderate   GOALS: Goals reviewed with patient? Yes  SHORT TERM GOALS: Target date: 07/23/22  I with initial HEP Baseline:  Goal status: 07/20/22-provided-ongoing  LONG TERM GOALS: Target date: 09/30/22  I with final HEP Baseline:  Goal status: INITIAL  2.  Patient will recover full painfree ROM in her neck Baseline: Mildly limited, but painful Goal status: INITIAL  3.  Improve Quick Dash score to < 15, < 10% disability Baseline: 26, 34% disability Goal status: INITIAL  4.  Patient will return to sleeping in a horizontal position Baseline: Unable to lie flat. Goal status: INITIAL  5.  Patient will report the ability to perform all her normal daily activities with pain < 3/10 Baseline: 7/10 Goal status: INITIAL  6.  Ambulate at least 20 minutes with pain < 3/10 Baseline: Limited gait due to pain Goal status: INITIAL   PLAN:  PT FREQUENCY: 1-2x/week  PT DURATION: 12 weeks  PLANNED INTERVENTIONS: Therapeutic exercises, Therapeutic activity,  Neuromuscular re-education, Balance training, Gait training, Patient/Family education, Self Care, Joint mobilization, Dry Needling, Electrical stimulation, Cryotherapy, Moist heat, Taping, Traction, Ultrasound, Ionotophoresis '4mg'$ /ml Dexamethasone, and Manual therapy  PLAN FOR NEXT SESSION: STM, DN? HEP   Marcelina Morel, DPT 07/20/2022, 12:58 PM

## 2022-07-22 ENCOUNTER — Encounter: Payer: Self-pay | Admitting: Physical Therapy

## 2022-07-22 ENCOUNTER — Ambulatory Visit: Payer: Medicaid Other | Attending: Neurological Surgery | Admitting: Physical Therapy

## 2022-07-22 DIAGNOSIS — R278 Other lack of coordination: Secondary | ICD-10-CM

## 2022-07-22 DIAGNOSIS — M542 Cervicalgia: Secondary | ICD-10-CM

## 2022-07-22 DIAGNOSIS — R262 Difficulty in walking, not elsewhere classified: Secondary | ICD-10-CM

## 2022-07-22 DIAGNOSIS — M62838 Other muscle spasm: Secondary | ICD-10-CM

## 2022-07-22 NOTE — Therapy (Signed)
OUTPATIENT PHYSICAL THERAPY CERVICAL EVALUATION   Patient Name: Teresa Kent MRN: [id number] DOB:01/01/1966, 57 y.o., female Today's Date: 07/22/2022  END OF SESSION:  PT End of Session - 07/22/22 0843     Visit Number 3    Date for PT Re-Evaluation 09/30/22    PT Start Time 0840    PT Stop Time 0920    PT Time Calculation (min) 40 min    Activity Tolerance Patient limited by pain    Behavior During Therapy [facility] for tasks assessed/performed             Past Medical History:  Diagnosis Date   Abnormal Pap smear 02/2003   Anemia    heavy menses   Endometriosis 02/2004   Fibroids, submucosal 02/2004   H/O: menorrhagia 02/2004   Hypertension    Hypothyroidism    Increased BMI 10/11/10   Ovarian cyst, right 03/19/2004   Simple endometrial hyperplasia 02/2004   Past Surgical History:  Procedure Laterality Date   DILATION AND CURETTAGE OF UTERUS  03/19/2004   DILITATION & CURRETTAGE/HYSTROSCOPY WITH NOVASURE ABLATION N/A 12/20/2012   Procedure: DILATATION & CURETTAGE/HYSTEROSCOPY WITH NOVASURE ABLATION;  Surgeon: Delice Lesch, MD;  Location: [facility];  Service: Gynecology;  Laterality: N/A;   OVARY SURGERY     resection of fibroid  03/19/2004   THYROID SURGERY     TUBAL LIGATION     Patient Active Problem List   Diagnosis Date Noted   Osteoarthritis of cervical spine 07/05/2022   Cervicogenic headache 07/05/2022   Chronic intractable headache 06/07/2022   Cervical stenosis of spinal canal 06/07/2022   IIH (idiopathic intracranial hypertension) 04/19/2022   Hidradenitis suppurativa 12/19/2011    PCP: Caren Macadam, MD  REFERRING PROVIDER: Dawley, Theodoro Doing, DO  REFERRING DIAG: M54.2 (ICD-10-CM) - Cervicalgia   THERAPY DIAG:  Cervicalgia  Difficulty in walking, not elsewhere classified  Other lack of coordination  Other muscle spasm  Rationale for Evaluation and Treatment: Rehabilitation  ONSET DATE: 06/27/22  SUBJECTIVE:                                                                                                                                                                                                          SUBJECTIVE STATEMENT: Patient reports some relief after last treatment, but continued pressure. Exacerbated after getting her hair down with braids.  PERTINENT HISTORY:  Per referring Physician: Patient reports Idiopathic Intracranial Hypertension and headaches, primarily on her B frontal and temporal region-pressure. Also C/O Occipital and suboccipital headaches. Treated for pseudotumor cerebral with Diamox, but  could not tolerate the medication. Denies blurred vision denies N/V, or balance issues. C/O R sided pulsatile tinnitus since Thyroid surgery. C/O intermittent numbness in R hand and calf, L toes. Occasionally drops things in R hand. 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 No cord compression or signal change  PAIN:  Are you having pain? Yes: NPRS scale: 6-7/10 Pain location: R head and neck, B thoracic spine between shoulder blades, and lower lumbar spine Pain description: shaky, inflamed, pulling, pressure in head, stiffness in shoulders Aggravating factors: Nothing in particular-maybe sitting too long Relieving factors: Prednisone helps some, Tylenol arthritis, heat Lying flat is impossible for sleeping PRECAUTIONS: None  WEIGHT BEARING RESTRICTIONS: No  FALLS:  Has patient fallen in last 6 months? 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  PLOF: Independent and Washing her hair causes severe pain  PATIENT GOALS: Relieve pressure in her head and neck, return to her previous level of activity without pain  NEXT MD VISIT: Not scheduled.  OBJECTIVE:   DIAGNOSTIC FINDINGS:  MRI 05/07/22 IMPRESSION: 1.  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 Mild-to-moderate bilateral foraminal stenosis C4-C5. A CT of the cervical spine may be useful to assess for OPLL, if clinically warranted. 2. Enlarged and partially empty sella, which is often a normal anatomic variant but can be associated with idiopathic intracranial hypertension (pseudotumor cerebri)  COGNITION: Overall cognitive status: Within functional limits for tasks assessed  SENSATION: WFL  POSTURE: rounded shoulders, forward head, increased lumbar lordosis, and anterior pelvic tilt  PALPATION: Severely tight and TTP in B upper traps, LS, TP in B up traps, L > R. B SCM very tight and TTP as well as scalenes. B cerv paraspinals also tight and TTP, R > L.  CERVICAL ROM:   Active ROM A/PROM (deg) eval  Flexion WNL  Extension WNL  Right lateral flexion 70%, feels tight  Left lateral flexion 75%, feels tight  Right rotation WNL, feels tight  Left rotation WNL, feels tight   (Blank rows = not tested)  UPPER EXTREMITY ROM: WFL, but painful in elevation and reaching to wash her hair.  UPPER EXTREMITY MMT: 5/5   CERVICAL SPECIAL TESTS:  Spurling's test: Positive and Distraction test: Positive  FUNCTIONAL TESTS:  Quick DASH test -26, 34%  TODAY'S TREATMENT:                                                                                                                              DATE:  07/22/22 NuStep L5 x 5 minutes MH to soften tissues x 5 min, cerv STM to Up Traps, LS, sub occipital muscles, with head pull Attempted mechanical  traction, but she did not tolerate, caused muscle spasm in L sub occiptial muscles. Seated self stretch and ROM to her neck in all planes Doorway stretch to chest  07/20/22 NuStep using U and LE L5 x 5 minutes STM to upper traps, cerv  paraspinals, LS, scalenes and SCM, R tihgter than L, F/B stretch Supine chin tucks with shoulder  Seated stretching for Up traps, LS, cervical paraspinals  07/08/22  Education  PATIENT EDUCATION:  Education details: POC Person educated: Patient Education method: Explanation Education comprehension: verbalized understanding  HOME EXERCISE PROGRAM: TBD  ASSESSMENT:  CLINICAL IMPRESSION: Patient reports some relief after last treatment, but the pressure returned. She had her hair done with braids put in. The hairdresser hit her with the [place], which was quite painful and the tightness of the braids as well as bobbi pins are sore. She did not tolerate mechanical traction. She may respond well to DN for TP relief and will also initiate some scapular stability exercises to improve her postural control on next visit.  OBJECTIVE IMPAIRMENTS: decreased activity tolerance, decreased balance, decreased coordination, difficulty walking, and pain.   ACTIVITY LIMITATIONS: carrying, lifting, bending, squatting, and locomotion level  PARTICIPATION LIMITATIONS: cleaning, laundry, shopping, community activity, and occupation  PERSONAL FACTORS: Past/current experiences are also affecting patient's functional outcome.   REHAB POTENTIAL: Good  CLINICAL DECISION MAKING: Evolving/moderate complexity  EVALUATION COMPLEXITY: Moderate   GOALS: Goals reviewed with patient? Yes  SHORT TERM GOALS: Target date: 07/23/22  I with initial HEP Baseline:  Goal status: 07/20/22-provided-ongoing  LONG TERM GOALS: Target date: 09/30/22  I with final HEP Baseline:  Goal status: INITIAL  2.  Patient will recover full painfree ROM in her neck Baseline: Mildly limited, but painful Goal status: 07/22/22 ongoing  3.  Improve Quick Dash score to < 15, < 10% disability Baseline: 26, 34% disability Goal status: INITIAL  4.  Patient will return to sleeping in a horizontal position Baseline: Unable to lie  flat. Goal status: 07/22/22 ongoing  5.  Patient will report the ability to perform all her normal daily activities with pain < 3/10 Baseline: 7/10 Goal status: INITIAL  6.  Ambulate at least 20 minutes with pain < 3/10 Baseline: Limited gait due to pain Goal status: INITIAL   PLAN:  PT FREQUENCY: 1-2x/week  PT DURATION: 12 weeks  PLANNED INTERVENTIONS: Therapeutic exercises, Therapeutic activity, Neuromuscular re-education, Balance training, Gait training, Patient/Family education, Self Care, Joint mobilization, Dry Needling, Electrical stimulation, Cryotherapy, Moist heat, Taping, Traction, Ultrasound, Ionotophoresis '4mg'$ /ml Dexamethasone, and Manual therapy  PLAN FOR NEXT SESSION: STM, DN? HEP   Marcelina Morel, DPT 07/22/2022, 9:33 AM

## 2022-07-24 ENCOUNTER — Encounter: Payer: Self-pay | Admitting: Neurology

## 2022-07-25 NOTE — Telephone Encounter (Signed)
Referral was sent on 07/05/22. Guy Begin, can you contact his office today and confirm they have the referral? Dr Jaynee Eagles, any recommendation for patient in the meantime?

## 2022-07-26 ENCOUNTER — Ambulatory Visit: Payer: Medicaid Other | Admitting: Physical Therapy

## 2022-07-27 LAB — BASIC METABOLIC PANEL
BUN: 7 (ref 4–21)
BUN: 7 (ref 4–21)
CO2: 35 — AB (ref 13–22)
CO2: 35 — AB (ref 13–22)
Chloride: 99 (ref 99–108)
Chloride: 99 (ref 99–108)
Creatinine: 0.7 (ref 0.5–1.1)
Creatinine: 0.7 (ref 0.5–1.1)
Glucose: 79
Glucose: 79
Potassium: 3.8 mEq/L (ref 3.5–5.1)
Potassium: 3.8 mEq/L (ref 3.5–5.1)
Sodium: 140 (ref 137–147)
Sodium: 140 (ref 137–147)

## 2022-07-27 LAB — COMPREHENSIVE METABOLIC PANEL
Albumin: 3.9 (ref 3.5–5.0)
Albumin: 3.9 (ref 3.5–5.0)
Calcium: 9.5 (ref 8.7–10.7)
Calcium: 9.5 (ref 8.7–10.7)

## 2022-07-27 LAB — LAB REPORT - SCANNED: EGFR: 96

## 2022-07-28 ENCOUNTER — Encounter: Payer: Self-pay | Admitting: Physical Therapy

## 2022-07-28 ENCOUNTER — Ambulatory Visit: Payer: Medicaid Other | Admitting: Physical Therapy

## 2022-07-28 ENCOUNTER — Encounter: Payer: Self-pay | Admitting: Neurology

## 2022-07-28 DIAGNOSIS — M62838 Other muscle spasm: Secondary | ICD-10-CM

## 2022-07-28 DIAGNOSIS — R262 Difficulty in walking, not elsewhere classified: Secondary | ICD-10-CM

## 2022-07-28 DIAGNOSIS — M542 Cervicalgia: Secondary | ICD-10-CM

## 2022-07-28 DIAGNOSIS — R278 Other lack of coordination: Secondary | ICD-10-CM

## 2022-07-28 NOTE — Therapy (Signed)
OUTPATIENT PHYSICAL THERAPY CERVICAL EVALUATION   Patient Name: Teresa Kent MRN: [id number] DOB:1965/10/23, 57 y.o., female Today's Date: 07/28/2022  END OF SESSION:  PT End of Session - 07/28/22 0851     Visit Number 4    Date for PT Re-Evaluation 09/30/22    PT Start Time 0847    PT Stop Time 0926    PT Time Calculation (min) 39 min    Activity Tolerance Patient limited by pain    Behavior During Therapy [facility] for tasks assessed/performed             Past Medical History:  Diagnosis Date   Abnormal Pap smear 02/2003   Anemia    heavy menses   Endometriosis 02/2004   Fibroids, submucosal 02/2004   H/O: menorrhagia 02/2004   Hypertension    Hypothyroidism    Increased BMI 10/11/10   Ovarian cyst, right 03/19/2004   Simple endometrial hyperplasia 02/2004   Past Surgical History:  Procedure Laterality Date   DILATION AND CURETTAGE OF UTERUS  03/19/2004   DILITATION & CURRETTAGE/HYSTROSCOPY WITH NOVASURE ABLATION N/A 12/20/2012   Procedure: DILATATION & CURETTAGE/HYSTEROSCOPY WITH NOVASURE ABLATION;  Surgeon: Delice Lesch, MD;  Location: [facility] [facility];  Service: Gynecology;  Laterality: N/A;   OVARY SURGERY     resection of fibroid  03/19/2004   THYROID SURGERY     TUBAL LIGATION     Patient Active Problem List   Diagnosis Date Noted   Osteoarthritis of cervical spine 07/05/2022   Cervicogenic headache 07/05/2022   Chronic intractable headache 06/07/2022   Cervical stenosis of spinal canal 06/07/2022   IIH (idiopathic intracranial hypertension) 04/19/2022   Hidradenitis suppurativa 12/19/2011    PCP: Caren Macadam, MD  REFERRING PROVIDER: Dawley, Theodoro Doing, DO  REFERRING DIAG: M54.2 (ICD-10-CM) - Cervicalgia   THERAPY DIAG:  Cervicalgia  Difficulty in walking, not elsewhere classified  Other lack of coordination  Other muscle spasm  Rationale for Evaluation and Treatment: Rehabilitation  ONSET DATE: 06/27/22  SUBJECTIVE:                                                                                                                                                                                                          SUBJECTIVE STATEMENT: Patient reports some relief after last treatment, but continued pressure. Exacerbated after getting her hair down with braids.  PERTINENT HISTORY:  Per referring Physician: Patient reports Idiopathic Intracranial Hypertension and headaches, primarily on her B frontal and temporal region-pressure. Also C/O Occipital and suboccipital headaches. Treated for pseudotumor cerebral with Diamox, but  could not tolerate the medication. Denies blurred vision denies N/V, or balance issues. C/O R sided pulsatile tinnitus since Thyroid surgery. C/O intermittent numbness in R hand and calf, L toes. Occasionally drops things in R hand. 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 No cord compression or signal change  PAIN:  Are you having pain? Yes: NPRS scale: 6-7/10 Pain location: R head and neck, B thoracic spine between shoulder blades, and lower lumbar spine Pain description: shaky, inflamed, pulling, pressure in head, stiffness in shoulders Aggravating factors: Nothing in particular-maybe sitting too long Relieving factors: Prednisone helps some, Tylenol arthritis, heat Lying flat is impossible for sleeping PRECAUTIONS: None  WEIGHT BEARING RESTRICTIONS: No  FALLS:  Has patient fallen in last 6 months? 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  PLOF: Independent and Washing her hair causes severe pain  PATIENT GOALS: Relieve pressure in her head and neck, return to her previous level of activity without pain  NEXT MD VISIT: Not scheduled.  OBJECTIVE:   DIAGNOSTIC FINDINGS:  MRI 05/07/22 IMPRESSION: 1.  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 Mild-to-moderate bilateral foraminal stenosis C4-C5. A CT of the cervical spine may be useful to assess for OPLL, if clinically warranted. 2. Enlarged and partially empty sella, which is often a normal anatomic variant but can be associated with idiopathic intracranial hypertension (pseudotumor cerebri)  COGNITION: Overall cognitive status: Within functional limits for tasks assessed  SENSATION: WFL  POSTURE: rounded shoulders, forward head, increased lumbar lordosis, and anterior pelvic tilt  PALPATION: Severely tight and TTP in B upper traps, LS, TP in B up traps, L > R. B SCM very tight and TTP as well as scalenes. B cerv paraspinals also tight and TTP, R > L.  CERVICAL ROM:   Active ROM A/PROM (deg) eval  Flexion WNL  Extension WNL  Right lateral flexion 70%, feels tight  Left lateral flexion 75%, feels tight  Right rotation WNL, feels tight  Left rotation WNL, feels tight   (Blank rows = not tested)  UPPER EXTREMITY ROM: WFL, but painful in elevation and reaching to wash her hair.  UPPER EXTREMITY MMT: 5/5   CERVICAL SPECIAL TESTS:  Spurling's test: Positive and Distraction test: Positive  FUNCTIONAL TESTS:  Quick DASH test -26, 34%  TODAY'S TREATMENT:                                                                                                                              DATE:  07/28/22 NuStep L4 x 5 minutes MH and estim to cervical area x 10 minutes, with MH to low back due to reports of tightness Seated hip  stretching including forward flex over physioball, forward/diagonal stretch, figure 4, glut stretch, and HS stretch  07/22/22 NuStep L5 x 5 minutes MH to soften tissues x 5 min, cerv STM to Up Traps, LS, sub occipital muscles, with head pull Attempted  mechanical traction, but she did not tolerate, caused muscle spasm in L sub occiptial muscles. Seated self stretch and ROM to her neck in all planes Doorway stretch to chest  07/20/22 NuStep using U and LE L5 x 5 minutes STM to upper traps, cerv paraspinals, LS, scalenes and SCM, R tihgter than L, F/B stretch Supine chin tucks with shoulder  Seated stretching for Up traps, LS, cervical paraspinals  07/08/22  Education  PATIENT EDUCATION:  Education details: POC Person educated: Patient Education method: Explanation Education comprehension: verbalized understanding  HOME EXERCISE PROGRAM: TBD  ASSESSMENT:  CLINICAL IMPRESSION: Patient reports temporary relief. She noted increased LBP and stiffness, which may be contributing to overall tightness. Continued to address STM mobs and stretch throughout spine to decrease strain and pressure.  OBJECTIVE IMPAIRMENTS: decreased activity tolerance, decreased balance, decreased coordination, difficulty walking, and pain.   ACTIVITY LIMITATIONS: carrying, lifting, bending, squatting, and locomotion level  PARTICIPATION LIMITATIONS: cleaning, laundry, shopping, community activity, and occupation  PERSONAL FACTORS: Past/current experiences are also affecting patient's functional outcome.   REHAB POTENTIAL: Good  CLINICAL DECISION MAKING: Evolving/moderate complexity  EVALUATION COMPLEXITY: Moderate   GOALS: Goals reviewed with patient? Yes  SHORT TERM GOALS: Target date: 07/23/22  I with initial HEP Baseline:  Goal status: 07/20/22-provided-ongoing  LONG TERM GOALS: Target date: 09/30/22  I with final HEP Baseline:  Goal status: INITIAL  2.  Patient will recover full painfree ROM in her neck Baseline: Mildly limited, but painful Goal status: 07/22/22 ongoing  3.  Improve Quick Dash score to < 15, < 10% disability Baseline: 26, 34% disability Goal status: 07/28/22 ongoing  4.  Patient will return to sleeping in a horizontal  position Baseline: Unable to lie flat. Goal status: 07/28/22 ongoing  5.  Patient will report the ability to perform all her normal daily activities with pain < 3/10 Baseline: 7/10 Goal status: 07/28/22, ongoing  6.  Ambulate at least 20 minutes with pain < 3/10 Baseline: Limited gait due to pain Goal status: INITIAL   PLAN:  PT FREQUENCY: 1-2x/week  PT DURATION: 12 weeks  PLANNED INTERVENTIONS: Therapeutic exercises, Therapeutic activity, Neuromuscular re-education, Balance training, Gait training, Patient/Family education, Self Care, Joint mobilization, Dry Needling, Electrical stimulation, Cryotherapy, Moist heat, Taping, Traction, Ultrasound, Ionotophoresis '4mg'$ /ml Dexamethasone, and Manual therapy  PLAN FOR NEXT SESSION: STM, DN, HEP   Marcelina Morel, DPT 07/28/2022, 9:29 AM

## 2022-08-02 ENCOUNTER — Ambulatory Visit: Payer: Medicaid Other | Admitting: Physical Therapy

## 2022-08-02 ENCOUNTER — Encounter: Payer: Self-pay | Admitting: Physical Therapy

## 2022-08-02 DIAGNOSIS — R262 Difficulty in walking, not elsewhere classified: Secondary | ICD-10-CM

## 2022-08-02 DIAGNOSIS — M542 Cervicalgia: Secondary | ICD-10-CM | POA: Diagnosis not present

## 2022-08-02 DIAGNOSIS — M62838 Other muscle spasm: Secondary | ICD-10-CM

## 2022-08-02 DIAGNOSIS — R278 Other lack of coordination: Secondary | ICD-10-CM

## 2022-08-02 NOTE — Therapy (Signed)
OUTPATIENT PHYSICAL THERAPY CERVICAL EVALUATION   Patient Name: Teresa Kent MRN: [id number] DOB:[birth year], 57 y.o., female Today's Date: 08/02/2022  END OF SESSION:  PT End of Session - 08/02/22 0846     Visit Number 5    Date for PT Re-Evaluation 09/30/22    PT Start Time 0842    PT Stop Time 0928    PT Time Calculation (min) 46 min    Activity Tolerance Patient limited by pain    Behavior During Therapy [facility] for tasks assessed/performed             Past Medical History:  Diagnosis Date   Abnormal Pap smear 02/2003   Anemia    heavy menses   Endometriosis 02/2004   Fibroids, submucosal 02/2004   H/O: menorrhagia 02/2004   Hypertension    Hypothyroidism    Increased BMI 10/11/10   Ovarian cyst, right 03/19/2004   Simple endometrial hyperplasia 02/2004   Past Surgical History:  Procedure Laterality Date   DILATION AND CURETTAGE OF UTERUS  03/19/2004   DILITATION & CURRETTAGE/HYSTROSCOPY WITH NOVASURE ABLATION N/A 12/20/2012   Procedure: DILATATION & CURETTAGE/HYSTEROSCOPY WITH NOVASURE ABLATION;  Surgeon: Delice Lesch, MD;  Location: [facility];  Service: Gynecology;  Laterality: N/A;   OVARY SURGERY     resection of fibroid  03/19/2004   THYROID SURGERY     TUBAL LIGATION     Patient Active Problem List   Diagnosis Date Noted   Osteoarthritis of cervical spine 07/05/2022   Cervicogenic headache 07/05/2022   Chronic intractable headache 06/07/2022   Cervical stenosis of spinal canal 06/07/2022   IIH (idiopathic intracranial hypertension) 04/19/2022   Hidradenitis suppurativa 12/19/2011    PCP: Caren Macadam, MD  REFERRING PROVIDER: Dawley, Theodoro Doing, DO  REFERRING DIAG: M54.2 (ICD-10-CM) - Cervicalgia   THERAPY DIAG:  Cervicalgia  Difficulty in walking, not elsewhere classified  Other lack of coordination  Other muscle spasm  Rationale for Evaluation and Treatment: Rehabilitation  ONSET DATE: 06/27/22  SUBJECTIVE:                                                                                                                                                                                                          SUBJECTIVE STATEMENT: Patient reports that she felt temporary relief after her last treatment. Her lower back does feel better after performing stretches.  PERTINENT HISTORY:  Per referring Physician: Patient reports Idiopathic Intracranial Hypertension and headaches, primarily on her B frontal and temporal region-pressure. Also C/O Occipital and suboccipital headaches. Treated for pseudotumor cerebral with  Diamox, but could not tolerate the medication. Denies blurred vision denies N/V, or balance issues. C/O R sided pulsatile tinnitus since Thyroid surgery. C/O intermittent numbness in R hand and calf, L toes. Occasionally drops things in R hand. 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 No cord compression or signal change  PAIN:  Are you having pain? Yes: NPRS scale: 6-7/10 Pain location: R head and neck, B thoracic spine between shoulder blades, and lower lumbar spine Pain description: shaky, inflamed, pulling, pressure in head, stiffness in shoulders Aggravating factors: Nothing in particular-maybe sitting too long Relieving factors: Prednisone helps some, Tylenol arthritis, heat Lying flat is impossible for sleeping PRECAUTIONS: None  WEIGHT BEARING RESTRICTIONS: No  FALLS:  Has patient fallen in last 6 months? 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  PLOF: Independent and Washing her hair causes severe pain  PATIENT GOALS: Relieve pressure in her head and neck, return to her previous level of activity without pain  NEXT MD VISIT: Not scheduled.  OBJECTIVE:   DIAGNOSTIC FINDINGS:  MRI  05/07/22 IMPRESSION: 1. 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 Mild-to-moderate bilateral foraminal stenosis C4-C5. A CT of the cervical spine may be useful to assess for OPLL, if clinically warranted. 2. Enlarged and partially empty sella, which is often a normal anatomic variant but can be associated with idiopathic intracranial hypertension (pseudotumor cerebri)  COGNITION: Overall cognitive status: Within functional limits for tasks assessed  SENSATION: WFL  POSTURE: rounded shoulders, forward head, increased lumbar lordosis, and anterior pelvic tilt  PALPATION: Severely tight and TTP in B upper traps, LS, TP in B up traps, L > R. B SCM very tight and TTP as well as scalenes. B cerv paraspinals also tight and TTP, R > L.  CERVICAL ROM:   Active ROM A/PROM (deg) eval  Flexion WNL  Extension WNL  Right lateral flexion 70%, feels tight  Left lateral flexion 75%, feels tight  Right rotation WNL, feels tight  Left rotation WNL, feels tight   (Blank rows = not tested)  UPPER EXTREMITY ROM: WFL, but painful in elevation and reaching to wash her hair.  UPPER EXTREMITY MMT: 5/5   CERVICAL SPECIAL TESTS:  Spurling's test: Positive and Distraction test: Positive  FUNCTIONAL TESTS:  Quick DASH test -26, 34%  TODAY'S TREATMENT:                                                                                                                              DATE:  08/02/22 NuStep L5 x 6 minutes Seated chin tuck against ball on wall, 5 sec x 10 reps Seated trunk ext with hands on mat  behind hips, with chin tuck and shoulder depression/add, 5 sec x 10 reps. Standing shoulder ext, rows, with 5# resistance, 2 x 10 reps each, ER with G tband, 2 x 10 reps Initiated STM and stretch to upper shoulders and neck after strength  training, but patient reported increased pain, so deferred MH to neck for pain relief.  07/28/22 NuStep L4 x 5 minutes MH and estim to cervical area x 10 minutes, with MH to low back due to reports of tightness Seated hip stretching including forward flex over physioball, forward/diagonal stretch, figure 4, glut stretch, and HS stretch  07/22/22 NuStep L5 x 5 minutes MH to soften tissues x 5 min, cerv STM to Up Traps, LS, sub occipital muscles, with head pull Attempted mechanical traction, but she did not tolerate, caused muscle spasm in L sub occiptial muscles. Seated self stretch and ROM to her neck in all planes Doorway stretch to chest  07/20/22 NuStep using U and LE L5 x 5 minutes STM to upper traps, cerv paraspinals, LS, scalenes and SCM, R tihgter than L, F/B stretch Supine chin tucks with shoulder  Seated stretching for Up traps, LS, cervical paraspinals  07/08/22  Education  PATIENT EDUCATION:  Education details: POC Person educated: Patient Education method: Explanation Education comprehension: verbalized understanding  HOME EXERCISE PROGRAM: [id number]  ASSESSMENT:  CLINICAL IMPRESSION: Patient reports temporary relief after her last treatment, about an hour. She does feel some relief from LBP with stretching. Treatment today addressed postural strengthening to improve stability and decrease spasm in upper trunk, cervical muscles. HEP updated with strength exercises for posture.  OBJECTIVE IMPAIRMENTS: decreased activity tolerance, decreased balance, decreased coordination, difficulty walking, and pain.   ACTIVITY LIMITATIONS: carrying, lifting, bending, squatting, and locomotion level  PARTICIPATION LIMITATIONS: cleaning, laundry, shopping, community activity, and occupation  PERSONAL FACTORS: Past/current experiences are also affecting patient's functional outcome.   REHAB POTENTIAL: Good  CLINICAL DECISION MAKING: Evolving/moderate complexity  EVALUATION  COMPLEXITY: Moderate   GOALS: Goals reviewed with patient? Yes  SHORT TERM GOALS: Target date: 07/23/22  I with initial HEP Baseline:  Goal status: 07/20/22-provided-ongoing 08/02/22- met  LONG TERM GOALS: Target date: 09/30/22  I with final HEP Baseline:  Goal status: INITIAL  2.  Patient will recover full painfree ROM in her neck Baseline: Mildly limited, but painful Goal status: 08/02/22 ongoing  3.  Improve Quick Dash score to < 15, < 10% disability Baseline: 26, 34% disability Goal status: 07/28/22 ongoing  4.  Patient will return to sleeping in a horizontal position Baseline: Unable to lie flat. Goal status: 07/28/22 ongoing  5.  Patient will report the ability to perform all her normal daily activities with pain < 3/10 Baseline: 7/10 Goal status: 07/28/22, ongoing  6.  Ambulate at least 20 minutes with pain < 3/10 Baseline: Limited gait due to pain Goal status: INITIAL   PLAN:  PT FREQUENCY: 1-2x/week  PT DURATION: 12 weeks  PLANNED INTERVENTIONS: Therapeutic exercises, Therapeutic activity, Neuromuscular re-education, Balance training, Gait training, Patient/Family education, Self Care, Joint mobilization, Dry Needling, Electrical stimulation, Cryotherapy, Moist heat, Taping, Traction, Ultrasound, Ionotophoresis '4mg'$ /ml Dexamethasone, and Manual therapy  PLAN FOR NEXT SESSION: STM, DN, HEP-how did she tolerate the posture exercises.   Marcelina Morel, DPT 08/02/2022, 9:22 AM

## 2022-08-03 NOTE — Telephone Encounter (Signed)
Referral for Neurosurgery resent to [facility] to see Dr. Caryl Comes. Phone: [phone], Fax: [phone]

## 2022-08-04 ENCOUNTER — Ambulatory Visit: Payer: Medicaid Other | Admitting: Physical Therapy

## 2022-08-04 ENCOUNTER — Encounter: Payer: Self-pay | Admitting: Physical Therapy

## 2022-08-04 DIAGNOSIS — M542 Cervicalgia: Secondary | ICD-10-CM | POA: Diagnosis not present

## 2022-08-04 DIAGNOSIS — R278 Other lack of coordination: Secondary | ICD-10-CM

## 2022-08-04 DIAGNOSIS — R262 Difficulty in walking, not elsewhere classified: Secondary | ICD-10-CM

## 2022-08-04 DIAGNOSIS — M62838 Other muscle spasm: Secondary | ICD-10-CM

## 2022-08-04 NOTE — Therapy (Signed)
OUTPATIENT PHYSICAL THERAPY CERVICAL EVALUATION   Patient Name: Teresa Kent MRN: [id number] DOB:1966/05/22, 57 y.o., female Today's Date: 08/04/2022  END OF SESSION:  PT End of Session - 08/04/22 0843     Visit Number 6    Date for PT Re-Evaluation 09/30/22    PT Start Time 0841    PT Stop Time 0926    PT Time Calculation (min) 45 min    Activity Tolerance Patient limited by pain    Behavior During Therapy [facility] for tasks assessed/performed             Past Medical History:  Diagnosis Date   Abnormal Pap smear 02/2003   Anemia    heavy menses   Endometriosis 02/2004   Fibroids, submucosal 02/2004   H/O: menorrhagia 02/2004   Hypertension    Hypothyroidism    Increased BMI 10/11/10   Ovarian cyst, right 03/19/2004   Simple endometrial hyperplasia 02/2004   Past Surgical History:  Procedure Laterality Date   DILATION AND CURETTAGE OF UTERUS  03/19/2004   DILITATION & CURRETTAGE/HYSTROSCOPY WITH NOVASURE ABLATION N/A 12/20/2012   Procedure: DILATATION & CURETTAGE/HYSTEROSCOPY WITH NOVASURE ABLATION;  Surgeon: Delice Lesch, MD;  Location: [facility];  Service: Gynecology;  Laterality: N/A;   OVARY SURGERY     resection of fibroid  03/19/2004   THYROID SURGERY     TUBAL LIGATION     Patient Active Problem List   Diagnosis Date Noted   Osteoarthritis of cervical spine 07/05/2022   Cervicogenic headache 07/05/2022   Chronic intractable headache 06/07/2022   Cervical stenosis of spinal canal 06/07/2022   IIH (idiopathic intracranial hypertension) 04/19/2022   Hidradenitis suppurativa 12/19/2011    PCP: Caren Macadam, MD  REFERRING PROVIDER: Dawley, Theodoro Doing, DO  REFERRING DIAG: M54.2 (ICD-10-CM) - Cervicalgia   THERAPY DIAG:  Cervicalgia  Difficulty in walking, not elsewhere classified  Other lack of coordination  Other muscle spasm  Rationale for Evaluation and Treatment: Rehabilitation  ONSET DATE: 06/27/22  SUBJECTIVE:                                                                                                                                                                                                          SUBJECTIVE STATEMENT: Patient reports new onset of dizziness, started yesterday. She noted it when turning her head.  PERTINENT HISTORY:  Per referring Physician: Patient reports Idiopathic Intracranial Hypertension and headaches, primarily on her B frontal and temporal region-pressure. Also C/O Occipital and suboccipital headaches. Treated for pseudotumor cerebral with Diamox, but could not tolerate  the medication. Denies blurred vision denies N/V, or balance issues. C/O R sided pulsatile tinnitus since Thyroid surgery. C/O intermittent numbness in R hand and calf, L toes. Occasionally drops things in R hand. 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 No cord compression or signal change  PAIN:  Are you having pain? Yes: NPRS scale: 6-7/10 Pain location: R head and neck, B thoracic spine between shoulder blades, and lower lumbar spine Pain description: shaky, inflamed, pulling, pressure in head, stiffness in shoulders Aggravating factors: Nothing in particular-maybe sitting too long Relieving factors: Prednisone helps some, Tylenol arthritis, heat Lying flat is impossible for sleeping PRECAUTIONS: None  WEIGHT BEARING RESTRICTIONS: No  FALLS:  Has patient fallen in last 6 months? 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  PLOF: Independent and Washing her hair causes severe pain  PATIENT GOALS: Relieve pressure in her head and neck, return to her previous level of activity without pain  NEXT MD VISIT: Not scheduled.  OBJECTIVE:   DIAGNOSTIC FINDINGS:  MRI 05/07/22 IMPRESSION: 1. 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 Mild-to-moderate bilateral foraminal stenosis C4-C5. A CT of the cervical spine may be useful to assess for OPLL, if clinically warranted. 2. Enlarged and partially empty sella, which is often a normal anatomic variant but can be associated with idiopathic intracranial hypertension (pseudotumor cerebri)  COGNITION: Overall cognitive status: Within functional limits for tasks assessed  SENSATION: WFL  POSTURE: rounded shoulders, forward head, increased lumbar lordosis, and anterior pelvic tilt  PALPATION: Severely tight and TTP in B upper traps, LS, TP in B up traps, L > R. B SCM very tight and TTP as well as scalenes. B cerv paraspinals also tight and TTP, R > L.  CERVICAL ROM:   Active ROM A/PROM (deg) eval  Flexion WNL  Extension WNL  Right lateral flexion 70%, feels tight  Left lateral flexion 75%, feels tight  Right rotation WNL, feels tight  Left rotation WNL, feels tight   (Blank rows = not tested)  UPPER EXTREMITY ROM: WFL, but painful in elevation and reaching to wash her hair.  UPPER EXTREMITY MMT: 5/5   CERVICAL SPECIAL TESTS:  Spurling's test: Positive and Distraction test: Positive  FUNCTIONAL TESTS:  Quick DASH test -26, 34%  TODAY'S TREATMENT:                                                                                                                              DATE:  08/04/22 DN by Roma Schanz, PT to R rhomboids and upper trap Seated stretch for R up traps and rhomboids Ocular-motor assessment Saccades- delayed after 10 reps, noted "black lines  and dots" in her vision Nystagmus- none noted Ocular ROM WNL VOR irritated neck, so stopped Horizontal VOR cancellation triggered pressure and dizziness, resolved when activity stopped. Vertical testing deferred due to her neck  pain    08/02/22 NuStep L5 x 6 minutes Seated chin tuck against ball on wall, 5 sec x 10 reps Seated trunk ext with hands on mat behind hips, with chin tuck and shoulder depression/add, 5 sec x 10 reps. Standing shoulder ext, rows, with 5# resistance, 2 x 10 reps each, ER with G tband, 2 x 10 reps Initiated STM and stretch to upper shoulders and neck after strength training, but patient reported increased pain, so deferred MH to neck for pain relief.  07/28/22 NuStep L4 x 5 minutes MH and estim to cervical area x 10 minutes, with MH to low back due to reports of tightness Seated hip stretching including forward flex over physioball, forward/diagonal stretch, figure 4, glut stretch, and HS stretch  07/22/22 NuStep L5 x 5 minutes MH to soften tissues x 5 min, cerv STM to Up Traps, LS, sub occipital muscles, with head pull Attempted mechanical traction, but she did not tolerate, caused muscle spasm in L sub occiptial muscles. Seated self stretch and ROM to her neck in all planes Doorway stretch to chest  07/20/22 NuStep using U and LE L5 x 5 minutes STM to upper traps, cerv paraspinals, LS, scalenes and SCM, R tihgter than L, F/B stretch Supine chin tucks with shoulder  Seated stretching for Up traps, LS, cervical paraspinals  07/08/22  Education  PATIENT EDUCATION:  Education details: POC Person educated: Patient Education method: Explanation Education comprehension: verbalized understanding  HOME EXERCISE PROGRAM: [id number]  ASSESSMENT:  CLINICAL IMPRESSION: Patient reports overall improvement noted when she realized that she no longer had muscle spasms and her pressure in her head has improved. Initated DN, which seems to have further relieved pressure. Also initated vestibular assessment as she reports new onset of dizziness. VOR triggered dizziness and pressure, updated Hep for rhomboid stretch and VOR cancellation.  OBJECTIVE IMPAIRMENTS: decreased activity tolerance,  decreased balance, decreased coordination, difficulty walking, and pain.   ACTIVITY LIMITATIONS: carrying, lifting, bending, squatting, and locomotion level  PARTICIPATION LIMITATIONS: cleaning, laundry, shopping, community activity, and occupation  PERSONAL FACTORS: Past/current experiences are also affecting patient's functional outcome.   REHAB POTENTIAL: Good  CLINICAL DECISION MAKING: Evolving/moderate complexity  EVALUATION COMPLEXITY: Moderate   GOALS: Goals reviewed with patient? Yes  SHORT TERM GOALS: Target date: 07/23/22  I with initial HEP Baseline:  Goal status: 07/20/22-provided-ongoing 08/02/22- met  LONG TERM GOALS: Target date: 09/30/22  I with final HEP Baseline:  Goal status: INITIAL  2.  Patient will recover full painfree ROM in her neck Baseline: Mildly limited, but painful Goal status: 08/02/22 ongoing  3.  Improve Quick Dash score to < 15, < 10% disability Baseline: 26, 34% disability Goal status: 07/28/22 ongoing  4.  Patient will return to sleeping in a horizontal position Baseline: Unable to lie flat. Goal status: 07/28/22 ongoing  5.  Patient will report the ability to perform all her normal daily activities with pain < 3/10 Baseline: 7/10 Goal status: 07/28/22, ongoing  6.  Ambulate at least 20 minutes with pain < 3/10 Baseline: Limited gait due to pain Goal status: INITIAL   PLAN:  PT FREQUENCY: 1-2x/week  PT DURATION: 12 weeks  PLANNED INTERVENTIONS: Therapeutic exercises, Therapeutic activity, Neuromuscular re-education, Balance training, Gait training, Patient/Family education, Self Care, Joint mobilization, Dry Needling, Electrical stimulation,  Cryotherapy, Moist heat, Taping, Traction, Ultrasound, Ionotophoresis '4mg'$ /ml Dexamethasone, and Manual therapy  PLAN FOR NEXT SESSION: STM, DN, HEP-how did she tolerate the DN and vest exer.   Marcelina Morel, DPT 08/04/2022, 9:28 AM

## 2022-08-08 ENCOUNTER — Ambulatory Visit: Payer: No Typology Code available for payment source | Admitting: Cardiology

## 2022-08-09 ENCOUNTER — Telehealth: Payer: Self-pay | Admitting: Neurology

## 2022-08-09 ENCOUNTER — Ambulatory Visit: Payer: Medicaid Other | Admitting: Physical Therapy

## 2022-08-09 ENCOUNTER — Encounter: Payer: Self-pay | Admitting: Physical Therapy

## 2022-08-09 DIAGNOSIS — R278 Other lack of coordination: Secondary | ICD-10-CM

## 2022-08-09 DIAGNOSIS — M542 Cervicalgia: Secondary | ICD-10-CM

## 2022-08-09 DIAGNOSIS — M62838 Other muscle spasm: Secondary | ICD-10-CM

## 2022-08-09 DIAGNOSIS — R262 Difficulty in walking, not elsewhere classified: Secondary | ICD-10-CM

## 2022-08-09 NOTE — Therapy (Signed)
OUTPATIENT PHYSICAL THERAPY CERVICAL EVALUATION   Patient Name: Teresa Kent MRN: [id number] DOB:September 13, 1965, 57 y.o., female Today's Date: 08/09/2022  END OF SESSION:  PT End of Session - 08/09/22 0847     Visit Number 7    Date for PT Re-Evaluation 09/30/22    PT Start Time 0845    PT Stop Time 0925    PT Time Calculation (min) 40 min    Activity Tolerance Patient limited by pain    Behavior During Therapy [facility] for tasks assessed/performed             Past Medical History:  Diagnosis Date   Abnormal Pap smear 02/2003   Anemia    heavy menses   Endometriosis 02/2004   Fibroids, submucosal 02/2004   H/O: menorrhagia 02/2004   Hypertension    Hypothyroidism    Increased BMI 10/11/10   Ovarian cyst, right 03/19/2004   Simple endometrial hyperplasia 02/2004   Past Surgical History:  Procedure Laterality Date   DILATION AND CURETTAGE OF UTERUS  03/19/2004   DILITATION & CURRETTAGE/HYSTROSCOPY WITH NOVASURE ABLATION N/A 12/20/2012   Procedure: DILATATION & CURETTAGE/HYSTEROSCOPY WITH NOVASURE ABLATION;  Surgeon: Delice Lesch, MD;  Location: [facility];  Service: Gynecology;  Laterality: N/A;   OVARY SURGERY     resection of fibroid  03/19/2004   THYROID SURGERY     TUBAL LIGATION     Patient Active Problem List   Diagnosis Date Noted   Osteoarthritis of cervical spine 07/05/2022   Cervicogenic headache 07/05/2022   Chronic intractable headache 06/07/2022   Cervical stenosis of spinal canal 06/07/2022   IIH (idiopathic intracranial hypertension) 04/19/2022   Hidradenitis suppurativa 12/19/2011    PCP: Caren Macadam, MD  REFERRING PROVIDER: Dawley, Theodoro Doing, DO  REFERRING DIAG: M54.2 (ICD-10-CM) - Cervicalgia   THERAPY DIAG:  Cervicalgia  Difficulty in walking, not elsewhere classified  Other lack of coordination  Other muscle spasm  Rationale for Evaluation and Treatment: Rehabilitation  ONSET DATE: 06/27/22  SUBJECTIVE:                                                                                                                                                                                                          SUBJECTIVE STATEMENT: Patient reports that the DN did provide some relief, but otherwise, she is about the same.  PERTINENT HISTORY:  Per referring Physician: Patient reports Idiopathic Intracranial Hypertension and headaches, primarily on her B frontal and temporal region-pressure. Also C/O Occipital and suboccipital headaches. Treated for pseudotumor cerebral with Diamox, but could not  tolerate the medication. Denies blurred vision denies N/V, or balance issues. C/O R sided pulsatile tinnitus since Thyroid surgery. C/O intermittent numbness in R hand and calf, L toes. Occasionally drops things in R hand. 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 No cord compression or signal change  PAIN:  Are you having pain? Yes: NPRS scale: 6-7/10 Pain location: R head and neck, B thoracic spine between shoulder blades, and lower lumbar spine Pain description: shaky, inflamed, pulling, pressure in head, stiffness in shoulders Aggravating factors: Nothing in particular-maybe sitting too long Relieving factors: Prednisone helps some, Tylenol arthritis, heat Lying flat is impossible for sleeping PRECAUTIONS: None  WEIGHT BEARING RESTRICTIONS: No  FALLS:  Has patient fallen in last 6 months? 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  PLOF: Independent and Washing her hair causes severe pain  PATIENT GOALS: Relieve pressure in her head and neck, return to her previous level of activity without pain  NEXT MD VISIT: Not scheduled.  OBJECTIVE:   DIAGNOSTIC FINDINGS:  MRI 05/07/22 IMPRESSION: 1. 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 Mild-to-moderate bilateral foraminal stenosis C4-C5. A CT of the cervical spine may be useful to assess for OPLL, if clinically warranted. 2. Enlarged and partially empty sella, which is often a normal anatomic variant but can be associated with idiopathic intracranial hypertension (pseudotumor cerebri)  COGNITION: Overall cognitive status: Within functional limits for tasks assessed  SENSATION: WFL  POSTURE: rounded shoulders, forward head, increased lumbar lordosis, and anterior pelvic tilt  PALPATION: Severely tight and TTP in B upper traps, LS, TP in B up traps, L > R. B SCM very tight and TTP as well as scalenes. B cerv paraspinals also tight and TTP, R > L.  CERVICAL ROM:   Active ROM A/PROM (deg) eval  Flexion WNL  Extension WNL  Right lateral flexion 70%, feels tight  Left lateral flexion 75%, feels tight  Right rotation WNL, feels tight  Left rotation WNL, feels tight   (Blank rows = not tested)  UPPER EXTREMITY ROM: WFL, but painful in elevation and reaching to wash her hair.  UPPER EXTREMITY MMT: 5/5   CERVICAL SPECIAL TESTS:  Spurling's test: Positive and Distraction test: Positive  FUNCTIONAL TESTS:  Quick DASH test -26, 34%  TODAY'S TREATMENT:                                                                                                                              DATE:  08/09/22 NuStep L4 x 6 minutes DN by Roma Schanz PT for L up traps Cervical stretching- lower traps, upper traps, neck flexion Supine pelvic tilts x 10, SKTC,  DKTC,  Seated figure 4, glut stretch, HS stretch Forward flexion stretch in sitting Palo press, 5#, 2 x 10 in each direction with min VC for posture.  08/04/22 DN by Roma Schanz, PT to R rhomboids and upper trap Seated stretch for R up traps and  rhomboids Ocular-motor assessment Saccades- delayed after 10 reps, noted "black lines and dots" in her vision Nystagmus- none noted Ocular ROM WNL VOR irritated neck, so stopped Horizontal VOR cancellation triggered pressure and dizziness, resolved when activity stopped. Vertical testing deferred due to her neck pain  08/02/22 NuStep L5 x 6 minutes Seated chin tuck against ball on wall, 5 sec x 10 reps Seated trunk ext with hands on mat behind hips, with chin tuck and shoulder depression/add, 5 sec x 10 reps. Standing shoulder ext, rows, with 5# resistance, 2 x 10 reps each, ER with G tband, 2 x 10 reps Initiated STM and stretch to upper shoulders and neck after strength training, but patient reported increased pain, so deferred MH to neck for pain relief.  07/28/22 NuStep L4 x 5 minutes MH and estim to cervical area x 10 minutes, with MH to low back due to reports of tightness Seated hip stretching including forward flex over physioball, forward/diagonal stretch, figure 4, glut stretch, and HS stretch  07/22/22 NuStep L5 x 5 minutes MH to soften tissues x 5 min, cerv STM to Up Traps, LS, sub occipital muscles, with head pull Attempted mechanical traction, but she did not tolerate, caused muscle spasm in L sub occiptial muscles. Seated self stretch and ROM to her neck in all planes Doorway stretch to chest  07/20/22 NuStep using U and LE L5 x 5 minutes STM to upper traps, cerv paraspinals, LS, scalenes and SCM, R tihgter than L, F/B stretch Supine chin tucks with shoulder  Seated stretching for Up traps, LS, cervical paraspinals  07/08/22  Education  PATIENT EDUCATION:  Education details: POC Person educated: Patient Education method: Explanation Education comprehension: verbalized understanding  HOME EXERCISE PROGRAM: [id number]  ASSESSMENT:  CLINICAL IMPRESSION: Patient reports the DN did seem to help her. Repeated DN, then added stetch for upper and lower back as she  reports continued LBP. Completed treatment with some strengthening for entire back and neck with Palof press. Tolerated well.  OBJECTIVE IMPAIRMENTS: decreased activity tolerance, decreased balance, decreased coordination, difficulty walking, and pain.   ACTIVITY LIMITATIONS: carrying, lifting, bending, squatting, and locomotion level  PARTICIPATION LIMITATIONS: cleaning, laundry, shopping, community activity, and occupation  PERSONAL FACTORS: Past/current experiences are also affecting patient's functional outcome.   REHAB POTENTIAL: Good  CLINICAL DECISION MAKING: Evolving/moderate complexity  EVALUATION COMPLEXITY: Moderate   GOALS: Goals reviewed with patient? Yes  SHORT TERM GOALS: Target date: 07/23/22  I with initial HEP Baseline:  Goal status: 07/20/22-provided-ongoing 08/02/22- met  LONG TERM GOALS: Target date: 09/30/22  I with final HEP Baseline:  Goal status: INITIAL  2.  Patient will recover full painfree ROM in her neck Baseline: Mildly limited, but painful Goal status: 08/02/22 ongoing  3.  Improve Quick Dash score to < 15, < 10% disability Baseline: 26, 34% disability Goal status: 07/28/22 ongoing  4.  Patient will return to sleeping in a horizontal position Baseline: Unable to lie flat. Goal status: 08/09/22 ongoing  5.  Patient will report the ability to perform all her normal daily activities with pain < 3/10 Baseline: 7/10 Goal status: 07/28/22, ongoing  6.  Ambulate at least 20 minutes with pain < 3/10 Baseline: Limited gait due  to pain Goal status: INITIAL   PLAN:  PT FREQUENCY: 1-2x/week  PT DURATION: 12 weeks  PLANNED INTERVENTIONS: Therapeutic exercises, Therapeutic activity, Neuromuscular re-education, Balance training, Gait training, Patient/Family education, Self Care, Joint mobilization, Dry Needling, Electrical stimulation, Cryotherapy, Moist heat, Taping, Traction, Ultrasound, Ionotophoresis 4mg /ml Dexamethasone, and Manual  therapy  PLAN FOR NEXT SESSION: STM, DN, Add PPT to HEP-update strengthening.   Marcelina Morel, DPT 08/09/2022, 9:30 AM

## 2022-08-09 NOTE — Telephone Encounter (Signed)
[employer] med[employer] Josem KaufmannSC:[id number] exp. 08/09/2022 - 10/08/2022 for GI

## 2022-08-11 ENCOUNTER — Encounter: Payer: Self-pay | Admitting: Physical Therapy

## 2022-08-11 ENCOUNTER — Ambulatory Visit: Payer: Medicaid Other | Admitting: Physical Therapy

## 2022-08-11 DIAGNOSIS — M542 Cervicalgia: Secondary | ICD-10-CM

## 2022-08-11 DIAGNOSIS — M62838 Other muscle spasm: Secondary | ICD-10-CM

## 2022-08-11 DIAGNOSIS — R278 Other lack of coordination: Secondary | ICD-10-CM

## 2022-08-11 DIAGNOSIS — R262 Difficulty in walking, not elsewhere classified: Secondary | ICD-10-CM

## 2022-08-11 NOTE — Therapy (Signed)
OUTPATIENT PHYSICAL THERAPY CERVICAL EVALUATION   Patient Name: ROMUNDA SCHREIB MRN: [id number] DOB:03/18/1966, 57 y.o., female Today's Date: 08/11/2022  END OF SESSION:  PT End of Session - 08/11/22 0853     Visit Number 8    Date for PT Re-Evaluation 09/30/22    PT Start Time 0847    PT Stop Time 0926    PT Time Calculation (min) 39 min    Activity Tolerance Patient limited by pain    Behavior During Therapy [facility] for tasks assessed/performed             Past Medical History:  Diagnosis Date   Abnormal Pap smear 02/2003   Anemia    heavy menses   Endometriosis 02/2004   Fibroids, submucosal 02/2004   H/O: menorrhagia 02/2004   Hypertension    Hypothyroidism    Increased BMI 10/11/10   Ovarian cyst, right 03/19/2004   Simple endometrial hyperplasia 02/2004   Past Surgical History:  Procedure Laterality Date   DILATION AND CURETTAGE OF UTERUS  03/19/2004   DILITATION & CURRETTAGE/HYSTROSCOPY WITH NOVASURE ABLATION N/A 12/20/2012   Procedure: DILATATION & CURETTAGE/HYSTEROSCOPY WITH NOVASURE ABLATION;  Surgeon: Delice Lesch, MD;  Location: [facility];  Service: Gynecology;  Laterality: N/A;   OVARY SURGERY     resection of fibroid  03/19/2004   THYROID SURGERY     TUBAL LIGATION     Patient Active Problem List   Diagnosis Date Noted   Osteoarthritis of cervical spine 07/05/2022   Cervicogenic headache 07/05/2022   Chronic intractable headache 06/07/2022   Cervical stenosis of spinal canal 06/07/2022   IIH (idiopathic intracranial hypertension) 04/19/2022   Hidradenitis suppurativa 12/19/2011    PCP: Caren Macadam, MD  REFERRING PROVIDER: Dawley, Theodoro Doing, DO  REFERRING DIAG: M54.2 (ICD-10-CM) - Cervicalgia   THERAPY DIAG:  Cervicalgia  Difficulty in walking, not elsewhere classified  Other lack of coordination  Other muscle spasm  Rationale for Evaluation and Treatment: Rehabilitation  ONSET DATE: 06/27/22  SUBJECTIVE:                                                                                                                                                                                                          SUBJECTIVE STATEMENT: Patient reports that the DN did provide some relief, but otherwise, she is about the same.  PERTINENT HISTORY:  Per referring Physician: Patient reports Idiopathic Intracranial Hypertension and headaches, primarily on her B frontal and temporal region-pressure. Also C/O Occipital and suboccipital headaches. Treated for pseudotumor cerebral with Diamox, but could not  tolerate the medication. Denies blurred vision denies N/V, or balance issues. C/O R sided pulsatile tinnitus since Thyroid surgery. C/O intermittent numbness in R hand and calf, L toes. Occasionally drops things in R hand. 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 No cord compression or signal change  PAIN:  Are you having pain? Yes: NPRS scale: 6-7/10 Pain location: R head and neck, B thoracic spine between shoulder blades, and lower lumbar spine Pain description: shaky, inflamed, pulling, pressure in head, stiffness in shoulders Aggravating factors: Nothing in particular-maybe sitting too long Relieving factors: Prednisone helps some, Tylenol arthritis, heat Lying flat is impossible for sleeping PRECAUTIONS: None  WEIGHT BEARING RESTRICTIONS: No  FALLS:  Has patient fallen in last 6 months? 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  PLOF: Independent and Washing her hair causes severe pain  PATIENT GOALS: Relieve pressure in her head and neck, return to her previous level of activity without pain  NEXT MD VISIT: Not scheduled.  OBJECTIVE:   DIAGNOSTIC FINDINGS:  MRI 05/07/22 IMPRESSION: 1. 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 Mild-to-moderate bilateral foraminal stenosis C4-C5. A CT of the cervical spine may be useful to assess for OPLL, if clinically warranted. 2. Enlarged and partially empty sella, which is often a normal anatomic variant but can be associated with idiopathic intracranial hypertension (pseudotumor cerebri)  COGNITION: Overall cognitive status: Within functional limits for tasks assessed  SENSATION: WFL  POSTURE: rounded shoulders, forward head, increased lumbar lordosis, and anterior pelvic tilt  PALPATION: Severely tight and TTP in B upper traps, LS, TP in B up traps, L > R. B SCM very tight and TTP as well as scalenes. B cerv paraspinals also tight and TTP, R > L.  CERVICAL ROM:   Active ROM A/PROM (deg) eval  Flexion WNL  Extension WNL  Right lateral flexion 70%, feels tight  Left lateral flexion 75%, feels tight  Right rotation WNL, feels tight  Left rotation WNL, feels tight   (Blank rows = not tested)  UPPER EXTREMITY ROM: WFL, but painful in elevation and reaching to wash her hair.  UPPER EXTREMITY MMT: 5/5   CERVICAL SPECIAL TESTS:  Spurling's test: Positive and Distraction test: Positive  FUNCTIONAL TESTS:  Quick DASH test -26, 34%  TODAY'S TREATMENT:                                                                                                                              DATE:  08/11/22 UBE L1 x 3 min forward and back STM and stretch-B up traps, rhomboids Standing with back to wall- Red Tband shoulder horizontal abd, diagonal reach up to  each side, 10 each Facing wall, 1# weights, lift off wall x 10 Standing shoulder ext, rows, ER, 2 x 10 reps each, Red Tband. Ball against wall in diamond pattern 5 x each direction. MH to neck and upper shoulders for pain relief.  08/09/22 NuStep L4 x 6  minutes DN by Roma Schanz PT for L up traps Cervical stretching- lower traps, upper traps, neck flexion Supine pelvic tilts x 10, SKTC, DKTC,  Seated figure 4, glut stretch, HS stretch Forward flexion stretch in sitting Palof press, 5#, 2 x 10 in each direction with min VC for posture.  08/04/22 DN by Roma Schanz, PT to R rhomboids and upper trap Seated stretch for R up traps and rhomboids Ocular-motor assessment Saccades- delayed after 10 reps, noted "black lines and dots" in her vision Nystagmus- none noted Ocular ROM WNL VOR irritated neck, so stopped Horizontal VOR cancellation triggered pressure and dizziness, resolved when activity stopped. Vertical testing deferred due to her neck pain  08/02/22 NuStep L5 x 6 minutes Seated chin tuck against ball on wall, 5 sec x 10 reps Seated trunk ext with hands on mat behind hips, with chin tuck and shoulder depression/add, 5 sec x 10 reps. Standing shoulder ext, rows, with 5# resistance, 2 x 10 reps each, ER with G tband, 2 x 10 reps Initiated STM and stretch to upper shoulders and neck after strength training, but patient reported increased pain, so deferred MH to neck for pain relief.  07/28/22 NuStep L4 x 5 minutes MH and estim to cervical area x 10 minutes, with MH to low back due to reports of tightness Seated hip stretching including forward flex over physioball, forward/diagonal stretch, figure 4, glut stretch, and HS stretch  07/22/22 NuStep L5 x 5 minutes MH to soften tissues x 5 min, cerv STM to Up Traps, LS, sub occipital muscles, with head pull Attempted mechanical traction, but she did not tolerate, caused muscle spasm in L sub occiptial muscles. Seated self stretch and ROM to her neck in all planes Doorway stretch to chest  07/20/22 NuStep using U and LE L5 x 5 minutes STM to upper traps, cerv paraspinals, LS, scalenes and SCM, R tihgter than L, F/B stretch Supine chin tucks with shoulder  Seated stretching for Up  traps, LS, cervical paraspinals  07/08/22  Education  PATIENT EDUCATION:  Education details: POC Person educated: Patient Education method: Explanation Education comprehension: verbalized understanding  HOME EXERCISE PROGRAM: [id number]  ASSESSMENT:  CLINICAL IMPRESSION: Patient reports no real changes, but her lower back feels better. She did not tolerate STM well, but did feel some relief with scapular mobs into depression. Then moved to increased strength training for scapular mobility with multiple exercises in different planes of movement. Finished with MH for pain relief.  OBJECTIVE IMPAIRMENTS: decreased activity tolerance, decreased balance, decreased coordination, difficulty walking, and pain.   ACTIVITY LIMITATIONS: carrying, lifting, bending, squatting, and locomotion level  PARTICIPATION LIMITATIONS: cleaning, laundry, shopping, community activity, and occupation  PERSONAL FACTORS: Past/current experiences are also affecting patient's functional outcome.   REHAB POTENTIAL: Good  CLINICAL DECISION MAKING: Evolving/moderate complexity  EVALUATION COMPLEXITY: Moderate   GOALS: Goals reviewed with patient? Yes  SHORT TERM GOALS: Target date: 07/23/22  I with initial HEP Baseline:  Goal status: 07/20/22-provided-ongoing 08/02/22- met  LONG TERM GOALS: Target date: 09/30/22  I with final HEP Baseline:  Goal status: INITIAL  2.  Patient will recover full painfree ROM in her neck Baseline: Mildly limited, but painful Goal status:  08/02/22 ongoing  3.  Improve Quick Dash score to < 15, < 10% disability Baseline: 26, 34% disability Goal status: 07/28/22 ongoing  4.  Patient will return to sleeping in a horizontal position Baseline: Unable to lie flat. Goal status: 08/09/22 ongoing  5.  Patient will report the ability to perform all her normal daily activities with pain < 3/10 Baseline: 7/10 Goal status: 07/28/22, ongoing  6.  Ambulate at least 20 minutes with  pain < 3/10 Baseline: Limited gait due to pain Goal status: 08/11/22-ongoing   PLAN:  PT FREQUENCY: 1-2x/week  PT DURATION: 12 weeks  PLANNED INTERVENTIONS: Therapeutic exercises, Therapeutic activity, Neuromuscular re-education, Balance training, Gait training, Patient/Family education, Self Care, Joint mobilization, Dry Needling, Electrical stimulation, Cryotherapy, Moist heat, Taping, Traction, Ultrasound, Ionotophoresis 4mg /ml Dexamethasone, and Manual therapy  PLAN FOR NEXT SESSION: STM, DN, Add PPT to HEP-update strengthening.   Marcelina Morel, DPT 08/11/2022, 9:24 AM

## 2022-08-12 ENCOUNTER — Encounter: Payer: Self-pay | Admitting: Nurse Practitioner

## 2022-08-12 ENCOUNTER — Ambulatory Visit (INDEPENDENT_AMBULATORY_CARE_PROVIDER_SITE_OTHER): Payer: Medicaid Other | Admitting: Nurse Practitioner

## 2022-08-12 VITALS — BP 126/80 | HR 63 | Temp 97.1°F | Ht 71.0 in | Wt 215.0 lb

## 2022-08-12 DIAGNOSIS — E890 Postprocedural hypothyroidism: Secondary | ICD-10-CM

## 2022-08-12 DIAGNOSIS — G8929 Other chronic pain: Secondary | ICD-10-CM

## 2022-08-12 DIAGNOSIS — R519 Headache, unspecified: Secondary | ICD-10-CM

## 2022-08-12 DIAGNOSIS — I10 Essential (primary) hypertension: Secondary | ICD-10-CM | POA: Diagnosis not present

## 2022-08-12 DIAGNOSIS — K5904 Chronic idiopathic constipation: Secondary | ICD-10-CM | POA: Diagnosis not present

## 2022-08-12 DIAGNOSIS — E782 Mixed hyperlipidemia: Secondary | ICD-10-CM | POA: Diagnosis not present

## 2022-08-12 DIAGNOSIS — R0609 Other forms of dyspnea: Secondary | ICD-10-CM

## 2022-08-12 NOTE — Patient Instructions (Addendum)
Please sign a record release for prior PCP, GYN, GI on check out.

## 2022-08-12 NOTE — Progress Notes (Signed)
Careteam: Patient Care Team: Lauree Chandler, NP as PCP - General (Geriatric Medicine) Berniece Salines, DO as PCP - Cardiology (Cardiology) Otis Brace, MD as Consulting Physician (Gastroenterology) Everett Graff, MD as Consulting Physician (Obstetrics and Gynecology) Berniece Salines, DO as Consulting Physician (Cardiology) Melvenia Beam, MD as Consulting Physician (Neurology) Juanito Doom, MD as Consulting Physician (Pulmonary Disease)  PLACE OF SERVICE:  [place] Directive information Does Patient Have a Medical Advance Directive?: No, Would patient like information on creating a medical advance directive?: No - Patient declined  Allergies  Allergen Reactions   Prilosec [Omeprazole]    Simvastatin     Chief Complaint  Patient presents with   Establish Care    New patient to establish care. Pill bottles present at initial appointment. Patient would like to discuss overall health, inflammation, and getting a full blood panel.       HPI: Patient is a 57 y.o. female to establish care.  She had her last physical in February by prior PCP.  Pt with hx of htn, hyperlipidemia, endometriosis with fibroids, chronic headaches  OA of neck and pain- has pain- take tylenol PRN, has Rx for flexeril but did not take.   Hyperlipidemia- has rx for zetia but has not started yet.   GERD- not having any GERD,   Hypothyroid, surgery removed due to goiter-  has been on synthroid 150 mcg for years.   Takes claritin due to congestion   Htn- on triamterene- hctz daily for htn  Seeing neurosurgery due to a constant squeezing and pressure to her head.  No one has found any answers at this time.  She was referred to rheumatology due to elevated sed rate and crp in February. She was treated with prednisone and recommended to get temporal artery biopsy- unsure if this was done.   She was having shortness of breath- went to pulmonary because she can not breath through  her nose- having to breath through her mouth- all since COVID in January.  She has PFT and CT of the lungs scheduled for April.   Has neurologist, pulmonologist, cardiologist- all through [facility]   Seeing PT for her cervical headaches.   Review of Systems:  Review of Systems  Constitutional:  Negative for chills, fever and weight loss.  HENT:  Negative for tinnitus.   Respiratory:  Negative for cough, sputum production and shortness of breath.   Cardiovascular:  Negative for chest pain, palpitations and leg swelling.  Gastrointestinal:  Positive for constipation. Negative for abdominal pain, diarrhea and heartburn.  Genitourinary:  Negative for dysuria, frequency and urgency.  Musculoskeletal:  Positive for back pain and myalgias. Negative for falls and joint pain.  Skin: Negative.   Neurological:  Positive for dizziness. Negative for headaches.  Psychiatric/Behavioral:  Negative for depression and memory loss. The patient does not have insomnia.     Past Medical History:  Diagnosis Date   Abnormal Pap smear 02/21/2003   Anemia    heavy menses   Arthritis    Per [facility] New Patient Packet   Cervicogenic headache    Per [place] New Patient Packet   Endometriosis 02/21/2004   Fibroids, submucosal 02/21/2004   H/O: menorrhagia 02/21/2004   High cholesterol    Per [place] New Patient Packet   Hx of mammogram 2024   Per [place] New Patient Packet   Hypertension    Hypothyroidism    Increased BMI 10/11/2010   Ovarian cyst, right 03/19/2004   Simple  endometrial hyperplasia 02/21/2004   Past Surgical History:  Procedure Laterality Date   DILATION AND CURETTAGE OF UTERUS  03/19/2004   DILITATION & CURRETTAGE/HYSTROSCOPY WITH NOVASURE ABLATION N/A 12/20/2012   Procedure: DILATATION & CURETTAGE/HYSTEROSCOPY WITH NOVASURE ABLATION;  Surgeon: Delice Lesch, MD;  Location: [facility];  Service: Gynecology;  Laterality: N/A;   OVARY SURGERY     resection of fibroid  03/19/2004   THYROID SURGERY      TUBAL LIGATION     Social History:   reports that she has never smoked. She has never used smokeless tobacco. She reports that she does not drink alcohol and does not use drugs.  Family History  Problem Relation Age of Onset   Hypotension Mother    High blood pressure Mother        Per [facility] New Patient Packet   High Cholesterol Mother        Per [place] Patient Packet   Arthritis Mother        Per [place] Patient Packet   High blood pressure Brother        Per [place] New Patient Packet   High Cholesterol Brother        Per [facility] New Patient Packet   Breast cancer Maternal Grandmother    Diabetes Maternal Grandmother    Stroke Maternal Grandmother        Per [place] New Patient Packet   Arthritis Maternal Grandmother        Per [place] New Patient Packet   High blood pressure Son        Per [facility] New Patient Packet   High Cholesterol Son        Per [facility] New Patient Packet   Thyroid disease Son        Per [facility] New Patient Packet   Mood Disorder Son        Per [facility] New Patient Packet   Autism Son        Per [facility] New Patient Packet   Cancer Maternal Aunt        Per [place] New Patient Packet   Diabetes Maternal Aunt    Diabetes Maternal Uncle        Per [place] New Patient Packet    Medications: [name] Medications  New Prescriptions   No medications on file  Previous Medications   ACETAMINOPHEN (TYLENOL) 650 MG CR TABLET    Take 650 mg by mouth as needed for pain.   CYCLOBENZAPRINE (FLEXERIL) 10 MG TABLET    Take 1 tablet (10 mg total) by mouth at bedtime.   EZETIMIBE (ZETIA) 10 MG TABLET    1 tablet Orally Once a day for 30 days   FAMOTIDINE (PEPCID) 20 MG TABLET    Take 20 mg by mouth daily.   LEVOTHYROXINE (SYNTHROID) 150 MCG TABLET    Take 150 mcg by mouth daily before breakfast.   LORATADINE (CLARITIN) 10 MG TABLET    Take 10 mg by mouth 3 (three) times a week.   TRIAMTERENE-HYDROCHLOROTHIAZIDE (MAXZIDE) 75-50 MG TABLET    Take 1 tablet by mouth daily.  Modified Medications   No  medications on file  Discontinued Medications   PREDNISONE (DELTASONE) 20 MG TABLET    Take 20 mg by mouth daily with breakfast.    Physical Exam:  Vitals:   08/12/22 0827  BP: 126/80  Pulse: 63  Temp: (!) 97.1 F (36.2 C)  TempSrc: Temporal  SpO2: 99%  Weight: 215 lb (97.5 kg)  Height: 5\' 11"  (1.803 m)   Body mass index is 29.99 kg/m. Wt Readings from Last 3 Encounters:  08/12/22 215 lb (97.5 kg)  07/14/22 213 lb 9.6 oz (96.9 kg)  07/13/22 215 lb (97.5 kg)    Physical Exam Constitutional:      General: She is not in acute distress.    Appearance: She is well-developed. She is not diaphoretic.  HENT:     Head: Normocephalic and atraumatic.     Mouth/Throat:     Pharynx: No oropharyngeal exudate.  Eyes:     Conjunctiva/sclera: Conjunctivae normal.     Pupils: Pupils are equal, round, and reactive to light.  Cardiovascular:     Rate and Rhythm: Normal rate and regular rhythm.     Heart sounds: Normal heart sounds.  Pulmonary:     Effort: Pulmonary effort is normal.     Breath sounds: Normal breath sounds.  Abdominal:     General: Bowel sounds are normal.     Palpations: Abdomen is soft.  Musculoskeletal:     Cervical back: Normal range of motion and neck supple.     Right lower leg: No edema.     Left lower leg: No edema.  Skin:    General: Skin is warm and dry.  Neurological:     Mental Status: She is alert.  Psychiatric:        Mood and Affect: Mood normal.     Labs reviewed: Basic Metabolic Panel: Recent Labs    04/12/22 1639 04/12/22 2202 05/01/22 1112 05/16/22 1208  NA 142  --  134 141  K 3.1*  --  3.1* 3.5  CL 101  --  93* 108  CO2 31  --  22 21*  GLUCOSE 109*  --  99 64*  BUN 10  --  7 7  CREATININE 1.04*  --  0.77 0.76  CALCIUM 9.1  --  9.9 9.5  TSH  --  0.928  --   --    Liver Function Tests: No results for input(s): "AST", "ALT", "ALKPHOS", "BILITOT", "PROT", "ALBUMIN" in the last 8760 hours. No results for input(s): "LIPASE",  "AMYLASE" in the last 8760 hours. No results for input(s): "AMMONIA" in the last 8760 hours. CBC: Recent Labs    04/12/22 1639 05/16/22 1208  WBC 7.5 5.4  HGB 14.2 14.0  HCT 44.9 43.3  MCV 90.9 90.4  PLT 369 330   Lipid Panel: No results for input(s): "CHOL", "HDL", "LDLCALC", "TRIG", "CHOLHDL", "LDLDIRECT" in the last 8760 hours. TSH: Recent Labs    04/12/22 2202  TSH 0.928   A1C: No results found for: "HGBA1C"   Assessment/Plan 1. Postoperative hypothyroidism -will follow up lab, continues on synthroid 150 mcg daily - TSH  2. Mixed hyperlipidemia -not currently on treatment but with hx of hyperlipidemia.  - CBC with Differential/Platelet - Lipid panel  3. Primary hypertension -Blood pressure well controlled, goal bp <140/90 Continue current medications and dietary modifications follow metabolic panel - Complete Metabolic Panel with eGFR - CBC with Differential/Platelet  4. Chronic idiopathic constipation -to start miralax 17 gm daily with 8oz of fluids Increase water intake and fiber.   5. Chronic intractable headache, unspecified headache type -ongoing, continues to work with PT, see neurology and neurosurgery for ongoing workup.   6. Dyspnea on exertion Under going pulmonary work up.   Return in about 3 months (around 11/12/2022) for routine follow up .  Carlos American. [place], [place] [facility] [phone]

## 2022-08-13 LAB — COMPLETE METABOLIC PANEL WITH GFR
AG Ratio: 1.1 (calc) (ref 1.0–2.5)
ALT: 12 U/L (ref 6–29)
AST: 17 U/L (ref 10–35)
Albumin: 3.8 g/dL (ref 3.6–5.1)
Alkaline phosphatase (APISO): 66 U/L (ref 37–153)
BUN: 8 mg/dL (ref 7–25)
CO2: 32 mmol/L (ref 20–32)
Calcium: 9.6 mg/dL (ref 8.6–10.4)
Chloride: 99 mmol/L (ref 98–110)
Creat: 0.67 mg/dL (ref 0.50–1.03)
Globulin: 3.5 g/dL (calc) (ref 1.9–3.7)
Glucose, Bld: 88 mg/dL (ref 65–139)
Potassium: 3.6 mmol/L (ref 3.5–5.3)
Sodium: 140 mmol/L (ref 135–146)
Total Bilirubin: 0.7 mg/dL (ref 0.2–1.2)
Total Protein: 7.3 g/dL (ref 6.1–8.1)
eGFR: 104 mL/min/{1.73_m2} (ref >=60)

## 2022-08-13 LAB — CBC WITH DIFFERENTIAL/PLATELET
Absolute Monocytes: 545 cells/uL (ref 200–950)
Basophils Absolute: 40 cells/uL (ref 0–200)
Basophils Relative: 0.8 %
Eosinophils Absolute: 555 cells/uL — ABNORMAL HIGH (ref 15–500)
Eosinophils Relative: 11.1 %
HCT: 39.4 % (ref 35.0–45.0)
Hemoglobin: 12.6 g/dL (ref 11.7–15.5)
Lymphs Abs: 1470 cells/uL (ref 850–3900)
MCH: 28.9 pg (ref 27.0–33.0)
MCHC: 32 g/dL (ref 32.0–36.0)
MCV: 90.4 fL (ref 80.0–100.0)
MPV: 9.3 fL (ref 7.5–12.5)
Monocytes Relative: 10.9 %
Neutro Abs: 2390 cells/uL (ref 1500–7800)
Neutrophils Relative %: 47.8 %
Platelets: 384 10*3/uL (ref 140–400)
RBC: 4.36 10*6/uL (ref 3.80–5.10)
RDW: 11.8 % (ref 11.0–15.0)
Total Lymphocyte: 29.4 %
WBC: 5 10*3/uL (ref 3.8–10.8)

## 2022-08-13 LAB — LIPID PANEL
Cholesterol: 243 mg/dL — ABNORMAL HIGH (ref <200)
HDL: 71 mg/dL (ref >=50)
LDL Cholesterol (Calc): 152 mg/dL (calc) — ABNORMAL HIGH
Non-HDL Cholesterol (Calc): 172 mg/dL (calc) — ABNORMAL HIGH (ref <130)
Total CHOL/HDL Ratio: 3.4 (calc) (ref <5.0)
Triglycerides: 90 mg/dL (ref <150)

## 2022-08-13 LAB — TSH: TSH: 0.89 mIU/L

## 2022-08-15 ENCOUNTER — Encounter: Payer: Self-pay | Admitting: Nurse Practitioner

## 2022-08-15 ENCOUNTER — Other Ambulatory Visit: Payer: Self-pay

## 2022-08-15 MED ORDER — EZETIMIBE 10 MG PO TABS: ORAL_TABLET | ORAL | 1 refills | Status: DC

## 2022-08-17 ENCOUNTER — Ambulatory Visit: Payer: Medicaid Other | Admitting: Nurse Practitioner

## 2022-08-17 ENCOUNTER — Telehealth: Payer: Self-pay | Admitting: Nurse Practitioner

## 2022-08-17 NOTE — Telephone Encounter (Signed)
Patient states she missed a call regarding rescheduling an appt.  Please call patient back at [phone]

## 2022-08-18 NOTE — Telephone Encounter (Signed)
ATC patient. LVMTCB. 

## 2022-08-19 ENCOUNTER — Other Ambulatory Visit: Payer: Medicaid Other

## 2022-08-19 ENCOUNTER — Ambulatory Visit
Admission: RE | Admit: 2022-08-19 | Discharge: 2022-08-19 | Disposition: A | Discharge status: Home / Self Care | Payer: Medicaid Other | Source: Ambulatory Visit | Attending: Neurology | Admitting: Neurology

## 2022-08-19 DIAGNOSIS — G08 Intracranial and intraspinal phlebitis and thrombophlebitis: Secondary | ICD-10-CM

## 2022-08-19 DIAGNOSIS — Q248 Other specified congenital malformations of heart: Secondary | ICD-10-CM

## 2022-08-19 MED ORDER — IOPAMIDOL (ISOVUE-370) INJECTION 76%
75.0000 mL | Freq: Once | INTRAVENOUS | Status: AC | PRN
  Administered 2022-08-19: 75 mL via INTRAVENOUS

## 2022-08-21 ENCOUNTER — Encounter: Payer: Self-pay | Admitting: Nurse Practitioner

## 2022-08-22 ENCOUNTER — Ambulatory Visit: Payer: Medicaid Other | Attending: Neurological Surgery

## 2022-08-22 ENCOUNTER — Ambulatory Visit: Payer: Medicaid Other | Admitting: Nurse Practitioner

## 2022-08-22 DIAGNOSIS — M542 Cervicalgia: Secondary | ICD-10-CM | POA: Insufficient documentation

## 2022-08-22 DIAGNOSIS — R262 Difficulty in walking, not elsewhere classified: Secondary | ICD-10-CM | POA: Insufficient documentation

## 2022-08-22 DIAGNOSIS — M62838 Other muscle spasm: Secondary | ICD-10-CM | POA: Insufficient documentation

## 2022-08-22 DIAGNOSIS — R278 Other lack of coordination: Secondary | ICD-10-CM | POA: Insufficient documentation

## 2022-08-23 ENCOUNTER — Other Ambulatory Visit: Payer: Medicaid Other

## 2022-08-24 ENCOUNTER — Ambulatory Visit: Payer: Medicaid Other | Admitting: Physical Therapy

## 2022-08-24 ENCOUNTER — Encounter: Payer: Self-pay | Admitting: Neurology

## 2022-08-25 ENCOUNTER — Ambulatory Visit: Payer: Medicaid Other | Admitting: Physical Therapy

## 2022-08-25 ENCOUNTER — Encounter: Payer: Self-pay | Admitting: Physical Therapy

## 2022-08-25 ENCOUNTER — Ambulatory Visit: Payer: Medicaid Other | Admitting: Adult Health

## 2022-08-25 DIAGNOSIS — M542 Cervicalgia: Secondary | ICD-10-CM

## 2022-08-25 DIAGNOSIS — M62838 Other muscle spasm: Secondary | ICD-10-CM

## 2022-08-25 DIAGNOSIS — R262 Difficulty in walking, not elsewhere classified: Secondary | ICD-10-CM | POA: Diagnosis present

## 2022-08-25 DIAGNOSIS — R278 Other lack of coordination: Secondary | ICD-10-CM | POA: Diagnosis present

## 2022-08-25 NOTE — Therapy (Signed)
OUTPATIENT PHYSICAL THERAPY CERVICAL EVALUATION   Patient Name: Teresa Kent MRN: [id number] DOB:11-22-1965, 57 y.o., female Today's Date: 08/25/2022  END OF SESSION:  PT End of Session - 08/25/22 0932     Visit Number 9    Date for PT Re-Evaluation 09/30/22    PT Start Time 0930    PT Stop Time 1010    PT Time Calculation (min) 40 min    Activity Tolerance Patient limited by pain    Behavior During Therapy [facility] for tasks assessed/performed              Past Medical History:  Diagnosis Date   Abnormal Pap smear 02/21/2003   Acne    Per records from [facility]   Anemia    heavy menses   Arthritis    Per [facility] New Patient Packet   Cervicogenic headache    Per [facility] New Patient Packet   Elevated sed rate    Per records from [place]   Endometriosis 02/21/2004   Fever blister    Per records from [place]   Fibroids, submucosal 02/21/2004   Goiter    Per records from [place]   H/O: menorrhagia 02/21/2004   High cholesterol    Per [place] New Patient Packet   Hx of mammogram 2024   Per [place] New Patient Packet   Hypertension    Hypothyroidism    Increased BMI 10/11/2010   Morbid obesity    Per records from [place]   Ovarian cyst, right 03/19/2004   Simple endometrial hyperplasia 02/21/2004   Vitamin D deficiency    Per records from [place]   Past Surgical History:  Procedure Laterality Date   DILATION AND CURETTAGE OF UTERUS  03/19/2004   DILITATION & CURRETTAGE/HYSTROSCOPY WITH NOVASURE ABLATION N/A 12/20/2012   Procedure: DILATATION & CURETTAGE/HYSTEROSCOPY WITH NOVASURE ABLATION;  Surgeon: Delice Lesch, MD;  Location: [facility];  Service: Gynecology;  Laterality: N/A;   OVARY SURGERY     resection of fibroid  03/19/2004   THYROID SURGERY     TUBAL LIGATION     Patient Active Problem List   Diagnosis Date Noted   Osteoarthritis of cervical spine 07/05/2022   Cervicogenic headache 07/05/2022   Chronic  intractable headache 06/07/2022   Cervical stenosis of spinal canal 06/07/2022   IIH (idiopathic intracranial hypertension) 04/19/2022   Hidradenitis suppurativa 12/19/2011    PCP: Caren Macadam, MD  REFERRING PROVIDER: Dawley, Theodoro Doing, DO  REFERRING DIAG: M54.2 (ICD-10-CM) - Cervicalgia   THERAPY DIAG:  Cervicalgia  Difficulty in walking, not elsewhere classified  Other lack of coordination  Other muscle spasm  Rationale for Evaluation and Treatment: Rehabilitation  ONSET DATE: 06/27/22  SUBJECTIVE:  SUBJECTIVE STATEMENT: Patient reports that she lost her grandmother this morning, stress is high.  PERTINENT HISTORY:  Per referring Physician: Patient reports Idiopathic Intracranial Hypertension and headaches, primarily on her B frontal and temporal region-pressure. Also C/O Occipital and suboccipital headaches. Treated for pseudotumor cerebral with Diamox, but could not tolerate the medication. Denies blurred vision denies N/V, or balance issues. C/O R sided pulsatile tinnitus since Thyroid surgery. C/O intermittent numbness in R hand and calf, L toes. Occasionally drops things in R hand. 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 No cord compression or signal change  PAIN:  Are you having pain? Yes: NPRS scale: 6-7/10 Pain location: R head and neck, B thoracic spine between shoulder blades, and lower lumbar spine Pain description: shaky, inflamed, pulling, pressure in head, stiffness in shoulders Aggravating factors: Nothing in particular-maybe sitting too long Relieving factors: Prednisone helps some, Tylenol arthritis, heat Lying flat is impossible for sleeping PRECAUTIONS: None  WEIGHT BEARING RESTRICTIONS: No  FALLS:  Has patient  fallen in last 6 months? 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  PLOF: Independent and Washing her hair causes severe pain  PATIENT GOALS: Relieve pressure in her head and neck, return to her previous level of activity without pain  NEXT MD VISIT: Not scheduled.  OBJECTIVE:   DIAGNOSTIC FINDINGS:  MRI 05/07/22 IMPRESSION: 1. 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 Mild-to-moderate bilateral foraminal stenosis C4-C5. A CT of the cervical spine may be useful to assess for OPLL, if clinically warranted. 2. Enlarged and partially empty sella, which is often a normal anatomic variant but can be associated with idiopathic intracranial hypertension (pseudotumor cerebri)  COGNITION: Overall cognitive status: Within functional limits for tasks assessed  SENSATION: WFL  POSTURE: rounded shoulders, forward head, increased lumbar lordosis, and anterior pelvic tilt  PALPATION: Severely tight and TTP in B upper traps, LS, TP in B up traps, L > R. B SCM very tight and TTP as well as scalenes. B cerv paraspinals also tight and TTP, R > L.  CERVICAL ROM:   Active ROM A/PROM (deg) eval  Flexion WNL  Extension WNL  Right lateral flexion 70%, feels tight  Left lateral flexion 75%, feels tight  Right rotation WNL, feels tight  Left rotation WNL, feels tight   (Blank rows = not tested)  UPPER EXTREMITY ROM: WFL, but painful in elevation and reaching to wash her hair.  UPPER EXTREMITY MMT: 5/5   CERVICAL SPECIAL TESTS:  Spurling's test: Positive and Distraction test: Positive  FUNCTIONAL TESTS:  Quick DASH test -26, 34%  TODAY'S TREATMENT:  DATE:  08/25/22 UBE L1 x 3 min each way Gentle STM to UT and cervical paraspinals, rhomboids Gentle passive stretch to same. Self stretch including reaching out and up with hands clasped out in front, post shoulder stretch Standing wall angel Seated rotation with B hands to R, lower body weight shifts x 6, repeat to L MH to neck and low back, patient declined estim.  08/11/22 UBE L1 x 3 min forward and back STM and stretch-B up traps, rhomboids Standing with back to wall- Red Tband shoulder horizontal abd, diagonal reach up to each side, 10 each Facing wall, 1# weights, lift off wall x 10 Standing shoulder ext, rows, ER, 2 x 10 reps each, Red Tband. Ball against wall in diamond pattern 5 x each direction. MH to neck and upper shoulders for pain relief.  08/09/22 NuStep L4 x 6 minutes DN by Roma Schanz PT for L up traps Cervical stretching- lower traps, upper traps, neck flexion Supine pelvic tilts x 10, SKTC, DKTC,  Seated figure 4, glut stretch, HS stretch Forward flexion stretch in sitting Palof press, 5#, 2 x 10 in each direction with min VC for posture.  08/04/22 DN by Roma Schanz, PT to R rhomboids and upper trap Seated stretch for R up traps and rhomboids Ocular-motor assessment Saccades- delayed after 10 reps, noted "black lines and dots" in her vision Nystagmus- none noted Ocular ROM WNL VOR irritated neck, so stopped Horizontal VOR cancellation triggered pressure and dizziness, resolved when activity stopped. Vertical testing deferred due to her neck pain  08/02/22 NuStep L5 x 6 minutes Seated chin tuck against ball on wall, 5 sec x 10 reps Seated trunk ext with hands on mat behind hips, with chin tuck and shoulder depression/add, 5 sec x 10 reps. Standing shoulder ext, rows, with 5# resistance, 2 x 10 reps each, ER with G tband, 2 x 10 reps Initiated STM and stretch  to upper shoulders and neck after strength training, but patient reported increased pain, so deferred MH to neck for pain relief.  07/28/22 NuStep L4 x 5 minutes MH and estim to cervical area x 10 minutes, with MH to low back due to reports of tightness Seated hip stretching including forward flex over physioball, forward/diagonal stretch, figure 4, glut stretch, and HS stretch  07/22/22 NuStep L5 x 5 minutes MH to soften tissues x 5 min, cerv STM to Up Traps, LS, sub occipital muscles, with head pull Attempted mechanical traction, but she did not tolerate, caused muscle spasm in L sub occiptial muscles. Seated self stretch and ROM to her neck in all planes Doorway stretch to chest  07/20/22 NuStep using U and LE L5 x 5 minutes STM to upper traps, cerv paraspinals, LS, scalenes and SCM, R tihgter than L, F/B stretch Supine chin tucks with shoulder  Seated stretching for Up traps, LS, cervical paraspinals  07/08/22  Education  PATIENT EDUCATION:  Education details: POC Person educated: Patient Education method: Explanation Education comprehension: verbalized understanding  HOME EXERCISE PROGRAM: [id number]  ASSESSMENT:  CLINICAL IMPRESSION: Patient had cervical MRI and head CT. Unable to read the results, but she reports that she was referred to another specialist. She lost her grandmother this morning. Treatment focused on STM and stretch and tension reduction for pain relief.  OBJECTIVE IMPAIRMENTS: decreased activity tolerance, decreased balance, decreased coordination, difficulty walking, and pain.   ACTIVITY LIMITATIONS: carrying, lifting, bending, squatting, and locomotion level  PARTICIPATION LIMITATIONS: cleaning, laundry, shopping, community activity, and occupation  PERSONAL FACTORS:  Past/current experiences are also affecting patient's functional outcome.   REHAB POTENTIAL: Good  CLINICAL DECISION MAKING: Evolving/moderate complexity  EVALUATION COMPLEXITY:  Moderate   GOALS: Goals reviewed with patient? Yes  SHORT TERM GOALS: Target date: 07/23/22  I with initial HEP Baseline:  Goal status: 07/20/22-provided-ongoing 08/02/22- met  LONG TERM GOALS: Target date: 09/30/22  I with final HEP Baseline:  Goal status: INITIAL  2.  Patient will recover full painfree ROM in her neck Baseline: Mildly limited, but painful Goal status: 08/25/22 ongoing  3.  Improve Quick Dash score to < 15, < 10% disability Baseline: 26, 34% disability Goal status: 07/28/22 ongoing  4.  Patient will return to sleeping in a horizontal position Baseline: Unable to lie flat. Goal status: 08/25/22 ongoing  5.  Patient will report the ability to perform all her normal daily activities with pain < 3/10 Baseline: 7/10 Goal status: 08/25/22, ongoing  6.  Ambulate at least 20 minutes with pain < 3/10 Baseline: Limited gait due to pain Goal status: 08/11/22-ongoing   PLAN:  PT FREQUENCY: 1-2x/week  PT DURATION: 12 weeks  PLANNED INTERVENTIONS: Therapeutic exercises, Therapeutic activity, Neuromuscular re-education, Balance training, Gait training, Patient/Family education, Self Care, Joint mobilization, Dry Needling, Electrical stimulation, Cryotherapy, Moist heat, Taping, Traction, Ultrasound, Ionotophoresis 4mg /ml Dexamethasone, and Manual therapy  PLAN FOR NEXT SESSION: STM, DN, Add PPT to HEP-update strengthening.   Marcelina Morel, DPT 08/25/2022, 10:04 AM

## 2022-08-29 ENCOUNTER — Encounter: Payer: Self-pay | Admitting: Neurology

## 2022-08-29 ENCOUNTER — Telehealth: Payer: Self-pay | Admitting: Neurology

## 2022-08-29 NOTE — Telephone Encounter (Signed)
Pt sent Dr. Anitra Lauth message with further questions. This was forwarded to MD for review.

## 2022-08-29 NOTE — Telephone Encounter (Signed)
I called patient earlier today she did not answer. Please call her.  The CTV was similar to the MRV and did again show that the veins are small and this could be contributing to her symptoms but Dr. Lionel December has to evaluate that. That is why we sent her to Dr. Rayburn Ma back in February and he is the right person to discuss the next steps on the 15th thank you

## 2022-08-29 NOTE — Telephone Encounter (Signed)
I called patient. Left message if she has any questions about MRV to call us, she has already been referred to Dr. Lionel December for this.    FYI I have referred her to Dr. Lionel December for her venous stenosis. As below, she could have significant stenosis causing her symptoms. See dr Lionel December neurosurgery, already referred, for eval of stenting   IMPRESSION: 1. No evidence of acute venous sinus thrombosis. 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 The right transverse sinus is dominant. Low-density intraluminal filling defects consistent with arachnoid granulations. Patient could have functionally significant stenoses. 2. 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 This can be a normal variant or can be associated with intracranial hypertension. Notably, the patient had normal opening pressure in December of last year measuring 10 cm of water. With this was repeated in February of this year, opening pressure was 19 cm of water, borderline elevated

## 2022-08-30 ENCOUNTER — Encounter: Payer: Self-pay | Admitting: Physical Therapy

## 2022-08-30 ENCOUNTER — Ambulatory Visit: Payer: Medicaid Other | Admitting: Physical Therapy

## 2022-08-30 DIAGNOSIS — M542 Cervicalgia: Secondary | ICD-10-CM

## 2022-08-30 DIAGNOSIS — R262 Difficulty in walking, not elsewhere classified: Secondary | ICD-10-CM

## 2022-08-30 DIAGNOSIS — R278 Other lack of coordination: Secondary | ICD-10-CM

## 2022-08-30 DIAGNOSIS — M62838 Other muscle spasm: Secondary | ICD-10-CM

## 2022-08-30 NOTE — Therapy (Addendum)
OUTPATIENT PHYSICAL THERAPY CERVICAL EVALUATION  Progress Note Reporting Period 07/08/22 to 08/30/22  See note below for Objective Data and Assessment of Progress/Goals.      Patient Name: Teresa Kent MRN: [id number] DOB:[birth year], 57 y.o., female Today's Date: 08/30/2022  END OF SESSION:  PT End of Session - 08/30/22 0808     Visit Number 10    Date for PT Re-Evaluation 09/30/22    PT Start Time 0801    PT Stop Time 0840    PT Time Calculation (min) 39 min    Activity Tolerance Patient limited by pain    Behavior During Therapy [facility] for tasks assessed/performed              Past Medical History:  Diagnosis Date   Abnormal Pap smear 02/21/2003   Acne    Per records from [facility]   Anemia    heavy menses   Arthritis    Per [facility] New Patient Packet   Cervicogenic headache    Per [facility] New Patient Packet   Elevated sed rate    Per records from [facility]   Endometriosis 02/21/2004   Fever blister    Per records from [facility]   Fibroids, submucosal 02/21/2004   Goiter    Per records from [facility]   H/O: menorrhagia 02/21/2004   High cholesterol    Per [facility] New Patient Packet   Hx of mammogram 2024   Per [facility] New Patient Packet   Hypertension    Hypothyroidism    Increased BMI 10/11/2010   Morbid obesity    Per records from [facility]   Ovarian cyst, right 03/19/2004   Simple endometrial hyperplasia 02/21/2004   Vitamin D deficiency    Per records from [facility]   Past Surgical History:  Procedure Laterality Date   DILATION AND CURETTAGE OF UTERUS  03/19/2004   DILITATION & CURRETTAGE/HYSTROSCOPY WITH NOVASURE ABLATION N/A 12/20/2012   Procedure: DILATATION & CURETTAGE/HYSTEROSCOPY WITH NOVASURE ABLATION;  Surgeon: Purcell Nails, MD;  Location: [facility];  Service: Gynecology;  Laterality: N/A;   OVARY SURGERY     resection of fibroid  03/19/2004   THYROID SURGERY     TUBAL LIGATION     Patient Active Problem  List   Diagnosis Date Noted   Osteoarthritis of cervical spine 07/05/2022   Cervicogenic headache 07/05/2022   Chronic intractable headache 06/07/2022   Cervical stenosis of spinal canal 06/07/2022   IIH (idiopathic intracranial hypertension) 04/19/2022   Hidradenitis suppurativa 12/19/2011    PCP: Aliene Beams, MD  REFERRING PROVIDER: Dawley, Alan Mulder, DO  REFERRING DIAG: M54.2 (ICD-10-CM) - Cervicalgia   THERAPY DIAG:  Cervicalgia  Difficulty in walking, not elsewhere classified  Other lack of coordination  Other muscle spasm  Rationale for Evaluation and Treatment: Rehabilitation  ONSET DATE: 06/27/22  SUBJECTIVE:  SUBJECTIVE STATEMENT: Patient reports that she has an appointment with a different Neurologist in [place] next Monday.  PERTINENT HISTORY:  Per referring Physician: Patient reports Idiopathic Intracranial Hypertension and headaches, primarily on her B frontal and temporal region-pressure. Also C/O Occipital and suboccipital headaches. Treated for pseudotumor cerebral with Diamox, but could not tolerate the medication. Denies blurred vision denies N/V, or balance issues. C/O R sided pulsatile tinnitus since Thyroid surgery. C/O intermittent numbness in R hand and calf, L toes. Occasionally drops things in R hand. 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 No cord compression or signal change  PAIN:  Are you having pain? Yes: NPRS scale: 6-7/10 Pain location: R head and neck, B thoracic spine between shoulder blades, and lower lumbar spine Pain description: shaky, inflamed, pulling, pressure in head, stiffness in shoulders Aggravating factors: Nothing in particular-maybe sitting too long Relieving factors: Prednisone helps some,  Tylenol arthritis, heat Lying flat is impossible for sleeping PRECAUTIONS: None  WEIGHT BEARING RESTRICTIONS: No  FALLS:  Has patient fallen in last 6 months? 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  PLOF: Independent and Washing her hair causes severe pain  PATIENT GOALS: Relieve pressure in her head and neck, return to her previous level of activity without pain  NEXT MD VISIT: Not scheduled.  OBJECTIVE:   DIAGNOSTIC FINDINGS:  MRI 05/07/22 IMPRESSION: 1. 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 Mild-to-moderate bilateral foraminal stenosis C4-C5. A CT of the cervical spine may be useful to assess for OPLL, if clinically warranted. 2. Enlarged and partially empty sella, which is often a normal anatomic variant but can be associated with idiopathic intracranial hypertension (pseudotumor cerebri)  COGNITION: Overall cognitive status: Within functional limits for tasks assessed  SENSATION: WFL  POSTURE: rounded shoulders, forward head, increased lumbar lordosis, and anterior pelvic tilt  PALPATION: Severely tight and TTP in B upper traps, LS, TP in B up traps, L > R. B SCM very tight and TTP as well as scalenes. B cerv paraspinals also tight and TTP, R > L.  CERVICAL ROM:   Active ROM A/PROM (deg) eval  Flexion WNL  Extension WNL  Right lateral flexion 70%, feels tight  Left lateral flexion 75%, feels tight  Right rotation WNL, feels tight  Left rotation WNL, feels tight   (Blank rows = not tested)  UPPER EXTREMITY ROM: WFL, but painful in elevation and reaching to wash her hair.  UPPER EXTREMITY MMT: 5/5   CERVICAL SPECIAL TESTS:  Spurling's  test: Positive and Distraction test: Positive  FUNCTIONAL TESTS:  Quick DASH test -26, 34%  TODAY'S TREATMENT:  DATE:  08/30/22 NuStep L4 x 6 minutes Re-assessment for PN-see goals Massage gun-patient educated to the availability, performed self massage as she can control the pressure. Cervical stretching, UT, reach and lift arms. Cat/Cow-patient attempted before and it caused back pain Child's pose, 2 x 15 sec MH to neck and low back for pain relief.  08/25/22 UBE L1 x 3 min each way Gentle STM to UT and cervical paraspinals, rhomboids Gentle passive stretch to same. Self stretch including reaching out and up with hands clasped out in front, post shoulder stretch Standing wall angel Seated rotation with B hands to R, lower body weight shifts x 6, repeat to L MH to neck and low back, patient declined estim.  08/11/22 UBE L1 x 3 min forward and back STM and stretch-B up traps, rhomboids Standing with back to wall- Red Tband shoulder horizontal abd, diagonal reach up to each side, 10 each Facing wall, 1# weights, lift off wall x 10 Standing shoulder ext, rows, ER, 2 x 10 reps each, Red Tband. Ball against wall in diamond pattern 5 x each direction. MH to neck and upper shoulders for pain relief.  08/09/22 NuStep L4 x 6 minutes DN by Otelia Santee PT for L up traps Cervical stretching- lower traps, upper traps, neck flexion Supine pelvic tilts x 10, SKTC, DKTC,  Seated figure 4, glut stretch, HS stretch Forward flexion stretch in sitting Palof press, 5#, 2 x 10 in each direction with min VC for posture.   PATIENT EDUCATION:  Education details: POC Person educated: Patient Education method: Explanation Education comprehension: verbalized understanding  HOME EXERCISE PROGRAM: [id number]  ASSESSMENT:  CLINICAL IMPRESSION: Patient has an appointment with  a new Neurologist on Monday. Functional re-assessment performed. She has made some improvement, but limited. Educated her to use massage gun. She was able to apply appropriate pressure to feel relief from neck tension. This may be a good tool for her after therapy ends for more chronic management.  OBJECTIVE IMPAIRMENTS: decreased activity tolerance, decreased balance, decreased coordination, difficulty walking, and pain.   ACTIVITY LIMITATIONS: carrying, lifting, bending, squatting, and locomotion level  PARTICIPATION LIMITATIONS: cleaning, laundry, shopping, community activity, and occupation  PERSONAL FACTORS: Past/current experiences are also affecting patient's functional outcome.   REHAB POTENTIAL: Good  CLINICAL DECISION MAKING: Evolving/moderate complexity  EVALUATION COMPLEXITY: Moderate   GOALS: Goals reviewed with patient? Yes  SHORT TERM GOALS: Target date: 07/23/22  I with initial HEP Baseline:  Goal status: 07/20/22-provided-ongoing 08/02/22- met  LONG TERM GOALS: Target date: 09/30/22  I with final HEP Baseline:  Goal status: 08/30/22-ongoing  2.  Patient will recover full painfree ROM in her neck Baseline: Mildly limited, but painful Goal status: 08/30/22 ongoing-unchanged  3.  Improve Quick Dash score to < 15, < 10% disability Baseline: 26, 34% disability Goal status: 08/30/22 33, 75% disability  4.  Patient will return to sleeping in a horizontal position Baseline: Unable to lie flat. Goal status: 08/30/22 ongoing-She has been fearful to try lying flat.  5.  Patient will report the ability to perform all her normal daily activities with pain < 3/10 Baseline: 7/10 Goal status: 08/30/22, ongoing-had been improving, but in the past several days she has been getting headaches-feels sinuses are also contributing 6-7/10  6.  Ambulate at least 20 minutes with pain < 3/10 Baseline: Limited gait due to pain Goal status: 08/30/22-met  PLAN:  PT FREQUENCY: 1-2x/week  PT  DURATION: 12 weeks  PLANNED INTERVENTIONS: Therapeutic exercises, Therapeutic activity, Neuromuscular re-education,  Balance training, Gait training, Patient/Family education, Self Care, Joint mobilization, Dry Needling, Electrical stimulation, Cryotherapy, Moist heat, Taping, Traction, Ultrasound, Ionotophoresis 4mg /ml Dexamethasone, and Manual therapy  PLAN FOR NEXT SESSION: STM, DN, Add PPT to HEP-update strengthening.   Iona BeardSusan M Wilder Amodei, DPT 08/30/2022, 8:45 AM

## 2022-08-30 NOTE — Telephone Encounter (Signed)
I called pt and let her know that her CTV was similar to the MRV that was done earlier.  The veins are small and this could be contributing to her symptoms. She has appt with [facility], Dr. Kelby Aline in [place] to evaluate for this.  She is to make sure she has the the imaging studies and will call [facility] about CTV to take with her.  She wanted to make sure her information was correct that she was taking diamox when had her first LP.  She was 10cm,  04-25-2022.  She was taken off diamox and LP repeated 06-28-2022 and she was 19cm.  Per Dr. Lucia Gaskins her result note:    " your opening pressure was normal even wihout being on drugs. I think you were prematurely placed on the diamox before workup. I agree, I don;t think you have IDIOPATHIC INTRACRANIAL HYPERTENSION based on these results and normal eye exam despite what was seen on MRI"  .  Pt will call [facility] to get CD for her neurosurgery visit on 09-05-2022.    She appreciated call back.

## 2022-09-01 ENCOUNTER — Encounter: Payer: Self-pay | Admitting: Physical Therapy

## 2022-09-01 ENCOUNTER — Ambulatory Visit: Payer: Medicaid Other | Admitting: Physical Therapy

## 2022-09-01 DIAGNOSIS — R262 Difficulty in walking, not elsewhere classified: Secondary | ICD-10-CM

## 2022-09-01 DIAGNOSIS — R278 Other lack of coordination: Secondary | ICD-10-CM

## 2022-09-01 DIAGNOSIS — M542 Cervicalgia: Secondary | ICD-10-CM

## 2022-09-01 DIAGNOSIS — M62838 Other muscle spasm: Secondary | ICD-10-CM

## 2022-09-01 NOTE — Therapy (Signed)
OUTPATIENT PHYSICAL THERAPY CERVICAL EVALUATION  Progress Note Reporting Period 07/08/22 to 08/30/22  See note below for Objective Data and Assessment of Progress/Goals.      Patient Name: Teresa Kent MRN: [id number] DOB:08/22/1965, 57 y.o., female Today's Date: 09/01/2022  END OF SESSION:  PT End of Session - 09/01/22 0847     Visit Number 11    Date for PT Re-Evaluation 09/30/22    PT Start Time 0844    PT Stop Time 0925    PT Time Calculation (min) 41 min    Activity Tolerance Patient limited by pain    Behavior During Therapy [facility] for tasks assessed/performed              Past Medical History:  Diagnosis Date   Abnormal Pap smear 02/21/2003   Acne    Per records from [facility]   Anemia    heavy menses   Arthritis    Per [facility] New Patient Packet   Cervicogenic headache    Per [facility] New Patient Packet   Elevated sed rate    Per records from [facility]   Endometriosis 02/21/2004   Fever blister    Per records from [facility]   Fibroids, submucosal 02/21/2004   Goiter    Per records from [facility]   H/O: menorrhagia 02/21/2004   High cholesterol    Per [facility] New Patient Packet   Hx of mammogram 2024   Per [facility] New Patient Packet   Hypertension    Hypothyroidism    Increased BMI 10/11/2010   Morbid obesity    Per records from [facility]   Ovarian cyst, right 03/19/2004   Simple endometrial hyperplasia 02/21/2004   Vitamin D deficiency    Per records from [facility]   Past Surgical History:  Procedure Laterality Date   DILATION AND CURETTAGE OF UTERUS  03/19/2004   DILITATION & CURRETTAGE/HYSTROSCOPY WITH NOVASURE ABLATION N/A 12/20/2012   Procedure: DILATATION & CURETTAGE/HYSTEROSCOPY WITH NOVASURE ABLATION;  Surgeon: Purcell Nails, MD;  Location: [facility];  Service: Gynecology;  Laterality: N/A;   OVARY SURGERY     resection of fibroid  03/19/2004   THYROID SURGERY     TUBAL LIGATION     Patient Active Problem  List   Diagnosis Date Noted   Osteoarthritis of cervical spine 07/05/2022   Cervicogenic headache 07/05/2022   Chronic intractable headache 06/07/2022   Cervical stenosis of spinal canal 06/07/2022   IIH (idiopathic intracranial hypertension) 04/19/2022   Hidradenitis suppurativa 12/19/2011    PCP: Aliene Beams, MD  REFERRING PROVIDER: Dawley, Alan Mulder, DO  REFERRING DIAG: M54.2 (ICD-10-CM) - Cervicalgia   THERAPY DIAG:  Cervicalgia  Difficulty in walking, not elsewhere classified  Other lack of coordination  Other muscle spasm  Rationale for Evaluation and Treatment: Rehabilitation  ONSET DATE: 06/27/22  SUBJECTIVE:  SUBJECTIVE STATEMENT: Patient reports that she did get some relief from her last treatment, but it was temporary. She has an appointment with a different Neurologist in [place] next Monday.  PERTINENT HISTORY:  Per referring Physician: Patient reports Idiopathic Intracranial Hypertension and headaches, primarily on her B frontal and temporal region-pressure. Also C/O Occipital and suboccipital headaches. Treated for pseudotumor cerebral with Diamox, but could not tolerate the medication. Denies blurred vision denies N/V, or balance issues. C/O R sided pulsatile tinnitus since Thyroid surgery. C/O intermittent numbness in R hand and calf, L toes. Occasionally drops things in R hand. 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 No cord compression or signal change  PAIN:  Are you having pain? Yes: NPRS scale: 6-7/10 Pain location: R head and neck, B thoracic spine between shoulder blades, and lower lumbar spine Pain description: shaky, inflamed, pulling, pressure in head, stiffness in shoulders Aggravating factors: Nothing in  particular-maybe sitting too long Relieving factors: Prednisone helps some, Tylenol arthritis, heat Lying flat is impossible for sleeping PRECAUTIONS: None  WEIGHT BEARING RESTRICTIONS: No  FALLS:  Has patient fallen in last 6 months? 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  PLOF: Independent and Washing her hair causes severe pain  PATIENT GOALS: Relieve pressure in her head and neck, return to her previous level of activity without pain  NEXT MD VISIT: Not scheduled.  OBJECTIVE:   DIAGNOSTIC FINDINGS:  MRI 05/07/22 IMPRESSION: 1. 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 Mild-to-moderate bilateral foraminal stenosis C4-C5. A CT of the cervical spine may be useful to assess for OPLL, if clinically warranted. 2. Enlarged and partially empty sella, which is often a normal anatomic variant but can be associated with idiopathic intracranial hypertension (pseudotumor cerebri)  COGNITION: Overall cognitive status: Within functional limits for tasks assessed  SENSATION: WFL  POSTURE: rounded shoulders, forward head, increased lumbar lordosis, and anterior pelvic tilt  PALPATION: Severely tight and TTP in B upper traps, LS, TP in B up traps, L > R. B SCM very tight and TTP as well as scalenes. B cerv paraspinals also tight and TTP, R > L.  CERVICAL ROM:   Active ROM A/PROM (deg) eval  Flexion WNL  Extension WNL  Right lateral flexion 70%, feels tight  Left lateral flexion 75%, feels tight  Right rotation WNL, feels tight  Left rotation WNL, feels tight   (Blank rows = not tested)  UPPER EXTREMITY ROM: WFL, but painful in elevation and reaching to wash  her hair.  UPPER EXTREMITY MMT: 5/5   CERVICAL SPECIAL TESTS:  Spurling's test: Positive and Distraction test: Positive  FUNCTIONAL TESTS:  Quick DASH test -26, 34%  TODAY'S TREATMENT:  DATE:  09/01/22 NuStep L5 x 6 minutes Patient performed multiple strengtehning exercises including seated push ups, wall push ups, diagonals against Yellow Tband,Shoulder flexion facing wall with Tband or 2# weights. Seated lt pulls with 15#, seated row with 10 #, x 10 each and trunk ext against Black Tband 2 x 10 reps. DN by Otelia SanteeMike Albright, PT for cervical paraspinals, UT.  08/30/22 NuStep L4 x 6 minutes Re-assessment for PN-see goals Massage gun-patient educated to the availability, performed self massage as she can control the pressure. Cervical stretching, UT, reach and lift arms. Cat/Cow-patient attempted before and it caused back pain Child's pose, 2 x 15 sec MH to neck and low back for pain relief.  08/25/22 UBE L1 x 3 min each way Gentle STM to UT and cervical paraspinals, rhomboids Gentle passive stretch to same. Self stretch including reaching out and up with hands clasped out in front, post shoulder stretch Standing wall angel Seated rotation with B hands to R, lower body weight shifts x 6, repeat to L MH to neck and low back, patient declined estim.  08/11/22 UBE L1 x 3 min forward and back STM and stretch-B up traps, rhomboids Standing with back to wall- Red Tband shoulder horizontal abd, diagonal reach up to each side, 10 each Facing wall, 1# weights, lift off wall x 10 Standing shoulder ext, rows, ER, 2 x 10 reps each, Red Tband. Ball against wall in diamond pattern 5 x each direction. MH to neck and upper shoulders for pain relief.  08/09/22 NuStep L4 x 6 minutes DN by Otelia SanteeMike Albright PT for L up traps Cervical stretching- lower traps, upper traps, neck  flexion Supine pelvic tilts x 10, SKTC, DKTC,  Seated figure 4, glut stretch, HS stretch Forward flexion stretch in sitting Palof press, 5#, 2 x 10 in each direction with min VC for posture.   PATIENT EDUCATION:  Education details: POC Person educated: Patient Education method: Explanation Education comprehension: verbalized understanding  HOME EXERCISE PROGRAM: [id number]  ASSESSMENT:  CLINICAL IMPRESSION: Patient has an appointment with a new Neurologist on Monday. She performed multiple scapular stabilizing and UT strengthening exercises, added to HEP for F/U at the conclusion of PT. DN provided for cervical and UT tightness.  OBJECTIVE IMPAIRMENTS: decreased activity tolerance, decreased balance, decreased coordination, difficulty walking, and pain.   ACTIVITY LIMITATIONS: carrying, lifting, bending, squatting, and locomotion level  PARTICIPATION LIMITATIONS: cleaning, laundry, shopping, community activity, and occupation  PERSONAL FACTORS: Past/current experiences are also affecting patient's functional outcome.   REHAB POTENTIAL: Good  CLINICAL DECISION MAKING: Evolving/moderate complexity  EVALUATION COMPLEXITY: Moderate   GOALS: Goals reviewed with patient? Yes  SHORT TERM GOALS: Target date: 07/23/22  I with initial HEP Baseline:  Goal status: 07/20/22-provided-ongoing 08/02/22- met  LONG TERM GOALS: Target date: 09/30/22  I with final HEP Baseline:  Goal status: 08/30/22-ongoing  2.  Patient will recover full painfree ROM in her neck Baseline: Mildly limited, but painful Goal status: 08/30/22 ongoing-unchanged  3.  Improve Quick Dash score to < 15, < 10% disability Baseline: 26, 34% disability Goal status: 08/30/22 33, 75% disability  4.  Patient will return to sleeping in a horizontal position Baseline: Unable to lie flat. Goal status: 08/30/22 ongoing-She has been fearful to try lying flat.  5.  Patient will report the ability to perform all her normal  daily activities with pain < 3/10 Baseline: 7/10 Goal status: 08/30/22, ongoing-had been improving, but in the past several days she has been getting headaches-feels sinuses  are also contributing 6-7/10  6.  Ambulate at least 20 minutes with pain < 3/10 Baseline: Limited gait due to pain Goal status: 08/30/22-met  PLAN:  PT FREQUENCY: 1-2x/week  PT DURATION: 12 weeks  PLANNED INTERVENTIONS: Therapeutic exercises, Therapeutic activity, Neuromuscular re-education, Balance training, Gait training, Patient/Family education, Self Care, Joint mobilization, Dry Needling, Electrical stimulation, Cryotherapy, Moist heat, Taping, Traction, Ultrasound, Ionotophoresis 4mg /ml Dexamethasone, and Manual therapy  PLAN FOR NEXT SESSION: STM, DN, Add PPT to HEP-update strengthening.   Iona Beard, DPT 09/01/2022, 9:24 AM

## 2022-09-02 ENCOUNTER — Ambulatory Visit: Payer: Medicaid Other | Admitting: Primary Care

## 2022-09-02 ENCOUNTER — Ambulatory Visit: Payer: Medicaid Other | Admitting: Nurse Practitioner

## 2022-09-02 ENCOUNTER — Encounter: Payer: Medicaid Other | Admitting: Internal Medicine

## 2022-09-02 NOTE — Progress Notes (Deleted)
Patient refused to complete test after trying multiple times. Patient stated that she is too short of breath to do the Pulmonary Function Test.

## 2022-09-06 ENCOUNTER — Encounter: Payer: Self-pay | Admitting: Neurology

## 2022-09-06 ENCOUNTER — Ambulatory Visit: Payer: Medicaid Other | Admitting: Physical Therapy

## 2022-09-06 ENCOUNTER — Encounter: Payer: Self-pay | Admitting: Nurse Practitioner

## 2022-09-12 ENCOUNTER — Other Ambulatory Visit: Payer: Medicaid Other

## 2022-09-15 DIAGNOSIS — J3489 Other specified disorders of nose and nasal sinuses: Secondary | ICD-10-CM | POA: Insufficient documentation

## 2022-09-16 ENCOUNTER — Encounter: Payer: Self-pay | Admitting: Nurse Practitioner

## 2022-09-22 ENCOUNTER — Ambulatory Visit: Payer: Medicaid Other | Admitting: Adult Health

## 2022-09-26 ENCOUNTER — Telehealth: Payer: Self-pay | Admitting: Student

## 2022-09-26 NOTE — Telephone Encounter (Signed)
She's scheduled to see me 5/7 after HRCT chest but I don't see that HRCT chest has been scheduled. There's an order to import a CT into PACS from [facility] but that may be for a CT venogram of her head and I don't see any HRCT Chest in PACS. Can HRCT be scheduled today or tomorrow or does this appointment need to be rescheduled?  Thanks!

## 2022-09-26 NOTE — Progress Notes (Unsigned)
Synopsis: Referred for dyspnea by Sharon Seller, NP  Subjective:   PATIENT ID: Teresa Kent GENDER: female DOB: 1965-12-04, MRN: [id number]  No chief complaint on file.  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 Kendrick Fries 2/21 HRCT, PFTs ordered - attempted but unable to complete testing.   Otherwise pertinent review of systems is negative.  Past Medical History:  Diagnosis Date   Abnormal Pap smear 02/21/2003   Acne    Per records from [facility]   Acute sinusitis    Per Records from [facility]   Anemia    heavy menses   Arthritis    Per [facility] New Patient Packet   Cervicogenic headache    Per [facility] New Patient Packet   Colon cancer screening 03/10/2022   Per Records from [facility]   COVID    Per Records from [facility]   Elevated sed rate    Per records from [facility]   Elevated sed rate    Per Records from [facility]   Endometriosis 02/21/2004   Fever blister    Per records from [facility]   Fibroids, submucosal 02/21/2004   GERD (gastroesophageal reflux disease)    Per Records from [facility]   Goiter    Per records from [facility]   H/O: menorrhagia 02/21/2004   High cholesterol    Per [facility] New Patient Packet   Hx of mammogram 2024   Per [facility] New Patient Packet   Hypertension    Hypothyroidism    Idiopathic intracranial hypertension    Per Records from [facility]   Increased BMI 10/11/2010   Lower extremity edema    Per Records from [facility]   Morbid obesity [facility])    Per records from [facility]   Ovarian cyst, right 03/19/2004   Papanicolaou smear 09/2021   Per Records from [facility]   Postprocedural hypothyroidism    Per Records from [facility]   Simple endometrial hyperplasia 02/21/2004   Vitamin D  deficiency    Per records from [facility]     Family History  Problem Relation Age of Onset   Hypotension Mother    High blood pressure Mother        Per [facility] New Patient Packet   High Cholesterol Mother        Per [facility] New Patient Packet   Arthritis Mother        Per [facility] New Patient Packet   High blood pressure Brother        Per [facility] New Patient Packet   High Cholesterol Brother        Per [facility] New Patient Packet   High blood pressure Son        Per [facility] New Patient Packet   High Cholesterol Son        Per [facility] New Patient Packet   Thyroid disease Son        Per [facility] New Patient Packet   Mood Disorder Son        Per [facility] New Patient Packet   Autism Son        Per [facility] New Patient Packet   Cancer Maternal Aunt        Per [facility] New Patient Packet   Diabetes Maternal Aunt    Diabetes Maternal Uncle  Per [facility] New Patient Packet   Breast cancer Maternal Grandmother    Diabetes Maternal Grandmother    Stroke Maternal Grandmother        Per [facility] New Patient Packet   Arthritis Maternal Grandmother        Per [facility] New Patient Packet     Past Surgical History:  Procedure Laterality Date   DILATION AND CURETTAGE OF UTERUS  03/19/2004   DILITATION & CURRETTAGE/HYSTROSCOPY WITH NOVASURE ABLATION N/A 12/20/2012   Procedure: DILATATION & CURETTAGE/HYSTEROSCOPY WITH NOVASURE ABLATION;  Surgeon: Purcell Nails, MD;  Location: [facility];  Service: Gynecology;  Laterality: N/A;   OVARY SURGERY     resection of fibroid  03/19/2004   THYROID SURGERY     TUBAL LIGATION      Social History   Socioeconomic History   Marital status: Single    Spouse name: Not on file   Number of children: Not on file   Years of education: Not on file   Highest education level: Not on file  Occupational History   Not on file  Tobacco Use   Smoking status: Never   Smokeless tobacco: Never  Vaping Use   Vaping Use: Never used  Substance and Sexual Activity   Alcohol use: No   Drug use: No    Sexual activity: Not on file  Other Topics Concern   Not on file  Social History Narrative   Right Handed   No Caffeine use       Per [facility] New Patient Packet:      Diet:Left Blank       Caffeine: No      Married, if yes what year: Single       Do you live in a house, apartment, assisted living, condo, trailer, ect: Apartment       Is it one or more stories:One       How many persons live in your home? 2      Pets:No      Highest level or education completed: College      Current/Past profession: PreK Teacher       Exercise:       Rarley           Type and how often: weekly walking          Living Will: No   DNR: No   POA/HPOA: No      Functional Status:   Do you have difficulty bathing or dressing yourself? Left Blank    Do you have difficulty preparing food or eating?Left Blank    Do you have difficulty managing your medications?Left Blank    Do you have difficulty managing your finances?Left Blank    Do you have difficulty affording your medications?Left Blank    Social Determinants of Health   Financial Resource Strain: Not on file  Food Insecurity: Not on file  Transportation Needs: Not on file  Physical Activity: Not on file  Stress: Not on file  Social Connections: Not on file  Intimate Partner Violence: Not on file     Allergies  Allergen Reactions   Doxycycline     Per records from [facility], worsening acne side effects    Prilosec [Omeprazole]     Chest pains   Simvastatin     Weakness, mind fog, and back pain    Vitamin D Analogs     Constipation with OTC products, Per records from [facility]     Outpatient Medications Prior  to Visit  Medication Sig Dispense Refill   acetaminophen (TYLENOL) 650 MG CR tablet Take 650 mg by mouth as needed for pain.     ezetimibe (ZETIA) 10 MG tablet 1 tablet Orally Once a day for 30 days 90 tablet 1   levothyroxine (SYNTHROID) 150 MCG tablet Take 150 mcg by mouth daily before breakfast.      loratadine (CLARITIN) 10 MG tablet Take 10 mg by mouth 3 (three) times a week.     triamterene-hydrochlorothiazide (MAXZIDE) 75-50 MG tablet Take 1 tablet by mouth daily.     No facility-administered medications prior to visit.       Objective:   Physical Exam:  General appearance: 57 y.o., female, NAD, conversant  Eyes: anicteric sclerae; PERRL, tracking appropriately HENT: NCAT; MMM Neck: Trachea midline; no lymphadenopathy, no JVD Lungs: CTAB, no crackles, no wheeze, with normal respiratory effort CV: RRR, no murmur  Abdomen: Soft, non-tender; non-distended, BS present  Extremities: No peripheral edema, warm Skin: Normal turgor and texture; no rash Psych: Appropriate affect Neuro: Alert and oriented to person and place, no focal deficit     There were no vitals filed for this visit.   on *** LPM *** RA BMI Readings from Last 3 Encounters:  08/12/22 29.99 kg/m  07/14/22 29.79 kg/m  07/13/22 29.99 kg/m   Wt Readings from Last 3 Encounters:  08/12/22 215 lb (97.5 kg)  07/14/22 213 lb 9.6 oz (96.9 kg)  07/13/22 215 lb (97.5 kg)     CBC    Component Value Date/Time   WBC 5.0 08/12/2022 0912   RBC 4.36 08/12/2022 0912   HGB 12.6 08/12/2022 0912   HCT 39.4 08/12/2022 0912   PLT 384 08/12/2022 0912   MCV 90.4 08/12/2022 0912   MCH 28.9 08/12/2022 0912   MCHC 32.0 08/12/2022 0912   RDW 11.8 08/12/2022 0912   LYMPHSABS 1,470 08/12/2022 0912   MONOABS 0.6 02/04/2009 1005   EOSABS 555 (H) 08/12/2022 0912   BASOSABS 40 08/12/2022 0912    ***  Chest Imaging: ***  Pulmonary Functions Testing Results:     No data to display           Echocardiogram 07/18/22:    1. Left ventricular ejection fraction, by estimation, is 60 to 65%. The  left ventricle has normal function. The left ventricle has no regional  wall motion abnormalities. Left ventricular diastolic parameters were  normal.   2. Right ventricular systolic function is normal. The right  ventricular  size is normal.   3. The mitral valve is normal in structure. No evidence of mitral valve  regurgitation. No evidence of mitral stenosis.   4. The aortic valve is normal in structure. Aortic valve regurgitation is  not visualized. No aortic stenosis is present.   5. The inferior vena cava is normal in size with greater than 50%  respiratory variability, suggesting right atrial pressure of 3 mmHg.   Heart Catheterization: ***    Assessment & Plan:    Plan:      Omar Person, MD  Pulmonary Critical Care 09/26/2022 6:46 AM

## 2022-09-28 ENCOUNTER — Encounter: Payer: Self-pay | Admitting: Student

## 2022-09-28 ENCOUNTER — Ambulatory Visit (INDEPENDENT_AMBULATORY_CARE_PROVIDER_SITE_OTHER): Payer: Medicaid Other | Admitting: Student

## 2022-09-28 VITALS — BP 122/86 | HR 61 | Temp 97.9°F | Ht 71.0 in | Wt 209.6 lb

## 2022-09-28 DIAGNOSIS — R0600 Dyspnea, unspecified: Secondary | ICD-10-CM

## 2022-09-28 MED ORDER — BREZTRI AEROSPHERE 160-9-4.8 MCG/ACT IN AERO: 2.0000 | INHALATION_SPRAY | Freq: Two times a day (BID) | RESPIRATORY_TRACT | 0 refills | Status: DC

## 2022-09-28 NOTE — Patient Instructions (Signed)
-   PFT you'll be called to schedule. Need to be off of breztri, off of prednisone for 3 weeks before your PFTs.  - ok to try breztri 2 puff twice daily, need to rinse mouth, brush teeth/tongue after each use - see you in 2 months or sooner if need be

## 2022-09-28 NOTE — Therapy (Unsigned)
OUTPATIENT PHYSICAL THERAPY THORACOLUMBAR EVALUATION   Patient Name: Teresa Kent MRN: [id number] DOB:1966-03-10, 57 y.o., female Today's Date: 09/29/2022  END OF SESSION:  PT End of Session - 09/29/22 0931     Visit Number 1    Date for PT Re-Evaluation 11/24/22    PT Start Time 0930    PT Stop Time 1000    PT Time Calculation (min) 30 min             Past Medical History:  Diagnosis Date   Abnormal Pap smear 02/21/2003   Acne    Per records from [facility]   Acute sinusitis    Per Records from [facility]   Anemia    heavy menses   Arthritis    Per [facility] New Patient Packet   Cervicogenic headache    Per [facility] New Patient Packet   Colon cancer screening 03/10/2022   Per Records from [facility]   COVID    Per Records from [facility]   Elevated sed rate    Per records from [facility]   Elevated sed rate    Per Records from [facility]   Endometriosis 02/21/2004   Fever blister    Per records from [facility]   Fibroids, submucosal 02/21/2004   GERD (gastroesophageal reflux disease)    Per Records from [facility]   Goiter    Per records from [facility]   H/O: menorrhagia 02/21/2004   High cholesterol    Per [facility] New Patient Packet   Hx of mammogram 2024   Per [facility] New Patient Packet   Hypertension    Hypothyroidism    Idiopathic intracranial hypertension    Per Records from [facility]   Increased BMI 10/11/2010   Lower extremity edema    Per Records from [facility]   Morbid obesity [facility])    Per records from [facility]   Ovarian cyst, right 03/19/2004   Papanicolaou smear 09/2021   Per Records from [facility]   Postprocedural hypothyroidism    Per Records from [facility]   Simple endometrial hyperplasia 02/21/2004   Vitamin D deficiency    Per records from [facility]   Past Surgical History:  Procedure Laterality Date   DILATION AND CURETTAGE OF UTERUS   03/19/2004   DILITATION & CURRETTAGE/HYSTROSCOPY WITH NOVASURE ABLATION N/A 12/20/2012   Procedure: DILATATION & CURETTAGE/HYSTEROSCOPY WITH NOVASURE ABLATION;  Surgeon: Purcell Nails, MD;  Location: [facility];  Service: Gynecology;  Laterality: N/A;   OVARY SURGERY     resection of fibroid  03/19/2004   THYROID SURGERY     TUBAL LIGATION     Patient Active Problem List   Diagnosis Date Noted   Osteoarthritis of cervical spine 07/05/2022   Cervicogenic headache 07/05/2022   Chronic intractable headache 06/07/2022   Cervical stenosis of spinal canal 06/07/2022   IIH (idiopathic intracranial hypertension) 04/19/2022   Hidradenitis suppurativa 12/19/2011    PCP: Sharon Seller, NP  REFERRING PROVIDER: Estill Bamberg, MD  REFERRING DIAG: low back pain   Rationale for Evaluation and Treatment: Rehabilitation  THERAPY DIAG:  Other low back pain  Muscle weakness (generalized)  Difficulty in walking, not elsewhere classified  ONSET DATE: 09/19/22  SUBJECTIVE:  SUBJECTIVE STATEMENT: Patient reports that she has been told she has arthritis in her entire spine.  PERTINENT HISTORY:  Idiopathic Intracranial Hypertension and headaches,   PAIN:  Are you having pain? Yes: NPRS scale: 6/10 Pain location: low back, sometimes into hips Pain description: burning Aggravating factors: sitting Relieving factors: Tylenol, lying down  PRECAUTIONS: None  WEIGHT BEARING RESTRICTIONS: No  FALLS:  Has patient fallen in last 6 months? 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   PLOF: Independent  PATIENT GOALS: Decrease pain, increase her trunk  strength  NEXT MD VISIT: not yet scheduled  OBJECTIVE:   DIAGNOSTIC FINDINGS:  MRI 09/19/21 MPRESSION: S1 is a lumbarized vertebra.   Lumbar region degenerative disc disease and degenerative facet disease as outlined above, which could be a cause of lumbar region back pain. There is mild stenosis of the subarticular lateral recesses at L5-S1 and S1-S2, but no definite neural compression  SCREENING FOR RED FLAGS: Bowel or bladder incontinence: No Spinal tumors: No Cauda equina syndrome: No Compression fracture: No Abdominal aneurysm: No  COGNITION: Overall cognitive status: Within functional limits for tasks assessed     SENSATION: WFL  MUSCLE LENGTH: Hamstrings: Right 60 deg; Left 35 deg Thomas test: Mildly tight  POSTURE: increased lumbar lordosis  PALPATION: No TTP or muscle spasm noted.  LUMBAR ROM:   AROM eval  Flexion toes  Extension 80%  Right lateral flexion To knee  Left lateral flexion To knee  Right rotation 60%  Left rotation 60%   (Blank rows = not tested)  LOWER EXTREMITY ROM:   B hips are tight in all planes, especially ER and flex   LOWER EXTREMITY MMT:  (3+)-4-/5 in hips due to pain, otherwise 5/5  LUMBAR SPECIAL TESTS:  Straight leg raise test: Negative and Slump test: Negative Owestry-17/50, 35% disability  FUNCTIONAL TESTS:  5 times sit to stand: 18  GAIT: Distance walked: In clinic distances Assistive device utilized: None Level of assistance: Complete Independence Comments: Antalgic gait, cautious and slow, guarded.  TODAY'S TREATMENT:                                                                                                                              DATE:  09/29/22  Education Reviewed previous HEP program, identifying the trunk stability and stretches. Encouraged to perform them until next visit and assess tolerance.  PATIENT EDUCATION:  Education details: POC Person educated: Patient Education method:  Explanation Education comprehension: verbalized understanding  HOME EXERCISE PROGRAM: [id number] from previous therapy sessions  ASSESSMENT:  CLINICAL IMPRESSION: Patient is a 57 y.o. who was seen today for physical therapy evaluation and treatment for LBP. Patient with H/O arthritis throughout her spine. She is stiff in lower trunk and hips as well as weak in her core and hips, contributing to her  pain. She will benefit from PT to address these issues in order to give her more stability and manage the pain caused by her arthritis.   OBJECTIVE IMPAIRMENTS: decreased activity tolerance, decreased balance, decreased coordination, difficulty walking, decreased ROM, decreased strength, increased fascial restrictions, increased muscle spasms, impaired flexibility, improper body mechanics, postural dysfunction, and pain.   ACTIVITY LIMITATIONS: carrying, lifting, bending, standing, squatting, and locomotion level  PARTICIPATION LIMITATIONS: meal prep, cleaning, laundry, driving, shopping, community activity, and occupation  PERSONAL FACTORS: Past/current experiences are also affecting patient's functional outcome.   REHAB POTENTIAL: Good  CLINICAL DECISION MAKING: Stable/uncomplicated  EVALUATION COMPLEXITY: Low   GOALS: Goals reviewed with patient? Yes  SHORT TERM GOALS: Target date: 10/18/22  I with initial HEP Baseline: Reviewed previous program, patient to perform it and assess her tolerance. Goal status: INITIAL  LONG TERM GOALS: Target date: 11/24/22  I with final HEP Baseline:  Goal status: INITIAL  2.  Patient will score < 21% disability on Owestry to demosntrate decreased disability Baseline: 34%-mod Goal status: INITIAL  3.  Patient will complete 5 x STS in < 12 sec Baseline: 18 Goal status: INITIAL  4.  Patient will walk x at least 500', demonstrating normal gait mechanics, no guarding present. Baseline: 80', antalgic and guarded gait pattern. Goal status:  INITIAL  5.  Patient will tolerate her normal daily activities with LBP < 3/10 Baseline: 6 Goal status: INITIAL PLAN:  PT FREQUENCY: 1x/week  PT DURATION: 8 weeks  PLANNED INTERVENTIONS: Therapeutic exercises, Therapeutic activity, Neuromuscular re-education, Balance training, Gait training, Patient/Family education, Self Care, Joint mobilization, Dry Needling, Electrical stimulation, Spinal mobilization, Cryotherapy, Moist heat, Traction, Ionotophoresis 4mg /ml Dexamethasone, and Manual therapy.  PLAN FOR NEXT SESSION: How did she tolerate her previous HEP exercises? Note-patient is at 11v out of 25, try to minimize visits to preserve them.   Iona Beard, DPT 09/29/2022, 11:03 AM

## 2022-09-29 ENCOUNTER — Ambulatory Visit: Payer: Medicaid Other | Attending: Orthopedic Surgery | Admitting: Physical Therapy

## 2022-09-29 ENCOUNTER — Encounter: Payer: Self-pay | Admitting: Physical Therapy

## 2022-09-29 DIAGNOSIS — R278 Other lack of coordination: Secondary | ICD-10-CM | POA: Insufficient documentation

## 2022-09-29 DIAGNOSIS — M6281 Muscle weakness (generalized): Secondary | ICD-10-CM | POA: Diagnosis present

## 2022-09-29 DIAGNOSIS — M5459 Other low back pain: Secondary | ICD-10-CM | POA: Diagnosis present

## 2022-09-29 DIAGNOSIS — M542 Cervicalgia: Secondary | ICD-10-CM | POA: Diagnosis present

## 2022-09-29 DIAGNOSIS — M62838 Other muscle spasm: Secondary | ICD-10-CM | POA: Diagnosis present

## 2022-09-29 DIAGNOSIS — R262 Difficulty in walking, not elsewhere classified: Secondary | ICD-10-CM

## 2022-10-05 ENCOUNTER — Encounter: Payer: Self-pay | Admitting: Physical Therapy

## 2022-10-05 ENCOUNTER — Ambulatory Visit: Payer: Medicaid Other | Admitting: Physical Therapy

## 2022-10-05 DIAGNOSIS — M542 Cervicalgia: Secondary | ICD-10-CM

## 2022-10-05 DIAGNOSIS — M6281 Muscle weakness (generalized): Secondary | ICD-10-CM

## 2022-10-05 DIAGNOSIS — M5459 Other low back pain: Secondary | ICD-10-CM

## 2022-10-05 DIAGNOSIS — R262 Difficulty in walking, not elsewhere classified: Secondary | ICD-10-CM

## 2022-10-05 DIAGNOSIS — M62838 Other muscle spasm: Secondary | ICD-10-CM

## 2022-10-05 DIAGNOSIS — R278 Other lack of coordination: Secondary | ICD-10-CM

## 2022-10-05 NOTE — Therapy (Signed)
OUTPATIENT PHYSICAL THERAPY THORACOLUMBAR EVALUATION   Patient Name: Teresa Kent MRN: [id number] DOB:21-Feb-1966, 57 y.o., female Today's Date: 10/05/2022  END OF SESSION:  PT End of Session - 10/05/22 0932     Visit Number 2    Date for PT Re-Evaluation 11/24/22    PT Start Time 0930    PT Stop Time 1010    PT Time Calculation (min) 40 min    Activity Tolerance Patient limited by pain    Behavior During Therapy [facility] for tasks assessed/performed             Past Medical History:  Diagnosis Date   Abnormal Pap smear 02/21/2003   Acne    Per records from [facility]   Acute sinusitis    Per Records from [facility]   Anemia    heavy menses   Arthritis    Per [facility] New Patient Packet   Cervicogenic headache    Per [facility] New Patient Packet   Colon cancer screening 03/10/2022   Per Records from [facility]   COVID    Per Records from [facility]   Elevated sed rate    Per records from [facility]   Elevated sed rate    Per Records from [facility]   Endometriosis 02/21/2004   Fever blister    Per records from [facility]   Fibroids, submucosal 02/21/2004   GERD (gastroesophageal reflux disease)    Per Records from [facility]   Goiter    Per records from [facility]   H/O: menorrhagia 02/21/2004   High cholesterol    Per [facility] New Patient Packet   Hx of mammogram 2024   Per [facility] New Patient Packet   Hypertension    Hypothyroidism    Idiopathic intracranial hypertension    Per Records from [facility]   Increased BMI 10/11/2010   Lower extremity edema    Per Records from [facility]   Morbid obesity [facility])    Per records from [facility]   Ovarian cyst, right 03/19/2004   Papanicolaou smear 09/2021   Per Records from [facility]   Postprocedural hypothyroidism    Per Records from [facility]   Simple endometrial hyperplasia 02/21/2004   Vitamin D deficiency    Per records from [facility]   Past Surgical History:  Procedure Laterality Date   DILATION AND CURETTAGE OF UTERUS  03/19/2004   DILITATION & CURRETTAGE/HYSTROSCOPY WITH NOVASURE ABLATION N/A 12/20/2012   Procedure: DILATATION & CURETTAGE/HYSTEROSCOPY WITH NOVASURE ABLATION;  Surgeon: Purcell Nails, MD;  Location: [facility];  Service: Gynecology;  Laterality: N/A;   OVARY SURGERY     resection of fibroid  03/19/2004   THYROID SURGERY     TUBAL LIGATION     Patient Active Problem List   Diagnosis Date Noted   Osteoarthritis of cervical spine 07/05/2022   Cervicogenic headache 07/05/2022   Chronic intractable headache 06/07/2022   Cervical stenosis of spinal canal 06/07/2022   IIH (idiopathic intracranial hypertension) 04/19/2022   Hidradenitis suppurativa 12/19/2011    PCP: Sharon Seller, NP  REFERRING PROVIDER: Estill Bamberg, MD  REFERRING DIAG: low back pain   Rationale for Evaluation and Treatment: Rehabilitation  THERAPY DIAG:  Other low back pain  Muscle weakness (generalized)  Difficulty in walking, not elsewhere classified  Other muscle spasm  Other lack of coordination  Cervicalgia  ONSET DATE: 09/19/22  SUBJECTIVE:  SUBJECTIVE STATEMENT: Patient reports no new issues.  PERTINENT HISTORY:  Idiopathic Intracranial Hypertension and headaches,   PAIN:  Are you having pain? Yes: NPRS scale: 6/10 Pain location: low back, sometimes into hips Pain description: burning Aggravating factors: sitting Relieving factors: Tylenol, lying down  PRECAUTIONS: None  WEIGHT BEARING RESTRICTIONS: No  FALLS:  Has patient fallen in last 6 months? 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   PLOF: Independent  PATIENT GOALS: Decrease pain, increase her trunk strength  NEXT MD VISIT: not yet scheduled  OBJECTIVE:   DIAGNOSTIC FINDINGS:  MRI 09/19/21 MPRESSION: S1 is a lumbarized vertebra.   Lumbar region degenerative disc disease and degenerative facet disease as outlined above, which could be a cause of lumbar region back pain. There is mild stenosis of the subarticular lateral recesses at L5-S1 and S1-S2, but no definite neural compression  SCREENING FOR RED FLAGS: Bowel or bladder incontinence: No Spinal tumors: No Cauda equina syndrome: No Compression fracture: No Abdominal aneurysm: No  COGNITION: Overall cognitive status: Within functional limits for tasks assessed     SENSATION: WFL  MUSCLE LENGTH: Hamstrings: Right 60 deg; Left 35 deg Thomas test: Mildly tight  POSTURE: increased lumbar lordosis  PALPATION: No TTP or muscle spasm noted.  LUMBAR ROM:   AROM eval  Flexion toes  Extension 80%  Right lateral flexion To knee  Left lateral flexion To knee  Right rotation 60%  Left rotation 60%   (Blank rows = not tested)  LOWER EXTREMITY ROM:   B hips are tight in all planes, especially ER and flex   LOWER EXTREMITY MMT:  (3+)-4-/5 in hips due to pain, otherwise 5/5  LUMBAR SPECIAL TESTS:  Straight leg raise test: Negative and Slump test: Negative Owestry-17/50, 35% disability  FUNCTIONAL TESTS:  5 times sit to stand: 18  GAIT: Distance walked: In clinic distances Assistive device utilized: None Level of assistance: Complete Independence Comments: Antalgic gait, cautious and slow, guarded.  TODAY'S TREATMENT:                                                                                                                              DATE:  10/05/22 NuStep L5 x 6 minutes Supine stretch for low back- SKTC, DKTC, figure 4, glut stretch, LTR, 5 x 5 sec each Supine trunk and LE strength  and stability-Pelvic tilt, bridge, clamshells, Bride with resisted hip abd against G tband, SLR, SLR with hip ER, 5-10 reps of each exercise Standing mini squats with 1# weight in BUE to engage trunk, 10 reps. B side to side step x 10 each way.  09/29/22  Education Reviewed previous HEP program, identifying the trunk stability and stretches. Encouraged to perform them until next visit and assess tolerance.  PATIENT EDUCATION:  Education details: POC Person educated: Patient  Education method: Explanation Education comprehension: verbalized understanding  HOME EXERCISE PROGRAM: [id number] from previous therapy sessions  ASSESSMENT:  CLINICAL IMPRESSION: Patient reports no new issues. Updated HEP with additional stretching and strengthening for trunk and LE to improve her stability.  OBJECTIVE IMPAIRMENTS: decreased activity tolerance, decreased balance, decreased coordination, difficulty walking, decreased ROM, decreased strength, increased fascial restrictions, increased muscle spasms, impaired flexibility, improper body mechanics, postural dysfunction, and pain.   ACTIVITY LIMITATIONS: carrying, lifting, bending, standing, squatting, and locomotion level  PARTICIPATION LIMITATIONS: meal prep, cleaning, laundry, driving, shopping, community activity, and occupation  PERSONAL FACTORS: Past/current experiences are also affecting patient's functional outcome.   REHAB POTENTIAL: Good  CLINICAL DECISION MAKING: Stable/uncomplicated  EVALUATION COMPLEXITY: Low   GOALS: Goals reviewed with patient? Yes  SHORT TERM GOALS: Target date: 10/18/22  I with initial HEP Baseline: Reviewed previous program, patient to perform it and assess her tolerance. Goal status: 10/05/22-met  LONG TERM GOALS: Target date: 11/24/22  I with final HEP Baseline:  Goal status: INITIAL  2.  Patient will score < 21% disability on Owestry to demosntrate decreased disability Baseline: 34%-mod Goal status:  INITIAL  3.  Patient will complete 5 x STS in < 12 sec Baseline: 18 Goal status: INITIAL  4.  Patient will walk x at least 500', demonstrating normal gait mechanics, no guarding present. Baseline: 80', antalgic and guarded gait pattern. Goal status: 10/05/22-Still antalgic ongoing  5.  Patient will tolerate her normal daily activities with LBP < 3/10 Baseline: 6 Goal status: INITIAL PLAN:  PT FREQUENCY: 1x/week  PT DURATION: 8 weeks  PLANNED INTERVENTIONS: Therapeutic exercises, Therapeutic activity, Neuromuscular re-education, Balance training, Gait training, Patient/Family education, Self Care, Joint mobilization, Dry Needling, Electrical stimulation, Spinal mobilization, Cryotherapy, Moist heat, Traction, Ionotophoresis 4mg /ml Dexamethasone, and Manual therapy.  PLAN FOR NEXT SESSION: How did she tolerate her new HEP exercises? Try prone ext for back. Note-patient is at 11v out of 25, try to minimize visits to preserve them.   Iona Beard, DPT 10/05/2022, 10:14 AM

## 2022-10-06 ENCOUNTER — Other Ambulatory Visit: Payer: Self-pay

## 2022-10-06 ENCOUNTER — Emergency Department (HOSPITAL_BASED_OUTPATIENT_CLINIC_OR_DEPARTMENT_OTHER)
Admission: EM | Admit: 2022-10-06 | Discharge: 2022-10-06 | Disposition: A | Discharge status: Home / Self Care | Payer: Medicaid Other | Attending: Emergency Medicine | Admitting: Emergency Medicine

## 2022-10-06 ENCOUNTER — Encounter (HOSPITAL_BASED_OUTPATIENT_CLINIC_OR_DEPARTMENT_OTHER): Payer: Self-pay | Admitting: Pediatrics

## 2022-10-06 ENCOUNTER — Ambulatory Visit: Payer: Medicaid Other

## 2022-10-06 DIAGNOSIS — R519 Headache, unspecified: Secondary | ICD-10-CM | POA: Insufficient documentation

## 2022-10-06 LAB — CBC WITH DIFFERENTIAL/PLATELET
Abs Immature Granulocytes: 0.01 10*3/uL (ref 0.00–0.07)
Basophils Absolute: 0 10*3/uL (ref 0.0–0.1)
Basophils Relative: 1 %
Eosinophils Absolute: 0.7 10*3/uL — ABNORMAL HIGH (ref 0.0–0.5)
Eosinophils Relative: 16 %
HCT: 44.3 % (ref 36.0–46.0)
Hemoglobin: 14.2 g/dL (ref 12.0–15.0)
Immature Granulocytes: 0 %
Lymphocytes Relative: 36 %
Lymphs Abs: 1.6 10*3/uL (ref 0.7–4.0)
MCH: 28.7 pg (ref 26.0–34.0)
MCHC: 32.1 g/dL (ref 30.0–36.0)
MCV: 89.7 fL (ref 80.0–100.0)
Monocytes Absolute: 0.4 10*3/uL (ref 0.1–1.0)
Monocytes Relative: 9 %
Neutro Abs: 1.7 10*3/uL (ref 1.7–7.7)
Neutrophils Relative %: 38 %
Platelets: 303 10*3/uL (ref 150–400)
RBC: 4.94 MIL/uL (ref 3.87–5.11)
RDW: 11.9 % (ref 11.5–15.5)
WBC: 4.4 10*3/uL (ref 4.0–10.5)
nRBC: 0 % (ref 0.0–0.2)

## 2022-10-06 LAB — COMPREHENSIVE METABOLIC PANEL
ALT: 10 U/L (ref 0–44)
AST: 18 U/L (ref 15–41)
Albumin: 4.8 g/dL (ref 3.5–5.0)
Alkaline Phosphatase: 67 U/L (ref 38–126)
Anion gap: 15 (ref 5–15)
BUN: 7 mg/dL (ref 6–20)
CO2: 33 mmol/L — ABNORMAL HIGH (ref 22–32)
Calcium: 10.9 mg/dL — ABNORMAL HIGH (ref 8.9–10.3)
Chloride: 91 mmol/L — ABNORMAL LOW (ref 98–111)
Creatinine, Ser: 0.76 mg/dL (ref 0.44–1.00)
GFR, Estimated: >60 mL/min (ref >60)
Glucose, Bld: 90 mg/dL (ref 70–99)
Potassium: 3.5 mmol/L (ref 3.5–5.1)
Sodium: 139 mmol/L (ref 135–145)
Total Bilirubin: 1 mg/dL (ref 0.3–1.2)
Total Protein: 8.7 g/dL — ABNORMAL HIGH (ref 6.5–8.1)

## 2022-10-06 MED ORDER — DEXAMETHASONE SODIUM PHOSPHATE 10 MG/ML IJ SOLN
10.0000 mg | Freq: Once | INTRAMUSCULAR | Status: AC
  Administered 2022-10-06: 10 mg via INTRAVENOUS
  Filled 2022-10-06: qty 1

## 2022-10-06 MED ORDER — SODIUM CHLORIDE 0.9 % IV BOLUS
1000.0000 mL | Freq: Once | INTRAVENOUS | Status: AC
  Administered 2022-10-06: 1000 mL via INTRAVENOUS

## 2022-10-06 MED ORDER — ONDANSETRON HCL 4 MG/2ML IJ SOLN
4.0000 mg | Freq: Once | INTRAMUSCULAR | Status: DC
  Filled 2022-10-06: qty 2

## 2022-10-06 MED ORDER — PROCHLORPERAZINE EDISYLATE 10 MG/2ML IJ SOLN
10.0000 mg | Freq: Once | INTRAMUSCULAR | Status: AC
  Administered 2022-10-06: 10 mg via INTRAVENOUS
  Filled 2022-10-06: qty 2

## 2022-10-06 MED ORDER — DIPHENHYDRAMINE HCL 50 MG/ML IJ SOLN
12.5000 mg | Freq: Once | INTRAMUSCULAR | Status: AC
  Administered 2022-10-06: 12.5 mg via INTRAVENOUS
  Filled 2022-10-06: qty 1

## 2022-10-06 NOTE — ED Triage Notes (Signed)
C/O intermittent pains on bilateral temporal area, jaw and back of head along with some intermittent numbness. C/O burning sensation on both feet. Stated she had a cerebral angiogram on 05/01 and awaiting results. Denies any unilateral weakness and numbness.

## 2022-10-06 NOTE — ED Provider Notes (Signed)
EMERGENCY DEPARTMENT AT [facility] Provider Note   CSN: [id number] Arrival date & time: 10/06/22  1151     History Chief Complaint  Patient presents with   Headache    Teresa Kent is a 57 y.o. female.  Patient presents emergency department complaints of a headache.  Reports that headache is present in the bilateral temporal areas as well as the jaw and back of her head.  She has had some intermittent numbness with this.  Also reporting some associated burning sensation in her feet.  Reports that she had an IR venogram performed on 5/1 at [facility] and is still awaiting results.  Reports she contacted her neurology/radiology team and they advised patient to wait for follow-up on 5/20 as currently scheduled.  She has not been told of results of this IR procedure.  Currently denying any nausea, vomiting, vision changes, facial numbness or defects.   Headache      Home Medications Prior to Admission medications   Medication Sig Start Date End Date Taking? Authorizing Provider  acetaminophen (TYLENOL) 650 MG CR tablet Take 650 mg by mouth as needed for pain.    [provider]  Budeson-Glycopyrrol-Formoterol (BREZTRI AEROSPHERE) 160-9-4.8 MCG/ACT AERO Inhale 2 puffs into the lungs in the morning and at bedtime. 09/28/22   Omar Person, MD  ezetimibe (ZETIA) 10 MG tablet 1 tablet Orally Once a day for 30 days 08/15/22   Sharon Seller, NP  levothyroxine (SYNTHROID) 150 MCG tablet Take 150 mcg by mouth daily before breakfast.    [provider]  loratadine (CLARITIN) 10 MG tablet Take 10 mg by mouth 3 (three) times a week.    [provider]  triamterene-hydrochlorothiazide (MAXZIDE) 75-50 MG tablet Take 1 tablet by mouth daily. 06/27/22   [provider]      Allergies    Doxycycline, Prilosec [omeprazole], Simvastatin, and Vitamin d analogs    Review of Systems   Review of Systems  Neurological:  Positive for  headaches.  All other systems reviewed and are negative.   Physical Exam Updated Vital Signs BP (!) 152/90 (BP Location: Right Arm)   Pulse (!) 108   Temp (!) 97.3 F (36.3 C) (Oral)   Resp 18   Ht 5\' 11"  (1.803 m)   Wt 95.1 kg   LMP 10/31/2019   SpO2 100%   BMI 29.23 kg/m  Physical Exam Vitals and nursing note reviewed.  Constitutional:      General: She is not in acute distress.    Appearance: She is well-developed.  HENT:     Head: Normocephalic and atraumatic.  Eyes:     Extraocular Movements: Extraocular movements intact.     Right eye: Normal extraocular motion.     Left eye: Normal extraocular motion.     Conjunctiva/sclera: Conjunctivae normal.     Pupils: Pupils are equal, round, and reactive to light. Pupils are equal.  Cardiovascular:     Rate and Rhythm: Normal rate and regular rhythm.     Heart sounds: No murmur heard. Pulmonary:     Effort: Pulmonary effort is normal. No respiratory distress.     Breath sounds: Normal breath sounds.  Abdominal:     Palpations: Abdomen is soft.     Tenderness: There is no abdominal tenderness.  Musculoskeletal:        General: No swelling.     Cervical back: Normal range of motion and neck supple. No rigidity.  Skin:  General: Skin is warm and dry.     Capillary Refill: Capillary refill takes less than 2 seconds.  Neurological:     Mental Status: She is alert.     Cranial Nerves: No cranial nerve deficit, dysarthria or facial asymmetry.     Sensory: No sensory deficit.     Motor: No weakness.     Gait: Gait normal.  Psychiatric:        Mood and Affect: Mood normal.        Speech: Speech normal.     ED Results / Procedures / Treatments   Labs (all labs ordered are listed, but only abnormal results are displayed) Labs Reviewed  CBC WITH DIFFERENTIAL/PLATELET - Abnormal; Notable for the following components:      Result Value   Eosinophils Absolute 0.7 (*)    All other components within normal limits   COMPREHENSIVE METABOLIC PANEL - Abnormal; Notable for the following components:   Chloride 91 (*)    CO2 33 (*)    Calcium 10.9 (*)    Total Protein 8.7 (*)    All other components within normal limits    EKG None  Radiology No results found.  Procedures Procedures   Medications Ordered in ED Medications  ondansetron (ZOFRAN) injection 4 mg (has no administration in time range)  sodium chloride 0.9 % bolus 1,000 mL (1,000 mLs Intravenous New Bag/Given 10/06/22 1528)  prochlorperazine (COMPAZINE) injection 10 mg (10 mg Intravenous Given 10/06/22 1529)  diphenhydrAMINE (BENADRYL) injection 12.5 mg (12.5 mg Intravenous Given 10/06/22 1529)  dexamethasone (DECADRON) injection 10 mg (10 mg Intravenous Given 10/06/22 1529)    ED Course/ Medical Decision Making/ A&P Clinical Course as of 10/06/22 1641  Thu Oct 06, 2022  1518 Stable 57 YOF with acute on chronic headaches Follows with [facility] neurology. Had an angiography earlier this month that was nondiagnostic Treating in ER and reassessing for clinical stability.   [CC]    Clinical Course User Index [CC] Glyn Ade, MD                           Medical Decision Making Amount and/or Complexity of Data Reviewed Labs: ordered.   This patient presents to the ED for concern of headache. Differential diagnosis includes migraine, IIH, NPH, stroke, SAH   Lab Tests:  I Ordered, and personally interpreted labs.  The pertinent results include: CBC and CMP unremarkable   Medicines ordered and prescription drug management:  I ordered medication including fluids, Compazine, Decadron, Benadryl for migraine Reevaluation of the patient after these medicines showed that the patient improved I have reviewed the patients home medicines and have made adjustments as needed   Problem List / ED Course:  Patient with past medical history significant for suspected IIH, chronic intractable headache, cervical stenosis of spinal canal  presents emergency department complaints of headache.  After reviewing patient's chart, was able to find interventional radiology note from arteriography that was performed on 5/1.  Did not appear to be an acute diagnosis found that would account for patient's symptoms.  Given that IR did not appear to be acutely concerned for patient's symptoms although patient does not have results communicated to her, I believe that there is minimal concern for a more concerning condition as patient reach out to them today and was not advised to come to the emergency department.  Given lack of visual changes such as blurry vision, distortion of shapes and colors, I believe that  patient's symptoms are most consistent with her chronic intractable headache presentation.  Migraine cocktail administered.  Patient has symptomatic relief of headache.  Stable at this time for discharge.  Advised patient to return to the emergency department if symptoms are worsening.  Patient is agreeable to treatment plan verbalized understanding all return precautions.  Final Clinical Impression(s) / ED Diagnoses Final diagnoses:  Nonintractable episodic headache, unspecified headache type    Rx / DC Orders ED Discharge Orders     None         Smitty Knudsen, PA-C 10/06/22 1641    Tegeler, Canary Brim, MD 10/07/22 [phone]

## 2022-10-06 NOTE — Discharge Instructions (Signed)
You were seen in the emergency department for a headache. Labs were reassuring. Based on what I could see from the IR note from your angiography performed on 09/21/2022 with [facility], I could not see any acute concerns that would be causing your symptoms. Please keep your appointment on 10/10/2022 to receive further evaluation of your symptoms. If concerns for worsening symptoms, please return to the emergency department.

## 2022-10-06 NOTE — ED Notes (Signed)
Pt states she has had h/a with pains down both side of her head  with some numbness for a a week or 2, hasn't gotten better states get results of her angagram in a few days, it was done 5/1

## 2022-10-07 ENCOUNTER — Telehealth: Payer: Self-pay

## 2022-10-07 NOTE — Transitions of Care (Post Inpatient/ED Visit) (Signed)
   10/07/2022  Name: Teresa Kent MRN: [id number] DOB: July 22, 1965  Today's TOC FU Call Status: Today's TOC FU Call Status:: Successful TOC FU Call Competed TOC FU Call Complete Date: 10/07/22  Transition Care Management Follow-up Telephone Call Date of Discharge: 10/06/22 Discharge Facility: [facility]) Type of Discharge: Emergency Department Reason for ED Visit: Neurologic, Other: How have you been since you were released from the hospital?: Same Any questions or concerns?: Yes Patient Questions/Concerns:: medication patient recieved at hospital is causing her dizziness and tremors Patient Questions/Concerns Addressed: Notified Provider of Patient Questions/Concerns  Items Reviewed: Did you receive and understand the discharge instructions provided?: Yes Medications obtained,verified, and reconciled?: Yes (Medications Reviewed) Any new allergies since your discharge?: No Dietary orders reviewed?: NA Do you have support at home?: Yes  Medications Reviewed Today: Medications Reviewed Today     Reviewed by Iona Beard, PT (Physical Therapist) on 10/05/22 at 0930  Med List Status: <None>   Medication Order Taking? Sig Documenting Provider Last Dose Status Informant  acetaminophen (TYLENOL) 650 MG CR tablet [id number] No Take 650 mg by mouth as needed for pain. [provider] Taking Active   Budeson-Glycopyrrol-Formoterol (BREZTRI AEROSPHERE) 160-9-4.8 MCG/ACT AERO [id number]  Inhale 2 puffs into the lungs in the morning and at bedtime. Omar Person, MD  Active   ezetimibe (ZETIA) 10 MG tablet [id number]  1 tablet Orally Once a day for 30 days Sharon Seller, NP  Active   levothyroxine (SYNTHROID) 150 MCG tablet [id number] No Take 150 mcg by mouth daily before breakfast. [provider] Taking Active   loratadine (CLARITIN) 10 MG tablet [id number] No Take 10 mg by mouth 3 (three) times a week. [provider] Taking Active    triamterene-hydrochlorothiazide (MAXZIDE) 75-50 MG tablet [id number] No Take 1 tablet by mouth daily. [provider] Taking Active             Home Care and Equipment/Supplies: Were Home Health Services Ordered?: NA Any new equipment or medical supplies ordered?: NA  Functional Questionnaire: Do you need assistance with bathing/showering or dressing?: No Do you need assistance with meal preparation?: No Do you need assistance with eating?: No Do you have difficulty maintaining continence: No Do you need assistance with getting out of bed/getting out of a chair/moving?: No Do you have difficulty managing or taking your medications?: No  Follow up appointments reviewed: PCP Follow-up appointment confirmed?: Yes Date of PCP follow-up appointment?: 10/13/22 Follow-up Provider: Abbey Chatters, NP [facility] Follow-up appointment confirmed?: NA Do you need transportation to your follow-up appointment?: No Do you understand care options if your condition(s) worsen?: Yes-patient verbalized understanding    SIGNATURE: [facility]

## 2022-10-10 ENCOUNTER — Encounter: Payer: Self-pay | Admitting: Cardiology

## 2022-10-10 ENCOUNTER — Encounter: Payer: Self-pay | Admitting: Nurse Practitioner

## 2022-10-10 ENCOUNTER — Ambulatory Visit (INDEPENDENT_AMBULATORY_CARE_PROVIDER_SITE_OTHER): Payer: Medicaid Other | Admitting: Nurse Practitioner

## 2022-10-10 ENCOUNTER — Encounter: Payer: Self-pay | Admitting: Neurology

## 2022-10-10 VITALS — BP 122/74 | HR 62 | Temp 97.3°F | Ht 71.0 in | Wt 202.0 lb

## 2022-10-10 DIAGNOSIS — I10 Essential (primary) hypertension: Secondary | ICD-10-CM | POA: Diagnosis not present

## 2022-10-10 DIAGNOSIS — G932 Benign intracranial hypertension: Secondary | ICD-10-CM | POA: Diagnosis not present

## 2022-10-10 DIAGNOSIS — K219 Gastro-esophageal reflux disease without esophagitis: Secondary | ICD-10-CM

## 2022-10-10 DIAGNOSIS — E782 Mixed hyperlipidemia: Secondary | ICD-10-CM

## 2022-10-10 MED ORDER — EZETIMIBE 10 MG PO TABS
10.0000 mg | ORAL_TABLET | Freq: Every day | ORAL | 3 refills | Status: DC
Start: 2022-10-10 — End: 2023-01-31

## 2022-10-10 NOTE — Progress Notes (Signed)
Careteam: Patient Care Team: Sharon Seller, NP as PCP - General (Geriatric Medicine) Thomasene Ripple, DO as PCP - Cardiology (Cardiology) Kathi Der, MD as Consulting Physician (Gastroenterology) Osborn Coho, MD as Consulting Physician (Obstetrics and Gynecology) Thomasene Ripple, DO as Consulting Physician (Cardiology) Anson Fret, MD as Consulting Physician (Neurology) Lupita Leash, MD as Consulting Physician (Pulmonary Disease)  PLACE OF SERVICE:  [facility]  Advanced Directive information    Allergies  Allergen Reactions   Decadron [Dexamethasone] Other (See Comments)    Shaky    Doxycycline     Per records from [facility], worsening acne side effects    Prilosec [Omeprazole]     Chest pains   Simvastatin     Weakness, mind fog, and back pain    Vitamin D Analogs     Constipation with OTC products, Per records from [facility]    Chief Complaint  Patient presents with   Transitions Of Care    Follow-up from ED visit on 10/06/22 for nonintractable episodic headache, unspecified headache type. Patient states she is not taking some medications due to side effects/acid reflux      HPI: Patient is a 57 y.o. female for follow up.  She had IR venogram and has appt with her neurologist today at 5, reports diagnosed with intercranial hypertension- he had discussed stenting in the past.  Today has been a good day in regards to the headaches   She is not having any breathing issues- not taking breztri. Reports a lot of her issues from GERD- states she has a motility issues and placed her on carafate but burned her stomach.  She is taking tums and pepto which has been helpful.   Reports she is not taking zetia. States it was causing burning in her chest more so. Agreeable to restart  Review of Systems:  Review of Systems  Constitutional:  Negative for chills, fever and weight loss.  HENT:  Negative for tinnitus.   Respiratory:  Negative for  cough, sputum production and shortness of breath.   Cardiovascular:  Negative for chest pain, palpitations and leg swelling.  Gastrointestinal:  Positive for diarrhea and heartburn. Negative for abdominal pain and constipation.  Genitourinary:  Negative for dysuria, frequency and urgency.  Musculoskeletal:  Negative for back pain, falls, joint pain and myalgias.  Skin: Negative.   Neurological:  Positive for headaches. Negative for dizziness.  Psychiatric/Behavioral:  Negative for depression and memory loss. The patient does not have insomnia.     Past Medical History:  Diagnosis Date   Abnormal Pap smear 02/21/2003   Acne    Per records from [facility]   Acute sinusitis    Per Records from [facility]   Anemia    heavy menses   Arthritis    Per [facility] New Patient Packet   Cervicogenic headache    Per [facility] New Patient Packet   Colon cancer screening 03/10/2022   Per Records from [facility]   COVID    Per Records from [facility]   Elevated sed rate    Per records from [facility]   Elevated sed rate    Per Records from [facility]   Endometriosis 02/21/2004   Fever blister    Per records from [facility]   Fibroids, submucosal 02/21/2004   GERD (gastroesophageal reflux disease)    Per Records from [facility]   Goiter    Per records from [facility]   H/O: menorrhagia 02/21/2004  High cholesterol    Per [facility] New Patient Packet   Hx of mammogram 2024   Per [facility] New Patient Packet   Hypertension    Hypothyroidism    Idiopathic intracranial hypertension    Per Records from [facility]   Increased BMI 10/11/2010   Lower extremity edema    Per Records from [facility]   Morbid obesity [facility])    Per records from [facility]   Ovarian cyst, right 03/19/2004   Papanicolaou smear 09/2021   Per Records from [facility]   Postprocedural hypothyroidism    Per Records from [facility]   Simple endometrial  hyperplasia 02/21/2004   Vitamin D deficiency    Per records from [facility]   Past Surgical History:  Procedure Laterality Date   DILATION AND CURETTAGE OF UTERUS  03/19/2004   DILITATION & CURRETTAGE/HYSTROSCOPY WITH NOVASURE ABLATION N/A 12/20/2012   Procedure: DILATATION & CURETTAGE/HYSTEROSCOPY WITH NOVASURE ABLATION;  Surgeon: Purcell Nails, MD;  Location: [facility];  Service: Gynecology;  Laterality: N/A;   OVARY SURGERY     resection of fibroid  03/19/2004   THYROID SURGERY     TUBAL LIGATION     Social History:   reports that she has never smoked. She has never used smokeless tobacco. She reports that she does not drink alcohol and does not use drugs.  Family History  Problem Relation Age of Onset   Hypotension Mother    High blood pressure Mother        Per [facility] New Patient Packet   High Cholesterol Mother        Per [facility] New Patient Packet   Arthritis Mother        Per [facility] New Patient Packet   High blood pressure Brother        Per [facility] New Patient Packet   High Cholesterol Brother        Per [facility] New Patient Packet   High blood pressure Son        Per [facility] New Patient Packet   High Cholesterol Son        Per [facility] New Patient Packet   Thyroid disease Son        Per [facility] New Patient Packet   Mood Disorder Son        Per [facility] New Patient Packet   Autism Son        Per [facility] New Patient Packet   Cancer Maternal Aunt        Per [facility] New Patient Packet   Diabetes Maternal Aunt    Diabetes Maternal Uncle        Per [facility] New Patient Packet   Breast cancer Maternal Grandmother    Diabetes Maternal Grandmother    Stroke Maternal Grandmother        Per [facility] New Patient Packet   Arthritis Maternal Grandmother        Per [facility] New Patient Packet    Medications: [name] Medications  New Prescriptions   No medications on file  Previous Medications   ACETAMINOPHEN (TYLENOL) 650 MG CR TABLET    Take 650 mg by mouth as needed for pain.   BUDESON-GLYCOPYRROL-FORMOTEROL  (BREZTRI AEROSPHERE) 160-9-4.8 MCG/ACT AERO    Inhale 2 puffs into the lungs in the morning and at bedtime.   CALCIUM CARBONATE (TUMS EX) 750 MG CHEWABLE TABLET    Chew 1 tablet by mouth as needed for heartburn.   EZETIMIBE (ZETIA) 10 MG TABLET  1 tablet Orally Once a day for 30 days   LEVOTHYROXINE (SYNTHROID) 150 MCG TABLET    Take 150 mcg by mouth daily before breakfast.   LORATADINE (CLARITIN) 10 MG TABLET    Take 10 mg by mouth 3 (three) times a week.   TRIAMTERENE-HYDROCHLOROTHIAZIDE (MAXZIDE) 75-50 MG TABLET    Take 1 tablet by mouth daily.  Modified Medications   No medications on file  Discontinued Medications   No medications on file    Physical Exam:  Vitals:   10/10/22 1304  BP: 122/74  Pulse: 62  Temp: (!) 97.3 F (36.3 C)  TempSrc: Temporal  SpO2: 99%  Weight: 202 lb (91.6 kg)  Height: 5\' 11"  (1.803 m)   Body mass index is 28.17 kg/m. Wt Readings from Last 3 Encounters:  10/10/22 202 lb (91.6 kg)  10/06/22 209 lb 9.6 oz (95.1 kg)  09/28/22 209 lb 9.6 oz (95.1 kg)    Physical Exam Constitutional:      General: She is not in acute distress.    Appearance: She is well-developed. She is not diaphoretic.  HENT:     Head: Normocephalic and atraumatic.     Mouth/Throat:     Pharynx: No oropharyngeal exudate.  Eyes:     Conjunctiva/sclera: Conjunctivae normal.     Pupils: Pupils are equal, round, and reactive to light.  Cardiovascular:     Rate and Rhythm: Normal rate and regular rhythm.     Heart sounds: Normal heart sounds.  Pulmonary:     Effort: Pulmonary effort is normal.     Breath sounds: Normal breath sounds.  Abdominal:     General: Bowel sounds are normal.     Palpations: Abdomen is soft.  Musculoskeletal:     Cervical back: Normal range of motion and neck supple.     Right lower leg: No edema.     Left lower leg: No edema.  Skin:    General: Skin is warm and dry.  Neurological:     Mental Status: She is alert.  Psychiatric:        Mood  and Affect: Mood normal.     Labs reviewed: Basic Metabolic Panel: Recent Labs    04/12/22 2202 05/01/22 1112 05/16/22 1208 05/16/22 1208 06/10/22 0000 07/27/22 0000 08/12/22 0912 10/06/22 1354  NA  --    < > 141  --  142 140  140 140 139  K  --    < > 3.5  --  3.9 3.8  3.8 3.6 3.5  CL  --    < > 108  --  104 99  99 99 91*  CO2  --    < > 21*  --  32* 35*  35* 32 33*  GLUCOSE  --    < > 64*  --   --   --  88 90  BUN  --    < > 7  --  6 7  7 8 7   CREATININE  --    < > 0.76   < > 0.7 0.7  0.7 0.67 0.76  CALCIUM  --    < > 9.5  --  9.5 9.5  9.5 9.6 10.9*  TSH 0.928  --   --   --  2.63  --  0.89  --    < > = values in this interval not displayed.   Liver Function Tests: Recent Labs    06/10/22 0000 07/27/22 0000 08/12/22 0912 10/06/22 1354  AST 17  --  17 18  ALT 18  --  12 10  ALKPHOS 56  --   --  67  BILITOT  --   --  0.7 1.0  PROT  --   --  7.3 8.7*  ALBUMIN 3.8 3.9  3.9  --  4.8   No results for input(s): "LIPASE", "AMYLASE" in the last 8760 hours. No results for input(s): "AMMONIA" in the last 8760 hours. CBC: Recent Labs    05/16/22 1208 08/12/22 0912 10/06/22 1354  WBC 5.4 5.0 4.4  NEUTROABS  --  2,390 1.7  HGB 14.0 12.6 14.2  HCT 43.3 39.4 44.3  MCV 90.4 90.4 89.7  PLT 330 384 303   Lipid Panel: Recent Labs    06/10/22 0000 08/12/22 0912  CHOL 221* 243*  HDL 46 71  LDLCALC 157 152*  TRIG 103 90  CHOLHDL  --  3.4   TSH: Recent Labs    04/12/22 2202 06/10/22 0000 08/12/22 0912  TSH 0.928 2.63 0.89   A1C: No results found for: "HGBA1C"   Assessment/Plan 1. Mixed hyperlipidemia -agreeable to restart zetia, if she has side effects will consult lipid clinic - ezetimibe (ZETIA) 10 MG tablet; Take 1 tablet (10 mg total) by mouth daily.  Dispense: 90 tablet; Refill: 3  2. Intracranial hypertension Following with neurology/radiology team, has appt today at 5 to discuss treatment options  3. Gastroesophageal reflux disease  without esophagitis Ongoing, did not wish to take protonix, can use pepcid 10 mg BID Bland diet, advance as tolerates.  To stop tums due to elevated calcium   4. Primary hypertension -Blood pressure well controlled, goal bp <140/90 Continue current medications and dietary modifications follow metabolic panel  5. Serum calcium elevated Noted on recent labs, to stop tums and will recheck at follow up.   1 month follow up.  Janene Harvey. Biagio Borg [facility] [phone]

## 2022-10-10 NOTE — Patient Instructions (Addendum)
STOP TUMS  Can use pepcid, (Famotidine) 10 mg by mouth twice daily  To do bland diet.  Banana, rice, toast.  Plain chicken.

## 2022-10-12 ENCOUNTER — Encounter: Payer: Self-pay | Admitting: Neurology

## 2022-10-12 NOTE — Telephone Encounter (Signed)
Sent to Dr Lucia Gaskins. Patient has another open encounter.

## 2022-10-13 ENCOUNTER — Ambulatory Visit: Payer: Medicaid Other | Admitting: Physical Therapy

## 2022-10-13 ENCOUNTER — Encounter: Payer: Self-pay | Admitting: Physical Therapy

## 2022-10-13 ENCOUNTER — Other Ambulatory Visit: Payer: Self-pay | Admitting: Neurology

## 2022-10-13 DIAGNOSIS — M6281 Muscle weakness (generalized): Secondary | ICD-10-CM

## 2022-10-13 DIAGNOSIS — M5459 Other low back pain: Secondary | ICD-10-CM

## 2022-10-13 DIAGNOSIS — M542 Cervicalgia: Secondary | ICD-10-CM

## 2022-10-13 DIAGNOSIS — Z79899 Other long term (current) drug therapy: Secondary | ICD-10-CM

## 2022-10-13 DIAGNOSIS — M62838 Other muscle spasm: Secondary | ICD-10-CM

## 2022-10-13 DIAGNOSIS — R262 Difficulty in walking, not elsewhere classified: Secondary | ICD-10-CM

## 2022-10-13 DIAGNOSIS — R278 Other lack of coordination: Secondary | ICD-10-CM

## 2022-10-13 MED ORDER — ACETAZOLAMIDE 125 MG PO TABS
125.0000 mg | ORAL_TABLET | Freq: Two times a day (BID) | ORAL | 4 refills | Status: DC
Start: 2022-10-13 — End: 2022-11-15

## 2022-10-13 NOTE — Therapy (Signed)
OUTPATIENT PHYSICAL THERAPY THORACOLUMBAR EVALUATION   Patient Name: Teresa Kent MRN: [id number] DOB:June 25, 1965, 57 y.o., female Today's Date: 10/13/2022  END OF SESSION:  PT End of Session - 10/13/22 1016     Visit Number 3    Date for PT Re-Evaluation 11/24/22    PT Start Time 1015    PT Stop Time 1055    PT Time Calculation (min) 40 min    Activity Tolerance Patient limited by pain    Behavior During Therapy [facility] for tasks assessed/performed              Past Medical History:  Diagnosis Date   Abnormal Pap smear 02/21/2003   Acne    Per records from [facility]   Acute sinusitis    Per Records from [facility]   Anemia    heavy menses   Arthritis    Per [facility] New Patient Packet   Cervicogenic headache    Per [facility] New Patient Packet   Colon cancer screening 03/10/2022   Per Records from [facility]   COVID    Per Records from [facility]   Elevated sed rate    Per records from [facility]   Elevated sed rate    Per Records from [facility]   Endometriosis 02/21/2004   Fever blister    Per records from [facility]   Fibroids, submucosal 02/21/2004   GERD (gastroesophageal reflux disease)    Per Records from [facility]   Goiter    Per records from [facility]   H/O: menorrhagia 02/21/2004   High cholesterol    Per [facility] New Patient Packet   Hx of mammogram 2024   Per [facility] New Patient Packet   Hypertension    Hypothyroidism    Idiopathic intracranial hypertension    Per Records from [facility]   Increased BMI 10/11/2010   Lower extremity edema    Per Records from [facility]   Morbid obesity [facility])    Per records from [facility]   Ovarian cyst, right 03/19/2004   Papanicolaou smear 09/2021   Per Records from [facility]   Postprocedural hypothyroidism    Per Records from [facility]   Simple endometrial hyperplasia 02/21/2004   Vitamin D deficiency    Per records from [facility]  [facility]   Past Surgical History:  Procedure Laterality Date   DILATION AND CURETTAGE OF UTERUS  03/19/2004   DILITATION & CURRETTAGE/HYSTROSCOPY WITH NOVASURE ABLATION N/A 12/20/2012   Procedure: DILATATION & CURETTAGE/HYSTEROSCOPY WITH NOVASURE ABLATION;  Surgeon: Purcell Nails, MD;  Location: [facility];  Service: Gynecology;  Laterality: N/A;   OVARY SURGERY     resection of fibroid  03/19/2004   THYROID SURGERY     TUBAL LIGATION     Patient Active Problem List   Diagnosis Date Noted   Osteoarthritis of cervical spine 07/05/2022   Cervicogenic headache 07/05/2022   Chronic intractable headache 06/07/2022   Cervical stenosis of spinal canal 06/07/2022   IIH (idiopathic intracranial hypertension) 04/19/2022   Hidradenitis suppurativa 12/19/2011    PCP: Sharon Seller, NP  REFERRING PROVIDER: Estill Bamberg, MD  REFERRING DIAG: low back pain   Rationale for Evaluation and Treatment: Rehabilitation  THERAPY DIAG:  Other low back pain  Muscle weakness (generalized)  Difficulty in walking, not elsewhere classified  Other muscle spasm  Other lack of coordination  Cervicalgia  ONSET DATE: 09/19/22  SUBJECTIVE:  SUBJECTIVE STATEMENT: Patient reports no new problems. She was definitively diagnosed with ICH, told to go back on her medication. She is getting back injections on 6/5.   PERTINENT HISTORY:  Idiopathic Intracranial Hypertension and headaches,   PAIN:  Are you having pain? Yes: NPRS scale: 6/10 Pain location: low back, sometimes into hips Pain description: burning Aggravating factors: sitting Relieving factors: Tylenol, lying down  PRECAUTIONS: None  WEIGHT BEARING RESTRICTIONS: No  FALLS:  Has patient fallen in last 6 months? 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   PLOF: Independent  PATIENT GOALS: Decrease pain, increase her trunk strength  NEXT MD VISIT: not yet scheduled  OBJECTIVE:   DIAGNOSTIC FINDINGS:  MRI 09/19/21 MPRESSION: S1 is a lumbarized vertebra.   Lumbar region degenerative disc disease and degenerative facet disease as outlined above, which could be a cause of lumbar region back pain. There is mild stenosis of the subarticular lateral recesses at L5-S1 and S1-S2, but no definite neural compression  SCREENING FOR RED FLAGS: Bowel or bladder incontinence: No Spinal tumors: No Cauda equina syndrome: No Compression fracture: No Abdominal aneurysm: No  COGNITION: Overall cognitive status: Within functional limits for tasks assessed     SENSATION: WFL  MUSCLE LENGTH: Hamstrings: Right 60 deg; Left 35 deg Thomas test: Mildly tight  POSTURE: increased lumbar lordosis  PALPATION: No TTP or muscle spasm noted.  LUMBAR ROM:   AROM eval  Flexion toes  Extension 80%  Right lateral flexion To knee  Left lateral flexion To knee  Right rotation 60%  Left rotation 60%   (Blank rows = not tested)  LOWER EXTREMITY ROM:   B hips are tight in all planes, especially ER and flex   LOWER EXTREMITY MMT:  (3+)-4-/5 in hips due to pain, otherwise 5/5  LUMBAR SPECIAL TESTS:  Straight leg raise test: Negative and Slump test: Negative Owestry-17/50, 35% disability  FUNCTIONAL TESTS:  5 times sit to stand: 18  GAIT: Distance walked: In clinic distances Assistive device utilized: None Level of assistance: Complete Independence Comments: Antalgic gait, cautious and slow, guarded.  TODAY'S TREATMENT:                                                                                                                              DATE:  10/13/22 NuStep L5 x 6  min. Prone on elbows 2 x 30 sec, press ups were too much and caused pain. Supine with legs over physioball-stretching, LTR, DKTC, figure 4, glut stretch Supine over physioball-strength, quick side to side roll, bridge, bridge with hip IR, alternating leg lifts off the ball while stabilizing with the opposite leg. Supine crunch with physioball x 10 Seated forward flexion over physioball, 5 x 10 seconds Step ups on 4" step,forward, side step, crossover, x 10 each  10/05/22 NuStep  L5 x 6 minutes Supine stretch for low back- SKTC, DKTC, figure 4, glut stretch, LTR, 5 x 5 sec each Supine trunk and LE strength and stability-Pelvic tilt, bridge, clamshells, Bride with resisted hip abd against G tband, SLR, SLR with hip ER, 5-10 reps of each exercise Standing mini squats with 1# weight in BUE to engage trunk, 10 reps. B side to side step x 10 each way.  09/29/22  Education Reviewed previous HEP program, identifying the trunk stability and stretches. Encouraged to perform them until next visit and assess tolerance.  PATIENT EDUCATION:  Education details: POC Person educated: Patient Education method: Explanation Education comprehension: verbalized understanding  HOME EXERCISE PROGRAM: [id number] from previous therapy sessions  ASSESSMENT:  CLINICAL IMPRESSION: Patient reports no new issues. HEP seems to help. She was definitively diagnosed with ICH, will try to control it with medication. Introduced prone on elbows for back pain relief. Then introduced stretching and stabilization using physioball. Strengthening was challenging due to decreased trunk stability, but she got through all exercises.  OBJECTIVE IMPAIRMENTS: decreased activity tolerance, decreased balance, decreased coordination, difficulty walking, decreased ROM, decreased strength, increased fascial restrictions, increased muscle spasms, impaired flexibility, improper body mechanics, postural dysfunction, and pain.   ACTIVITY  LIMITATIONS: carrying, lifting, bending, standing, squatting, and locomotion level  PARTICIPATION LIMITATIONS: meal prep, cleaning, laundry, driving, shopping, community activity, and occupation  PERSONAL FACTORS: Past/current experiences are also affecting patient's functional outcome.   REHAB POTENTIAL: Good  CLINICAL DECISION MAKING: Stable/uncomplicated  EVALUATION COMPLEXITY: Low   GOALS: Goals reviewed with patient? Yes  SHORT TERM GOALS: Target date: 10/18/22  I with initial HEP Baseline: Reviewed previous program, patient to perform it and assess her tolerance. Goal status: 10/05/22-met  LONG TERM GOALS: Target date: 11/24/22  I with final HEP Baseline:  Goal status: INITIAL  2.  Patient will score < 21% disability on Owestry to demosntrate decreased disability Baseline: 34%-mod Goal status: INITIAL  3.  Patient will complete 5 x STS in < 12 sec Baseline: 18 Goal status: INITIAL  4.  Patient will walk x at least 500', demonstrating normal gait mechanics, no guarding present. Baseline: 80', antalgic and guarded gait pattern. Goal status: 10/05/22-Still antalgic ongoing  5.  Patient will tolerate her normal daily activities with LBP < 3/10 Baseline: 6 Goal status: INITIAL PLAN:  PT FREQUENCY: 1x/week  PT DURATION: 8 weeks  PLANNED INTERVENTIONS: Therapeutic exercises, Therapeutic activity, Neuromuscular re-education, Balance training, Gait training, Patient/Family education, Self Care, Joint mobilization, Dry Needling, Electrical stimulation, Spinal mobilization, Cryotherapy, Moist heat, Traction, Ionotophoresis 4mg /ml Dexamethasone, and Manual therapy.  PLAN FOR NEXT SESSION: How did she tolerate her new HEP exercises? Try prone ext for back. Note-patient is at 11v out of 25, try to minimize visits to preserve them.   Iona Beard, DPT 10/13/2022, 10:54 AM

## 2022-10-14 LAB — HM PAP SMEAR: HPV, high-risk: NEGATIVE

## 2022-10-21 ENCOUNTER — Encounter: Payer: Self-pay | Admitting: Physical Therapy

## 2022-10-21 ENCOUNTER — Ambulatory Visit: Payer: Medicaid Other | Admitting: Physical Therapy

## 2022-10-21 DIAGNOSIS — M5459 Other low back pain: Secondary | ICD-10-CM

## 2022-10-21 DIAGNOSIS — M6281 Muscle weakness (generalized): Secondary | ICD-10-CM

## 2022-10-21 DIAGNOSIS — R262 Difficulty in walking, not elsewhere classified: Secondary | ICD-10-CM

## 2022-10-21 DIAGNOSIS — M62838 Other muscle spasm: Secondary | ICD-10-CM

## 2022-10-21 NOTE — Therapy (Signed)
OUTPATIENT PHYSICAL THERAPY THORACOLUMBAR EVALUATION  PHYSICAL THERAPY DISCHARGE SUMMARY  Visits from Start of Care: 4  Current functional level related to goals / functional outcomes: Goals partially met   Remaining deficits: Patient reports that her LBP is resolved, but her neck and upper back pain have returned.   Education / Equipment: HEP   Patient agrees to discharge. Patient goals were partially met. Patient is being discharged due to maximized rehab potential.    Patient Name: Teresa Kent MRN: [id number] DOB:Jun 30, 1965, 57 y.o., female Today's Date: 10/21/2022  END OF SESSION:  PT End of Session - 10/21/22 0847     Visit Number 4    Date for PT Re-Evaluation 11/24/22    PT Start Time 0844    PT Stop Time 0907    PT Time Calculation (min) 23 min    Activity Tolerance Patient limited by pain    Behavior During Therapy [facility] for tasks assessed/performed              Past Medical History:  Diagnosis Date   Abnormal Pap smear 02/21/2003   Acne    Per records from [facility]   Acute sinusitis    Per Records from [facility]   Anemia    heavy menses   Arthritis    Per [facility] New Patient Packet   Cervicogenic headache    Per [facility] New Patient Packet   Colon cancer screening 03/10/2022   Per Records from [facility]   COVID    Per Records from [facility]   Elevated sed rate    Per records from [facility]   Elevated sed rate    Per Records from [facility]   Endometriosis 02/21/2004   Fever blister    Per records from [facility]   Fibroids, submucosal 02/21/2004   GERD (gastroesophageal reflux disease)    Per Records from [facility]   Goiter    Per records from [facility]   H/O: menorrhagia 02/21/2004   High cholesterol    Per [facility] New Patient Packet   Hx of mammogram 2024   Per [facility] New Patient Packet   Hypertension    Hypothyroidism    Idiopathic intracranial hypertension    Per Records from  [facility]   Increased BMI 10/11/2010   Lower extremity edema    Per Records from [facility]   Morbid obesity [facility])    Per records from [facility]   Ovarian cyst, right 03/19/2004   Papanicolaou smear 09/2021   Per Records from [facility]   Postprocedural hypothyroidism    Per Records from [facility]   Simple endometrial hyperplasia 02/21/2004   Vitamin D deficiency    Per records from [facility]   Past Surgical History:  Procedure Laterality Date   DILATION AND CURETTAGE OF UTERUS  03/19/2004   DILITATION & CURRETTAGE/HYSTROSCOPY WITH NOVASURE ABLATION N/A 12/20/2012   Procedure: DILATATION & CURETTAGE/HYSTEROSCOPY WITH NOVASURE ABLATION;  Surgeon: Purcell Nails, MD;  Location: [facility];  Service: Gynecology;  Laterality: N/A;   OVARY SURGERY     resection of fibroid  03/19/2004   THYROID SURGERY     TUBAL LIGATION     Patient Active Problem List   Diagnosis Date Noted   Osteoarthritis of cervical spine 07/05/2022   Cervicogenic headache 07/05/2022   Chronic intractable headache 06/07/2022   Cervical stenosis of spinal canal 06/07/2022   IIH (idiopathic intracranial hypertension) 04/19/2022   Hidradenitis suppurativa 12/19/2011    PCP:  Sharon Seller, NP  REFERRING PROVIDER: Estill Bamberg, MD  REFERRING DIAG: low back pain   Rationale for Evaluation and Treatment: Rehabilitation  THERAPY DIAG:  Other low back pain  Muscle weakness (generalized)  Difficulty in walking, not elsewhere classified  Other muscle spasm  ONSET DATE: 09/19/22  SUBJECTIVE:                                                                                                                                                                                           SUBJECTIVE STATEMENT: Patient reports no new problems. She was definitively diagnosed with ICH, told to go back on her medication. She is getting back injections on 6/5.   PERTINENT HISTORY:   Idiopathic Intracranial Hypertension and headaches,   PAIN:  Are you having pain? Yes: NPRS scale: 6/10 Pain location: low back, sometimes into hips Pain description: burning Aggravating factors: sitting Relieving factors: Tylenol, lying down  PRECAUTIONS: None  WEIGHT BEARING RESTRICTIONS: No  FALLS:  Has patient fallen in last 6 months? 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   PLOF: Independent  PATIENT GOALS: Decrease pain, increase her trunk strength  NEXT MD VISIT: not yet scheduled  OBJECTIVE:   DIAGNOSTIC FINDINGS:  MRI 09/19/21 MPRESSION: S1 is a lumbarized vertebra.   Lumbar region degenerative disc disease and degenerative facet disease as outlined above, which could be a cause of lumbar region back pain. There is mild stenosis of the subarticular lateral recesses at L5-S1 and S1-S2, but no definite neural compression  SCREENING FOR RED FLAGS: Bowel or bladder incontinence: No Spinal tumors: No Cauda equina syndrome: No Compression fracture: No Abdominal aneurysm: No  COGNITION: Overall cognitive status: Within functional limits for tasks assessed     SENSATION: WFL  MUSCLE LENGTH: Hamstrings: Right 60 deg; Left 35 deg Thomas test: Mildly tight  POSTURE: increased lumbar lordosis  PALPATION: No TTP or muscle spasm noted.  LUMBAR ROM:   AROM eval  Flexion toes  Extension 80%  Right lateral flexion To knee  Left lateral flexion To knee  Right rotation 60%  Left rotation 60%   (Blank rows = not tested)  LOWER EXTREMITY ROM:   B hips are tight in all planes, especially ER and flex   LOWER EXTREMITY MMT:  (3+)-4-/5 in hips due to pain, otherwise 5/5  LUMBAR SPECIAL TESTS:  Straight leg raise test: Negative and Slump test: Negative Owestry-17/50, 35% disability  FUNCTIONAL  TESTS:  5 times sit to stand: 18  GAIT: Distance walked: In clinic distances Assistive device utilized: None Level of assistance: Complete Independence Comments: Antalgic gait, cautious and slow, guarded.  TODAY'S TREATMENT:                                                                                                                              DATE:  10/21/22 NuStep L5 x 6 minutes Re-assessed goals for D/C.  10/13/22 NuStep L5 x 6 min. Prone on elbows 2 x 30 sec, press ups were too much and caused pain. Supine with legs over physioball-stretching, LTR, DKTC, figure 4, glut stretch Supine over physioball-strength, quick side to side roll, bridge, bridge with hip IR, alternating leg lifts off the ball while stabilizing with the opposite leg. Supine crunch with physioball x 10 Seated forward flexion over physioball, 5 x 10 seconds Step ups on 4" step,forward, side step, crossover, x 10 each  10/05/22 NuStep L5 x 6 minutes Supine stretch for low back- SKTC, DKTC, figure 4, glut stretch, LTR, 5 x 5 sec each Supine trunk and LE strength and stability-Pelvic tilt, bridge, clamshells, Bride with resisted hip abd against G tband, SLR, SLR with hip ER, 5-10 reps of each exercise Standing mini squats with 1# weight in BUE to engage trunk, 10 reps. B side to side step x 10 each way.  09/29/22  Education Reviewed previous HEP program, identifying the trunk stability and stretches. Encouraged to perform them until next visit and assess tolerance.  PATIENT EDUCATION:  Education details: POC Person educated: Patient Education method: Explanation Education comprehension: verbalized understanding  HOME EXERCISE PROGRAM: [id number]   ASSESSMENT:  CLINICAL IMPRESSION: Patient reports that her back pain has resolved, but her neck and upper shoulder pain and pressure have returned. She spends a lot of time lying down due to starting back on her one medication. She has extensive HEP to address  stretching and strengthening for upper and lower back to address pain and stiffness.  OBJECTIVE IMPAIRMENTS: decreased activity tolerance, decreased balance, decreased coordination, difficulty walking, decreased ROM, decreased strength, increased fascial restrictions, increased muscle spasms, impaired flexibility, improper body mechanics, postural dysfunction, and pain.   ACTIVITY LIMITATIONS: carrying, lifting, bending, standing, squatting, and locomotion level  PARTICIPATION LIMITATIONS: meal prep, cleaning, laundry, driving, shopping, community activity, and occupation  PERSONAL FACTORS: Past/current experiences are also affecting patient's functional outcome.   REHAB POTENTIAL: Good  CLINICAL DECISION MAKING: Stable/uncomplicated  EVALUATION COMPLEXITY: Low   GOALS: Goals reviewed with patient? Yes  SHORT TERM GOALS: Target date: 10/18/22  I with initial HEP Baseline: Reviewed previous program, patient to perform it and assess her tolerance. Goal status: 10/05/22-met  LONG TERM GOALS: Target date: 11/24/22  I with final HEP Baseline:  Goal status: met  2.  Patient will score < 21% disability on Owestry to demosntrate decreased disability Baseline: 34%-mod Goal status: 53124-52%-Primarily due to upper back/neck pain. Not met  3.  Patient will complete 5 x STS in < 12 sec  Baseline: 18 Goal status: 16, 10/21/22 not met  4.  Patient will walk x at least 500', demonstrating normal gait mechanics, no guarding present. Baseline: 80', antalgic and guarded gait pattern. Goal status: 10/21/22-Normalized gait pattern, patient reports walking up to 30 minutes. met  5.  Patient will tolerate her normal daily activities with LBP < 3/10 Baseline: 6 Goal status: 10/21/22-met-Patient reports LBP resolved. PLAN:  PT FREQUENCY: 1x/week  PT DURATION: 8 weeks  PLANNED INTERVENTIONS: Therapeutic exercises, Therapeutic activity, Neuromuscular re-education, Balance training, Gait training,  Patient/Family education, Self Care, Joint mobilization, Dry Needling, Electrical stimulation, Spinal mobilization, Cryotherapy, Moist heat, Traction, Ionotophoresis 4mg /ml Dexamethasone, and Manual therapy.  PLAN FOR NEXT SESSION: How did she tolerate her new HEP exercises? Try prone ext for back. Note-patient is at 11v out of 25, try to minimize visits to preserve them.   Iona Beard, DPT 10/21/2022, 9:11 AM

## 2022-10-26 ENCOUNTER — Ambulatory Visit: Payer: Medicaid Other | Admitting: Physical Therapy

## 2022-11-02 ENCOUNTER — Other Ambulatory Visit (INDEPENDENT_AMBULATORY_CARE_PROVIDER_SITE_OTHER): Payer: Self-pay

## 2022-11-02 DIAGNOSIS — Z0289 Encounter for other administrative examinations: Secondary | ICD-10-CM

## 2022-11-02 DIAGNOSIS — Z79899 Other long term (current) drug therapy: Secondary | ICD-10-CM

## 2022-11-03 ENCOUNTER — Ambulatory Visit: Payer: Medicaid Other | Admitting: Physical Therapy

## 2022-11-03 LAB — CBC WITH DIFFERENTIAL/PLATELET
Basos: 1 %
Hematocrit: 41.3 % (ref 34.0–46.6)
Immature Grans (Abs): 0 10*3/uL (ref 0.0–0.1)
Lymphocytes Absolute: 1.9 10*3/uL (ref 0.7–3.1)
MCH: 28.4 pg (ref 26.6–33.0)
MCHC: 31.7 g/dL (ref 31.5–35.7)
Neutrophils: 41 %
Platelets: 309 10*3/uL (ref 150–450)
RBC: 4.62 x10E6/uL (ref 3.77–5.28)
WBC: 4.5 10*3/uL (ref 3.4–10.8)

## 2022-11-03 LAB — COMPREHENSIVE METABOLIC PANEL
ALT: 13 IU/L (ref 0–32)
Albumin/Globulin Ratio: 1.3
CO2: 25 mmol/L (ref 20–29)
Globulin, Total: 2.9 g/dL (ref 1.5–4.5)
Glucose: 83 mg/dL (ref 70–99)
Potassium: 3.8 mmol/L (ref 3.5–5.2)
Total Protein: 6.8 g/dL (ref 6.0–8.5)

## 2022-11-07 ENCOUNTER — Ambulatory Visit: Payer: Medicaid Other | Admitting: Nurse Practitioner

## 2022-11-09 ENCOUNTER — Ambulatory Visit: Payer: Medicaid Other | Admitting: Physical Therapy

## 2022-11-11 ENCOUNTER — Encounter: Payer: Medicaid Other | Admitting: Nurse Practitioner

## 2022-11-14 ENCOUNTER — Encounter: Payer: Self-pay | Admitting: Neurology

## 2022-11-14 ENCOUNTER — Ambulatory Visit: Payer: Medicaid Other | Admitting: Neurology

## 2022-11-14 VITALS — BP 116/75 | HR 59 | Ht 71.0 in | Wt 204.0 lb

## 2022-11-14 DIAGNOSIS — G932 Benign intracranial hypertension: Secondary | ICD-10-CM

## 2022-11-14 DIAGNOSIS — H9313 Tinnitus, bilateral: Secondary | ICD-10-CM

## 2022-11-14 DIAGNOSIS — M5030 Other cervical disc degeneration, unspecified cervical region: Secondary | ICD-10-CM | POA: Diagnosis not present

## 2022-11-14 LAB — CBC WITH DIFFERENTIAL/PLATELET
Basophils Absolute: 0.1 10*3/uL (ref 0.0–0.2)
EOS (ABSOLUTE): 0.2 10*3/uL (ref 0.0–0.4)
Eos: 5 %
Hemoglobin: 13.1 g/dL (ref 11.1–15.9)
Immature Granulocytes: 0 %
Lymphs: 44 %
MCV: 89 fL (ref 79–97)
Monocytes Absolute: 0.4 10*3/uL (ref 0.1–0.9)
Monocytes: 9 %
Neutrophils Absolute: 1.9 10*3/uL (ref 1.4–7.0)
RDW: 11.8 % (ref 11.7–15.4)

## 2022-11-14 LAB — COMPREHENSIVE METABOLIC PANEL
AST: 13 IU/L (ref 0–40)
Albumin: 3.9 g/dL (ref 3.8–4.9)
Alkaline Phosphatase: 65 IU/L (ref 44–121)
BUN/Creatinine Ratio: 11 (ref 9–23)
BUN: 9 mg/dL (ref 6–24)
Bilirubin Total: 1 mg/dL (ref 0.0–1.2)
Calcium: 9.2 mg/dL (ref 8.7–10.2)
Chloride: 106 mmol/L (ref 96–106)
Creatinine, Ser: 0.85 mg/dL (ref 0.57–1.00)
Sodium: 144 mmol/L (ref 134–144)
eGFR: 81 mL/min/{1.73_m2} (ref >59)

## 2022-11-14 LAB — ACETAZOLAMIDE, (DIAMOX), S/P: Acetazolamide: <0.5 ug/ml — ABNORMAL LOW

## 2022-11-14 NOTE — Progress Notes (Signed)
err

## 2022-11-14 NOTE — Progress Notes (Unsigned)
Chief Complaint  Patient presents with   Follow-up    Pt in 57  Pt here for IIH/Migraine f/u  Pt states having hives from diamox  Pt states currently taking diamox every other day     HISTORY OF PRESENT ILLNESS:  11/14/2022: She saw Dr. Kelby Kent, had cerbral angiography, offered to stent which would decrease tinnitus but would not affect her possible IDIOPATHIC INTRACRANIAL HYPERTENSION. She doesn't have any headaches any more. She takes one 125mg  diamox every other day because she had hives. She declines topamax or other options. She saw dr Teresa Kent and no papilledema per practitioner (we will request notes). For tha past few days she Is asymptomatic, she has neck spams, an injection in her back, tinnitus on the right and left still there. No changes in vision, she wears bifocals, she has also lost weight which may be helping. She will continue what she is doing, stay active, keep losing weight and clinically monitor with neurology and ophthalmology. I do not recommend taking diamox because of the hives, but she has been [employer] as needed without a reaction (only had hives when took it straight for a week). Watrned again anaphylaxis, she understands. For any  reaction especially breathing call 911 may not have even been the diamox bc no reaction when takes it as needed. See dr Teresa Kent as needed. Follow up with Dr. Jake Samples for neck and dry needling.   Patient complains of symptoms per HPI as well as the following symptoms: none . Pertinent negatives and positives per HPI. All others negative   Reviewed Dr. Wayne Kent notes:  Cerebral angiogram 5/1: COMPLICATIONS: None  PREOPERATIVE COURSE: Patient is a 57 years year old Female with a history of idiopathic intracranial hypertension with pulsatile tinnitus was seen as an outpatient in our clinic. [name] films were reviewed with the patient and the family and options were discussed with them including but not limited to noninvasive imaging,  observation, angiography amongst others. Benefits and risks of each option were discussed with them in layman's terms with the risks of the angiogram including but not limited to infection, hemorrhage, stroke, paralysis, blindness, speech impairment, renal failure, DVT, PE, cardio pulmonary complications and death amongst others. All the patient's questions were answered to their satisfaction as voiced by them and they requested for [place] to proceed.  DESCRIPTION OF PROCEDURE: Patient was brought to the angio suite and after patient was positioned and all pressure points padded, patient was prepped and draped in usual sterile fashion. After injection of local anesthetic, using ultrasound guidance single vessel access was gained using a micropuncture needle to the right radial artery. Thereafter using a modified Seldinger technique a 5 French sheath was inserted. The diagnostic catheter was navigated over the aortic arch and vessels were selectively catheterized over a 0.038 inch Glidewire. After the images were obtained they were reviewed and found to be of diagnostic quality. The diagnostic catheter and wire were then removed and the sheath discontinued with closure of the access site with TR Band At the end of the procedure patient's pulses were present.  I was present for the entire procedure.  Right common carotid artery cervical view, AP and lateral views demonstrates the common carotid artery which courses superiorly and bifurcates into the external the internal carotid arteries there is minimal disease burden at the origin of the right internal carotid artery as it courses superiorly into the skull base.  Right common carotid artery cranial view, AP and lateral view demonstrates the  internal carotid artery coursing into the skull base through the petrous, lacerum and cavernous segments and then in the post clinoid segment continuing superiorly and bifurcating into the ACA as well as the MCA. The middle  cerebral artery bifurcation then continues laterally into the convexity branches. The anterior cerebral artery continues medially into the anterior cerebral artery circulation.  Left common carotid artery cranial view, AP and lateral view demonstrates the internal carotid artery coursing into the skull base through the petrous, lacerum and cavernous segments and then in the post clinoid segment continuing superiorly and bifurcating into the ACA as well as the MCA. The middle cerebral artery bifurcation then continues laterally into the convexity branches. The anterior cerebral artery continues medially into the anterior cerebral artery circulation.  Left common carotid artery injection cranial AP and lateral views demonstrates internal carotid artery coursing through the skull base through the petrous, lacerum and cavernous segments and then in the posterior clinoid segment continuing superiorly and bifurcating into the ACA as well as the MCA. The middle cerebral artery bifurcation and continues laterally into the convexity branches. The anterior cerebral artery continues medially into the anterior cerebral artery circulation.  Left subclavian artery injection demonstrates the origin of the left vertebral artery without any significant stenosis. The artery then continues superiorly in a tortuous fashion.  Left vertebral artery injection cranial AP and lateral view demonstrates the left V2, V3 and V4 segments as it continues into the basilar artery which continues superiorly into the posterior cerebral circulation.  Right vertebral artery injection cranial AP and lateral view demonstrates the right V2, V3 and V4 segments as it continues into the basilar artery which continues superiorly into the posterior cerebral circulation.   Patient complains of symptoms per HPI as well as the following symptoms: none . Pertinent negatives and positives per HPI. All others negative    06/07/2022: She was referred  to [place] for IDIOPATHIC INTRACRANIAL HYPERTENSION due to consistent findings on MRI and MRV/CTV leading partially empty sella, expanded optic nerve sheaths, stenosis of cerebral sinus veins that can be seen in the setting of idiopathic intracranial hypertension, unfortunately when we saw her she was already placed on Diamox making a workup very difficult  opening pressure was normal at 10 (unfortunately she was already placed on diamox before we even saw her and the opening pressure could have been inacurate), and also ophthalmology exam was normal (she was already on Diamox) but also after being on diamox. Her headache is across the temples and if she moves her head she can feel a pain and fluid and her ears feel full and she has congestion may have a sinus infection. No migrainous feautures. Last time she took the diamox was weeks ago. She was feeling beter until covid and she has started feeling bad again with pressure since covid. Lots of pressure in the head.    May 07 2022: MRI cervical spine  EXAM: MRI CERVICAL SPINE WITHOUT CONTRAST  TECHNIQUE: Multiplanar, multisequence MR imaging of the cervical spine was performed. No intravenous contrast was administered.  COMPARISON: None Available.  FINDINGS: Alignment: Straightening. No substantial sagittal subluxation.  Vertebrae: No fracture, evidence of discitis, or bone lesion.  Cord: Normal cord signal.  Posterior Fossa, vertebral arteries, paraspinal tissues: Visualized vertebral artery flow voids maintained. Unremarkable visualized posterior fossa. Enlarged and partially empty sella.  Disc levels:  Question ossification of posterior longitudinal ligament, particularly lower cervical spine.  C2-C3: Small posterior disc osteophyte complex without significant stenosis.  C3-C4: Posterior  disc osteophyte complex with bilateral facet and uncovertebral hypertrophy. No significant canal or foraminal stenosis.  C4-C5: Posterior disc  osteophyte complex with facet and uncovertebral hypertrophy. Resulting mild-to-moderate canal on mild-to-moderate bilateral foraminal stenosis.  C5-C6: Posterior disc osteophyte complex with bilateral uncovertebral hypertrophy. Mild canal stenosis. Patent foramina.  C6-C7: Posterior disc osteophyte complex with bilateral uncovertebral hypertrophy. Mild bilateral foraminal stenosis without significant stenosis.  C7-T1: Left posterior disc osteophyte complex and uncovertebral hypertrophy. Patent canal and foramina.  IMPRESSION: 1. 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 Mild-to-moderate bilateral foraminal stenosis C4-C5. A CT of the cervical spine may be useful to assess for OPLL, if clinically warranted. 2. Enlarged and partially empty sella, which is often a normal anatomic variant but can be associated with idiopathic intracranial hypertension (pseudotumor cerebri).   Electronically Signed By: Feliberto Harts M.D. On: 05/09/2022 12:03   11/15/22 ALL:  JEREMIAH TARPLEY is a 57 y.o. female here today for follow up for IIH. She was seen in consult with Dr Lucia Gaskins 03/2022 for IIH following ER visit 04/09/2022 where MRI showed empty sella and prominence of CSF ventral to pons and along optic nerves. CTA/CTV showed concerns for IIH. 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 No thrombosis. Ophthalmology note 05/03/2022 did not show any concerns of optic edema. LP 04/25/2022 showed normal opening pressure. She was experiencing head squeezing on Diamox and after low potassium was switched to topiramate. She reports not being comfortable with side effects of topiramate and has continued acetazolamide 125mg  QD-BID. She continues to feel swimmy headed and reports inability to lie flat. She has intermittent left  eye twitching. She denies headaches. No vision changes or concerns of vision loss. She requested a referral to [facility] specialists and currently awaiting appt with [facility] neurology.   Reviewed Dr Teresa Kent notes from 10/10/2022: Place of service: patient home  Video Visit Assessment and Plan    57 year old lady with history of idiopathic intracranial hypertension with an opening pressure of 19 cm upon lumbar puncture in whom we did a venous and arterial angiogram. The arterial angiogram did not demonstrate any vascular abnormalities. The venous angiogram did not demonstrate any significant pressure gradient however there was a stenosis at the right transverse/sigmoid junction which I suspect is the cause of her right-sided pulsatile tinnitus which can be eliminated with compression of the right jugular vein.  I reviewed the imaging with her and her mother. I explained to her that an endovascular treatment with a stent would merely eliminate the pulsatile tinnitus and would not significantly affect her intracranial pressures. If she does not want to pursue medical management then I would recommend a ventriculoperitoneal shunt. More likely than not, reduction of intracranial pressures would also help with the pulsatile tinnitus.  She spoke to me about medical management I told her that she can definitely continue on Diamox and she told me that 1000 mg a day was too much and the lower dose helped her a lot and she is going to talk to her neurologist to resume Diamox. I shared with her that as long as she can continue to lose weight and continue medical management, I suspect that over time her headaches would get better and she can avoid surgery however, if maximal medical management does not result in alleviation of her symptoms, her vision does not improve and or the side effects are  intolerable, then a shunt would be my next recommendation.  We talked about this for a while and she is going to think about this and  talk to her neurologist and she will let me know if there is anything I can do for her to assist in her care.  Cerebral angiogram 09/2022: COMPLICATIONS: None  PREOPERATIVE COURSE: Patient is a 45 years year old Female with a history of idiopathic intracranial hypertension with pulsatile tinnitus was seen as an outpatient in our clinic. [name] films were reviewed with the patient and the family and options were discussed with them including but not limited to noninvasive imaging, observation, angiography amongst others. Benefits and risks of each option were discussed with them in layman's terms with the risks of the angiogram including but not limited to infection, hemorrhage, stroke, paralysis, blindness, speech impairment, renal failure, DVT, PE, cardio pulmonary complications and death amongst others. All the patient's questions were answered to their satisfaction as voiced by them and they requested for [place] to proceed.  DESCRIPTION OF PROCEDURE: Patient was brought to the angio suite and after patient was positioned and all pressure points padded, patient was prepped and draped in usual sterile fashion. After injection of local anesthetic, using ultrasound guidance single vessel access was gained using a micropuncture needle to the right radial artery. Thereafter using a modified Seldinger technique a 5 French sheath was inserted. The diagnostic catheter was navigated over the aortic arch and vessels were selectively catheterized over a 0.038 inch Glidewire. After the images were obtained they were reviewed and found to be of diagnostic quality. The diagnostic catheter and wire were then removed and the sheath discontinued with closure of the access site with TR Band At the end of the procedure patient's pulses were present.  I was present for the entire procedure.  Right common carotid artery cervical view, AP and lateral views demonstrates the common carotid artery which courses superiorly and  bifurcates into the external the internal carotid arteries there is minimal disease burden at the origin of the right internal carotid artery as it courses superiorly into the skull base.  Right common carotid artery cranial view, AP and lateral view demonstrates the internal carotid artery coursing into the skull base through the petrous, lacerum and cavernous segments and then in the post clinoid segment continuing superiorly and bifurcating into the ACA as well as the MCA. The middle cerebral artery bifurcation then continues laterally into the convexity branches. The anterior cerebral artery continues medially into the anterior cerebral artery circulation.  Left common carotid artery cranial view, AP and lateral view demonstrates the internal carotid artery coursing into the skull base through the petrous, lacerum and cavernous segments and then in the post clinoid segment continuing superiorly and bifurcating into the ACA as well as the MCA. The middle cerebral artery bifurcation then continues laterally into the convexity branches. The anterior cerebral artery continues medially into the anterior cerebral artery circulation.  Left common carotid artery injection cranial AP and lateral views demonstrates internal carotid artery coursing through the skull base through the petrous, lacerum and cavernous segments and then in the posterior clinoid segment continuing superiorly and bifurcating into the ACA as well as the MCA. The middle cerebral artery bifurcation and continues laterally into the convexity branches. The anterior cerebral artery continues medially into the anterior cerebral artery circulation.  Left subclavian artery injection demonstrates the origin of the left vertebral artery without any significant stenosis. The artery then continues superiorly in a tortuous  fashion.  Left vertebral artery injection cranial AP and lateral view demonstrates the left V2, V3 and V4 segments as it continues  into the basilar artery which continues superiorly into the posterior cerebral circulation.  Right vertebral artery injection cranial AP and lateral view demonstrates the right V2, V3 and V4 segments as it continues into the basilar artery which continues superiorly into the posterior cerebral circulation.    HISTORY (copied from Dr Trevor Mace previous note)  HPI:  Teresa Kent is a 57 y.o. female here as requested by Mardene Sayer, MD for IDIOPATHIC INTRACRANIAL HYPERTENSION.  Started 2 months agian, strange pressure in her neck, waxes and wanes, has headaches left side of the head, in the ear, full ear like an ear plug, blurry vision , pressure in the head, no inciding events, no numbness, ni weakness, headaches are tolerable 2-3/10, she has an upcoming vision exam who is an ophthalmologist, no vision loss episodic blurry vision, better if she leans to one side positional, more pressure. No other focal neurologic deficits, associated symptoms, inciting events or modifiable factors.the diamox has helped. Also going to rheumatology for her elevated ESR/CRP and is on steroids. No other focal neurologic deficits, associated symptoms, inciting events or modifiable factors.    REVIEW OF SYSTEMS: Out of a complete 14 system review of symptoms, the patient complains only of the following symptoms, head pressure, eye twitching, and all other reviewed systems are negative.   ALLERGIES: Allergies  Allergen Reactions   Decadron [Dexamethasone] Other (See Comments)    Shaky    Doxycycline     Per records from [facility], worsening acne side effects    Prilosec [Omeprazole]     Chest pains   Simvastatin     Weakness, mind fog, and back pain    Vitamin D Analogs     Constipation with OTC products, Per records from [facility] MEDICATIONS: Outpatient Medications Prior to Visit  Medication Sig Dispense Refill   acetaminophen (TYLENOL) 650 MG CR tablet Take 650 mg by  mouth as needed for pain.     calcium carbonate (TUMS EX) 750 MG chewable tablet Chew 1 tablet by mouth as needed for heartburn.     ezetimibe (ZETIA) 10 MG tablet Take 1 tablet (10 mg total) by mouth daily. 90 tablet 3   levothyroxine (SYNTHROID) 150 MCG tablet Take 150 mcg by mouth daily before breakfast.     loratadine (CLARITIN) 10 MG tablet Take 10 mg by mouth 3 (three) times a week.     triamterene-hydrochlorothiazide (MAXZIDE) 75-50 MG tablet Take 1 tablet by mouth daily.     acetaZOLAMIDE (DIAMOX) 125 MG tablet Take 1 tablet (125 mg total) by mouth 2 (two) times daily. 60 tablet 4   No facility-administered medications prior to visit.     PAST MEDICAL HISTORY: Past Medical History:  Diagnosis Date   Abnormal Pap smear 02/21/2003   Acne    Per records from [facility]   Acute sinusitis    Per Records from [facility]   Anemia    heavy menses   Arthritis    Per [facility] New Patient Packet   Cervicogenic headache    Per [facility] New Patient Packet   Colon cancer screening 03/10/2022   Per Records from [facility]   COVID    Per Records from [facility]   Elevated sed rate    Per records from [facility]   Elevated sed rate  Per Records from [facility]   Endometriosis 02/21/2004   Fever blister    Per records from [facility]   Fibroids, submucosal 02/21/2004   GERD (gastroesophageal reflux disease)    Per Records from [facility]   Goiter    Per records from [facility]   H/O: menorrhagia 02/21/2004   High cholesterol    Per [facility] New Patient Packet   Hx of mammogram 2024   Per [facility] New Patient Packet   Hypertension    Hypothyroidism    Idiopathic intracranial hypertension    Per Records from [facility]   Increased BMI 10/11/2010   Lower extremity edema    Per Records from [facility]   Morbid obesity [facility])    Per records from [facility]   Ovarian cyst, right 03/19/2004   Papanicolaou smear 09/2021    Per Records from [facility]   Postprocedural hypothyroidism    Per Records from [facility]   Simple endometrial hyperplasia 02/21/2004   Vitamin D deficiency    Per records from [facility]     PAST SURGICAL HISTORY: Past Surgical History:  Procedure Laterality Date   DILATION AND CURETTAGE OF UTERUS  03/19/2004   DILITATION & CURRETTAGE/HYSTROSCOPY WITH NOVASURE ABLATION N/A 12/20/2012   Procedure: DILATATION & CURETTAGE/HYSTEROSCOPY WITH NOVASURE ABLATION;  Surgeon: Purcell Nails, MD;  Location: [facility];  Service: Gynecology;  Laterality: N/A;   OVARY SURGERY     resection of fibroid  03/19/2004   THYROID SURGERY     TUBAL LIGATION       FAMILY HISTORY: Family History  Problem Relation Age of Onset   Hypotension Mother    High blood pressure Mother        Per [facility] New Patient Packet   High Cholesterol Mother        Per [facility] New Patient Packet   Arthritis Mother        Per [facility] New Patient Packet   High blood pressure Brother        Per [facility] New Patient Packet   High Cholesterol Brother        Per [facility] New Patient Packet   Cancer Maternal Aunt        Per [facility] New Patient Packet   Diabetes Maternal Aunt    Diabetes Maternal Uncle        Per [facility] New Patient Packet   Breast cancer Maternal Grandmother    Diabetes Maternal Grandmother    Stroke Maternal Grandmother        Per [facility] New Patient Packet   Arthritis Maternal Grandmother        Per [facility] New Patient Packet   High blood pressure Son        Per [facility] New Patient Packet   High Cholesterol Son        Per [facility] New Patient Packet   Thyroid disease Son        Per [facility] New Patient Packet   Mood Disorder Son        Per [facility] New Patient Packet   Autism Son        Per [facility] New Patient Packet   Migraines Neg Hx      SOCIAL HISTORY: Social History   Socioeconomic History   Marital status: Single    Spouse name: Not on file   Number of children: Not on file   Years of education: Not on file   Highest  education level: Not on file  Occupational History   Not on file  Tobacco Use   Smoking status: Never   Smokeless tobacco: Never  Vaping Use   Vaping Use: Never used  Substance and Sexual Activity   Alcohol use: No   Drug use: No   Sexual activity: Not on file  Other Topics Concern   Not on file  Social History Narrative   Right Handed   No Caffeine use       Per [facility] New Patient Packet:      Diet:Left Blank       Caffeine: No      Married, if yes what year: Single       Do you live in a house, apartment, assisted living, condo, trailer, ect: Apartment       Is it one or more stories:One       How many persons live in your home? 2      Pets:No      Highest level or education completed: College      Current/Past profession: PreK Teacher       Exercise:       R[place]           Type and how often: weekly walking          Living Will: No   DNR: No   POA/HPOA: No      Functional Status:   Do you have difficulty bathing or dressing yourself? Left Blank    Do you have difficulty preparing food or eating?Left Blank    Do you have difficulty managing your medications?Left Blank    Do you have difficulty managing your finances?Left Blank    Do you have difficulty affording your medications?Left Blank    Social Determinants of Health   Financial Resource Strain: Not on file  Food Insecurity: Not on file  Transportation Needs: Not on file  Physical Activity: Not on file  Stress: Not on file  Social Connections: Not on file  Intimate Partner Violence: Not on file     PHYSICAL EXAM  Vitals:   11/14/22 0937  BP: 116/75  Pulse: (!) 59  Weight: 204 lb (92.5 kg)  Height: 5\' 11"  (1.803 m)   Body mass index is 28.45 kg/m. Physical exam: Exam: Gen: NAD, conversant, well nourised, obese, well groomed                     CV: RRR, no MRG. No Carotid Bruits. No peripheral edema, warm, nontender Eyes: Conjunctivae clear without exudates or  hemorrhage  Neuro: Detailed Neurologic Exam  Speech:    Speech is normal; fluent and spontaneous with normal comprehension.  Cognition:    The patient is oriented to person, place, and time;     recent and remote memory intact;     language fluent;     normal attention, concentration,     fund of knowledge Cranial Nerves:    The pupils are equal, round, and reactive to light. Normal ONH Visual fields are full to finger confrontation. Extraocular movements are intact. Trigeminal sensation is intact and the muscles of mastication are normal. The face is symmetric. The palate elevates in the midline. Hearing intact. Voice is normal. Shoulder shrug is normal. The tongue has normal motion without fasciculations.   Coordination: nml  Gait: nml  Motor Observation:    No asymmetry, no atrophy, and no involuntary movements noted. Tone:    Normal muscle tone.    Posture:    Posture is  normal. normal erect    Strength:    Strength is V/V in the upper and lower limbs.      Sensation: intact to LT     Reflex Exam:  DTR's:    Deep tendon reflexes in the upper and lower extremities are normal bilaterally.   Toes:    The toes are downgoing bilaterally.   Clonus:    Clonus is absent.   DIAGNOSTIC DATA (LABS, IMAGING, TESTING) - I reviewed patient records, labs, notes, testing and imaging myself where available.  Lab Results  Component Value Date   WBC 4.5 11/02/2022   HGB 13.1 11/02/2022   HCT 41.3 11/02/2022   MCV 89 11/02/2022   PLT 309 11/02/2022      Component Value Date/Time   NA 144 11/02/2022 0834   K 3.8 11/02/2022 0834   CL 106 11/02/2022 0834   CO2 25 11/02/2022 0834   GLUCOSE 83 11/02/2022 0834   GLUCOSE 90 10/06/2022 1354   BUN 9 11/02/2022 0834   CREATININE 0.85 11/02/2022 0834   CREATININE 0.67 08/12/2022 0912   CALCIUM 9.2 11/02/2022 0834   PROT 6.8 11/02/2022 0834   ALBUMIN 3.9 11/02/2022 0834   AST 13 11/02/2022 0834   ALT 13 11/02/2022 0834    ALKPHOS 65 11/02/2022 0834   BILITOT 1.0 11/02/2022 0834   GFRNONAA >60 10/06/2022 1354   GFRAA >90 12/18/2012 1100   Lab Results  Component Value Date   CHOL 243 (H) 08/12/2022   HDL 71 08/12/2022   LDLCALC 152 (H) 08/12/2022   TRIG 90 08/12/2022   CHOLHDL 3.4 08/12/2022   No results found for: "HGBA1C" Lab Results  Component Value Date   VITAMINB12 834 04/12/2022   Lab Results  Component Value Date   TSH 0.89 08/12/2022        No data to display               No data to display           ASSESSMENT AND PLAN  57 y.o. year old female  has a past medical history of Abnormal Pap smear (02/21/2003), Acne, Acute sinusitis, Anemia, Arthritis, Cervicogenic headache, Colon cancer screening (03/10/2022), COVID, Elevated sed rate, Elevated sed rate, Endometriosis (02/21/2004), Fever blister, Fibroids, submucosal (02/21/2004), GERD (gastroesophageal reflux disease), Goiter, H/O: menorrhagia (02/21/2004), High cholesterol, mammogram (2024), Hypertension, Hypothyroidism, Idiopathic intracranial hypertension, Increased BMI (10/11/2010), Lower extremity edema, Morbid obesity (HCC), Ovarian cyst, right (03/19/2004), Papanicolaou smear (09/2021), Postprocedural hypothyroidism, Simple endometrial hyperplasia (02/21/2004), and Vitamin D deficiency.: She was referred to [place] for IDIOPATHIC INTRACRANIAL HYPERTENSION due to consistent findings on MRI and MRV/CTV leading partially empty sella, expanded optic nerve sheaths, stenosis of cerebral sinus veins that can be seen in the setting of idiopathic intracranial hypertension, unfortunately when we saw her she was already placed on Diamox making a workup very difficult  opening pressure was normal at 10 (unfortunately she was already placed on diamox before we even saw her and the opening pressure could have been inacurate), and also ophthalmology exam was normal (she was already on Diamox) but also after being on diamox. Her headache is across the  temples and if she moves her head she can feel a pain and fluid and her ears feel full and she has congestion may have a sinus infection. No migrainous feautures. Last time she took the diamox was weeks ago. She was feeling beter until covid and she has started feeling bad again with pressure since covid. Lots of pressure  in the head.  Has LP off of the diamox and was OP19 and without and change in symptoms. No no headaches.    - She saw Dr. Kelby Kent, had cerbral angiography, offered to stent which would decrease tinnitus but would not affect her possible IDIOPATHIC INTRACRANIAL HYPERTENSION which is now likely resolved, had significant weight loss, can take diamox as needed, f/u as needed - She doesn't have any headaches any more.  - She takes one 125mg  diamox every other day because she had hives. She declines topamax or other options.  - She saw dr Teresa Kent and no papilledema per practitioner (we will request notes).  -,still has tinnitus on the right and left still there.  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  Dr Teresa Kent offered to stent her which would help tinnitis but not the IDIOPATHIC INTRACRANIAL HYPERTENSION, had cerebral venogram, see his notes under last visit. - No changes in vision, she wears bifocals, she has also lost weight which may be helping. She will continue what she is doing, stay active, keep losing weight and clinically monitor with neurology and ophthalmology. -  I do not recommend taking diamox because of the hives, but she has been [employer] as needed without a reaction (only had hives when took it straight for a week). Watrned again anaphylaxis, she understands. For any  reaction especially breathing call 911 may not have even been the diamox bc no reaction when takes it as needed.  - See dr Teresa Kent as needed.  - Follow up with Dr. 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 She at one point weighed 350 pounds and gradually started losing weight. She was felt to have elevated intracranial pressures and was started on Diamox. While she was taking Diamox she became severely intolerant and over a period of 2 months lost 70 pounds because of nausea and vomiting and inability to keep any food down. A lumbar puncture was done at that point and she was found to have an opening pressure of 10 cm. Her neurologist, Dr. Lucia Gaskins, did not feel that obtaining pressures while the patient is on Diamox was most accurate. [name] Diamox was stopped and the opening pressure was 19 cm. After this patient did not notice any significant improvement.  She was evaluated by her rheumatologist who felt that she had giant cell arteritis. She subsequently, patient saw Dr. Sheppard Penton, neurosurgeon at [facility] who did not think that she had this and did not recommend a biopsy. Patient never saw Dr. Sheppard Penton herself and only talked her on the phone.  Patient was referred to [place] by her neurologist for evaluation of idiopathic intracranial hypertension. 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 There is a stenosis at the right transverse sigmoid junction.  Most notably, patient can make her pulsatile tinnitus on the right stop by compressing her jugular vein. The headaches are daily and they are on top of her head but also radiate down to her neck. There was some question whether these were cervicogenic and with some physical therapy and the headaches have gotten better but not significantly.  PLAN I reviewed the imaging with her and this indeed shows the right-sided stenosis.  With an opening pressure of 19 cm I am not entirely convinced that all her complaints are  from the elevated pressures especially given the fact that after the lumbar puncture she did not notice any  improvement of her headaches. They very well may be multifactorial because here with cervicogenic component included into the mix. However, the pulsatile tinnitus on the right with the ability to stop it by compressing the jugular vein is very suspicious. The MRV also suggests a stenosis which can be seen with elevated intracranial pressures.  I shared with her that that the alternative is to start Topamax but she is adamant that she does not want to take any other medications. She has read about the side effects and with her experience with Diamox, she does not want to try Topamax.  We talked at length about the diagnosis of idiopathic intracranial hypertension and I told her about the endovascular versus open surgical options. After discussing the risks and benefits, she opted for the endovascular option and I will do a diagnostic arterial and venous angiogram, the former to rule out a dural AV fistula. The scan also give similar complaints. Once this is done, then we will make a decision depending on if we find a pressure gradient on. She voiced understanding and requested for [place] to proceed.   Headaches LP while off of the diamox - she had a bad experience at [facility] with last LP I reassured her we can ask for a different radiologist In the meantime will treat you with migraine medication to see if that helps - qulipta samples STOP DIAMOX - Please wait 2 weeks to one month to schedule LP while trying to get her off of diamox to get an acurate opening pressure.  Cervical Stenosis: May 07 2022: MRI cervical spine: See on [place], may need surgery she has several levels that appear to have moderate to severe stenosis(I don;t agree with report, appears worse by my review) will send to Dr. Jake Samples for evaluation:   IMPRESSION: 1. 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 Mild-to-moderate bilateral foraminal stenosis C4-C5. A CT of the cervical spine may be useful to assess for OPLL, if clinically warranted. 2. Enlarged and partially empty sella, which is often a normal anatomic variant but can be associated with idiopathic intracranial hypertension (pseudotumor cerebri).   Electronically Signed By: Feliberto Harts M.D. On: 05/09/2022 12:03   I spent over 40 minutes of face-to-face and non-face-to-face time with patient on the  1. IIH (idiopathic intracranial hypertension)   2. Tinnitus of both ears   3. DDD (degenerative disc disease), cervical     diagnosis.  This included previsit chart review, lab review, study review, order entry, electronic health record documentation, patient education on the different diagnostic and therapeutic options, counseling and coordination of care, risks and benefits of management, compliance, or risk factor reduction  Naomie Dean, MD [facility] [address], [address] [place], [place] [address] [phone]

## 2022-11-15 ENCOUNTER — Encounter: Payer: Self-pay | Admitting: Orthopedic Surgery

## 2022-11-15 ENCOUNTER — Ambulatory Visit (INDEPENDENT_AMBULATORY_CARE_PROVIDER_SITE_OTHER): Payer: Medicaid Other | Admitting: Orthopedic Surgery

## 2022-11-15 VITALS — BP 122/80 | HR 66 | Temp 97.8°F | Resp 16 | Ht 71.0 in | Wt 202.8 lb

## 2022-11-15 DIAGNOSIS — R21 Rash and other nonspecific skin eruption: Secondary | ICD-10-CM | POA: Diagnosis not present

## 2022-11-15 DIAGNOSIS — H9313 Tinnitus, bilateral: Secondary | ICD-10-CM | POA: Insufficient documentation

## 2022-11-15 DIAGNOSIS — M5030 Other cervical disc degeneration, unspecified cervical region: Secondary | ICD-10-CM | POA: Insufficient documentation

## 2022-11-15 MED ORDER — EPINEPHRINE 0.3 MG/0.3ML IJ SOAJ
0.3000 mg | INTRAMUSCULAR | 0 refills | Status: AC | PRN
Start: 2022-11-15 — End: ?

## 2022-11-15 MED ORDER — PREDNISONE 20 MG PO TABS
ORAL_TABLET | ORAL | 0 refills | Status: AC
Start: 2022-11-15 — End: 2022-11-22

## 2022-11-15 NOTE — Progress Notes (Signed)
Careteam: Patient Care Team: Sharon Seller, NP as PCP - General (Geriatric Medicine) Thomasene Ripple, DO as PCP - Cardiology (Cardiology) Kathi Der, MD as Consulting Physician (Gastroenterology) Osborn Coho, MD as Consulting Physician (Obstetrics and Gynecology) Thomasene Ripple, DO as Consulting Physician (Cardiology) Anson Fret, MD as Consulting Physician (Neurology) Lupita Leash, MD as Consulting Physician (Pulmonary Disease)  Seen by: Hazle Nordmann, AGNP-C  PLACE OF SERVICE:  [facility]  Advanced Directive information Does Patient Have a Medical Advance Directive?: Yes, Type of Advance Directive: Living will, Does patient want to make changes to medical advance directive?: No - Patient declined  Allergies  Allergen Reactions   Decadron [Dexamethasone] Other (See Comments)    Shaky    Doxycycline     Per records from [facility], worsening acne side effects    Prilosec [Omeprazole]     Chest pains   Simvastatin     Weakness, mind fog, and back pain    Vitamin D Analogs     Constipation with OTC products, Per records from [facility]    Chief Complaint  Patient presents with   Acute Visit    Patient complains of rash on face.      HPI: Patient is a 57 y.o. female seen today for acute visit due to facial rash.   06/20 she noticed rash to right upper thigh. 06/22 rash to right upper thigh subsided, but face was swollen around eyes. Face was also itching.  She was taken off Diamox by another provider 06/24. Facial itching has not subsided. She is taking Claritin without relief. Eucerin lotion helped for a brief time. She has not changes detergents, soaps or facial creams. Denies recent contact with poison ivy. Denies insect bites. Requesting dermatology referral today.   Review of Systems:  Review of Systems  Constitutional:  Negative for fever and malaise/fatigue.  Respiratory:  Negative for cough, shortness of breath and wheezing.    Cardiovascular:  Negative for chest pain and leg swelling.  Skin:  Positive for itching and rash.  Neurological:  Negative for dizziness and tingling.  Endo/Heme/Allergies:  Positive for environmental allergies.  Psychiatric/Behavioral:  Negative for depression. The patient is not nervous/anxious.     Past Medical History:  Diagnosis Date   Abnormal Pap smear 02/21/2003   Acne    Per records from [facility]   Acute sinusitis    Per Records from [facility]   Anemia    heavy menses   Arthritis    Per [facility] New Patient Packet   Cervicogenic headache    Per [facility] New Patient Packet   Colon cancer screening 03/10/2022   Per Records from [facility]   COVID    Per Records from [facility]   Elevated sed rate    Per records from [facility]   Elevated sed rate    Per Records from [facility]   Endometriosis 02/21/2004   Fever blister    Per records from [facility]   Fibroids, submucosal 02/21/2004   GERD (gastroesophageal reflux disease)    Per Records from [facility]   Goiter    Per records from [facility]   H/O: menorrhagia 02/21/2004   High cholesterol    Per [facility] New Patient Packet   Hx of mammogram 2024   Per [facility] New Patient Packet   Hypertension    Hypothyroidism    Idiopathic intracranial hypertension    Per Records from [facility]   Increased BMI  10/11/2010   Lower extremity edema    Per Records from [facility]   Morbid obesity [facility])    Per records from [facility]   Ovarian cyst, right 03/19/2004   Papanicolaou smear 09/2021   Per [facility]   Postprocedural hypothyroidism    Per Records from [facility]   Simple endometrial hyperplasia 02/21/2004   Vitamin D deficiency    Per records from [facility]   Past Surgical History:  Procedure Laterality Date   DILATION AND CURETTAGE OF UTERUS  03/19/2004   DILITATION & CURRETTAGE/HYSTROSCOPY WITH NOVASURE ABLATION N/A  12/20/2012   Procedure: DILATATION & CURETTAGE/HYSTEROSCOPY WITH NOVASURE ABLATION;  Surgeon: Purcell Nails, MD;  Location: [facility];  Service: Gynecology;  Laterality: N/A;   OVARY SURGERY     resection of fibroid  03/19/2004   THYROID SURGERY     TUBAL LIGATION     Social History:   reports that she has never smoked. She has never used smokeless tobacco. She reports that she does not drink alcohol and does not use drugs.  Family History  Problem Relation Age of Onset   Hypotension Mother    High blood pressure Mother        Per [facility] New Patient Packet   High Cholesterol Mother        Per [facility] New Patient Packet   Arthritis Mother        Per [facility] New Patient Packet   High blood pressure Brother        Per [facility] New Patient Packet   High Cholesterol Brother        Per [facility] New Patient Packet   Cancer Maternal Aunt        Per [facility] New Patient Packet   Diabetes Maternal Aunt    Diabetes Maternal Uncle        Per [facility] New Patient Packet   Breast cancer Maternal Grandmother    Diabetes Maternal Grandmother    Stroke Maternal Grandmother        Per [facility] New Patient Packet   Arthritis Maternal Grandmother        Per [facility] New Patient Packet   High blood pressure Son        Per [facility] New Patient Packet   High Cholesterol Son        Per [facility] New Patient Packet   Thyroid disease Son        Per [facility] New Patient Packet   Mood Disorder Son        Per [facility] New Patient Packet   Autism Son        Per [facility] New Patient Packet   Migraines Neg Hx     Medications: [name] Medications  New Prescriptions   No medications on file  Previous Medications   ACETAMINOPHEN (TYLENOL) 650 MG CR TABLET    Take 650 mg by mouth as needed for pain.   CALCIUM CARBONATE (TUMS EX) 750 MG CHEWABLE TABLET    Chew 1 tablet by mouth as needed for heartburn.   EZETIMIBE (ZETIA) 10 MG TABLET    Take 1 tablet (10 mg total) by mouth daily.   LEVOTHYROXINE (SYNTHROID) 150 MCG TABLET    Take 150 mcg by mouth daily before  breakfast.   LORATADINE (CLARITIN) 10 MG TABLET    Take 10 mg by mouth 3 (three) times a week.   TRIAMTERENE-HYDROCHLOROTHIAZIDE (MAXZIDE) 75-50 MG TABLET    Take 1 tablet by mouth daily.  Modified  Medications   No medications on file  Discontinued Medications   ACETAZOLAMIDE (DIAMOX) 125 MG TABLET    Take 1 tablet (125 mg total) by mouth 2 (two) times daily.    Physical Exam:  Vitals:   11/15/22 1258  BP: 122/80  Pulse: 66  Resp: 16  Temp: 97.8 F (36.6 C)  SpO2: 98%  Weight: 202 lb 12.8 oz (92 kg)  Height: 5\' 11"  (1.803 m)   Body mass index is 28.28 kg/m. Wt Readings from Last 3 Encounters:  11/15/22 202 lb 12.8 oz (92 kg)  11/14/22 204 lb (92.5 kg)  10/10/22 202 lb (91.6 kg)    Physical Exam Vitals reviewed.  Constitutional:      General: She is not in acute distress. HENT:     Head: Normocephalic.     Nose: Nose normal.     Mouth/Throat:     Mouth: Mucous membranes are moist.  Eyes:     General:        Right eye: No discharge.        Left eye: No discharge.     Comments: No periorbital edema  Cardiovascular:     Rate and Rhythm: Normal rate and regular rhythm.     Pulses: Normal pulses.     Heart sounds: Normal heart sounds.  Pulmonary:     Effort: Pulmonary effort is normal.     Breath sounds: Normal breath sounds.  Abdominal:     General: Bowel sounds are normal. There is no distension.     Palpations: Abdomen is soft.     Tenderness: There is no abdominal tenderness.  Musculoskeletal:     Cervical back: Neck supple.     Right lower leg: No edema.     Left lower leg: No edema.  Skin:    General: Skin is warm.     Capillary Refill: Capillary refill takes less than 2 seconds.     Findings: No rash.     Comments: No rash, swelling, erythema, scratch marks to face  Neurological:     General: No focal deficit present.     Mental Status: She is alert.  Psychiatric:        Mood and Affect: Mood normal.     Labs reviewed: Basic Metabolic  Panel: Recent Labs    04/12/22 2202 05/01/22 1112 06/10/22 0000 07/27/22 0000 08/12/22 0912 10/06/22 1354 11/02/22 0834  NA  --    < > 142   < > 140 139 144  K  --    < > 3.9   < > 3.6 3.5 3.8  CL  --    < > 104   < > 99 91* 106  CO2  --    < > 32*   < > 32 33* 25  GLUCOSE  --    < >  --   --  88 90 83  BUN  --    < > 6   < > 8 7 9   CREATININE  --    < > 0.7   < > 0.67 0.76 0.85  CALCIUM  --    < > 9.5   < > 9.6 10.9* 9.2  TSH 0.928  --  2.63  --  0.89  --   --    < > = values in this interval not displayed.   Liver Function Tests: Recent Labs    06/10/22 0000 07/27/22 0000 08/12/22 0912 10/06/22 1354 11/02/22 0834  AST 17  --  17 18 13   ALT 18  --  12 10 13   ALKPHOS 56  --   --  67 65  BILITOT  --   --  0.7 1.0 1.0  PROT  --   --  7.3 8.7* 6.8  ALBUMIN 3.8 3.9  3.9  --  4.8 3.9   No results for input(s): "LIPASE", "AMYLASE" in the last 8760 hours. No results for input(s): "AMMONIA" in the last 8760 hours. CBC: Recent Labs    08/12/22 0912 10/06/22 1354 11/02/22 0834  WBC 5.0 4.4 4.5  NEUTROABS 2,390 1.7 1.9  HGB 12.6 14.2 13.1  HCT 39.4 44.3 41.3  MCV 90.4 89.7 89  PLT 384 303 309   Lipid Panel: Recent Labs    06/10/22 0000 08/12/22 0912  CHOL 221* 243*  HDL 46 71  LDLCALC 157 152*  TRIG 103 90  CHOLHDL  --  3.4   TSH: Recent Labs    04/12/22 2202 06/10/22 0000 08/12/22 0912  TSH 0.928 2.63 0.89   A1C: No results found for: "HGBA1C"   Assessment/Plan 1. Facial rash - onset 06/20> started with right upper thigh> moved to face - taken off Diamox 06/24 by another provider - exam unremarkable> no erythema, swelling, scratches - reports constant itching with Claritin - will start prednisone taper - Ambulatory referral to Dermatology - predniSONE (DELTASONE) 20 MG tablet; Take 2 tablets (40 mg total) by mouth daily with breakfast for 2 days, THEN 1 tablet (20 mg total) daily with breakfast for 5 days.  Dispense: 9 tablet; Refill: 0 -  EPINEPHrine 0.3 mg/0.3 mL IJ SOAJ injection; Inject 0.3 mg into the muscle as needed for anaphylaxis.  Dispense: 1 each; Refill: 0  Total time: 22 minutes. Greater than 50% of total time spent doing patient education regarding health maintenance and rash including symptom/medication management.    Next appt: Visit date not found  Kashawna Manzer Scherry Ran  [facility] [phone]

## 2022-12-12 ENCOUNTER — Ambulatory Visit: Payer: Medicaid Other | Admitting: Dermatology

## 2022-12-12 ENCOUNTER — Ambulatory Visit (INDEPENDENT_AMBULATORY_CARE_PROVIDER_SITE_OTHER): Payer: Medicaid Other | Admitting: Adult Health

## 2022-12-12 ENCOUNTER — Encounter: Payer: Self-pay | Admitting: Adult Health

## 2022-12-12 VITALS — BP 108/70 | HR 62 | Temp 98.5°F | Ht 71.0 in | Wt 202.0 lb

## 2022-12-12 DIAGNOSIS — R0600 Dyspnea, unspecified: Secondary | ICD-10-CM | POA: Insufficient documentation

## 2022-12-12 DIAGNOSIS — U071 COVID-19: Secondary | ICD-10-CM

## 2022-12-12 MED ORDER — ALBUTEROL SULFATE HFA 108 (90 BASE) MCG/ACT IN AERS: 1.0000 | INHALATION_SPRAY | Freq: Four times a day (QID) | RESPIRATORY_TRACT | 2 refills | Status: AC | PRN

## 2022-12-12 NOTE — Progress Notes (Signed)
@Patient  ID: Teresa Kent, female    DOB: November 08, 1965, 57 y.o.   MRN: [id number]  Chief Complaint  Patient presents with   Follow-up    Referring provider: Sharon Seller, NP  HPI: 57 year old female never smoker seen for pulmonary consult July 13, 2022 for shortness of breath. History of temporal arteritis previously on prednisone (steroid stopped January 2024)   TEST/EVENTS :  06/27/2022 Eosinophils abs. Was 800. ESR 44 , CRP was 12.5 .  10/2022 eosinophils abs 200.  Echo 07/18/22 EF 60-65%, RVSF nml , RV size nml.   12/12/2022 Follow up: Dyspnea  Patient presents for a 20-month follow-up.  Patient was seen earlier this year for a pulmonary consult for shortness of breath.  She has a history of COVID-19 infection in January 2024 without hospitalization.  Treated with Paxlovid. Since than has had episodes of shortness of breath with activities and chest tightness.  Patient was given Breztri sample last visit.  Order PFTs x 2.  First time was unable to complete.  Second time missed her appointment.  Says she does not want to do because it causes a headache. He was recommended for CT chest but insurance would not cover since she has not had pulmonary function testing.  The Breztri inhaler.  She denies any cough or wheezing.  Chest x-ray May 16, 2022 showed clear lungs.  Recent lab work May 04, 2023 showed normal hemoglobin hematocrit.  Coronary CT chest May 19, 2022 showed a coronary calcium score of 0.  Visualized portions of the lung were clear.  Says symptoms are better . Seemed to improved with reflux medications.  Chest tightness has improved. Occasional shortness of breath.  Wants to get CT chest and pay out of pocket..  Previous diagnosed with Temporal arteritis , treated with prednisone (no biopsy) .  Recently seen by neuro , not TA. Dx with idiopathic intracranial hypertension .Marland Kitchen  Was started on Diamox but unable to tolerate.  No chest pain, calf pain,  hemoptysis. No hx of VTE or family hx of VTE.       Allergies  Allergen Reactions   Decadron [Dexamethasone] Other (See Comments)    Shaky    Doxycycline     Per records from [facility], worsening acne side effects    Prilosec [Omeprazole]     Chest pains   Simvastatin     Weakness, mind fog, and back pain    Vitamin D Analogs     Constipation with OTC products, Per records from [facility]    Immunization History  Administered Date(s) Administered   Influenza,inj,Quad PF,6+ Mos 02/22/2022   Influenza-Unspecified 03/07/2019   Tdap 07/02/2010   Unspecified SARS-COV-2 Vaccination 08/12/2019, 09/02/2019, 05/30/2020   Zoster Recombinant(Shingrix) 04/07/2020, 07/16/2020    Past Medical History:  Diagnosis Date   Abnormal Pap smear 02/21/2003   Acne    Per records from [facility]   Acute sinusitis    Per Records from [facility]   Anemia    heavy menses   Arthritis    Per [facility] New Patient Packet   Cervicogenic headache    Per [facility] New Patient Packet   Colon cancer screening 03/10/2022   Per Records from [facility]   COVID    Per Records from [facility]   Elevated sed rate    Per records from [facility]   Elevated sed rate    Per Records from [facility]   Endometriosis 02/21/2004   Fever blister  Per records from [facility]   Fibroids, submucosal 02/21/2004   GERD (gastroesophageal reflux disease)    Per Records from [facility]   Goiter    Per records from [facility]   H/O: menorrhagia 02/21/2004   High cholesterol    Per [facility] New Patient Packet   Hx of mammogram 2024   Per [facility] New Patient Packet   Hypertension    Hypothyroidism    Idiopathic intracranial hypertension    Per Records from [facility]   Increased BMI 10/11/2010   Lower extremity edema    Per Records from [facility]   Morbid obesity [facility])    Per records from [facility]   Ovarian cyst, right 03/19/2004    Papanicolaou smear 09/2021   Per Records from [facility]   Postprocedural hypothyroidism    Per Records from [facility]   Simple endometrial hyperplasia 02/21/2004   Vitamin D deficiency    Per records from [facility]    Tobacco History: Social History   Tobacco Use  Smoking Status Never  Smokeless Tobacco Never   Counseling given: Not Answered   Outpatient Medications Prior to Visit  Medication Sig Dispense Refill   acetaminophen (TYLENOL) 650 MG CR tablet Take 650 mg by mouth as needed for pain.     calcium carbonate (TUMS EX) 750 MG chewable tablet Chew 1 tablet by mouth as needed for heartburn.     EPINEPHrine 0.3 mg/0.3 mL IJ SOAJ injection Inject 0.3 mg into the muscle as needed for anaphylaxis. 1 each 0   ezetimibe (ZETIA) 10 MG tablet Take 1 tablet (10 mg total) by mouth daily. 90 tablet 3   levothyroxine (SYNTHROID) 150 MCG tablet Take 150 mcg by mouth daily before breakfast.     loratadine (CLARITIN) 10 MG tablet Take 10 mg by mouth 3 (three) times a week.     triamterene-hydrochlorothiazide (MAXZIDE) 75-50 MG tablet Take 1 tablet by mouth daily.     No facility-administered medications prior to visit.     Review of Systems:   Constitutional:   No  weight loss, night sweats,  Fevers, chills, fatigue, or  lassitude.  HEENT:   No   Difficulty swallowing,  Tooth/dental problems, or  Sore throat,                No sneezing, itching, ear ache, nasal congestion, post nasal drip,   CV:  No chest pain,  Orthopnea, PND, swelling in lower extremities, anasarca, dizziness, palpitations, syncope.   GI  No heartburn, indigestion, abdominal pain, nausea, vomiting, diarrhea, change in bowel habits, loss of appetite, bloody stools.   Resp:   No chest wall deformity  Skin: no rash or lesions.  GU: no dysuria, change in color of urine, no urgency or frequency.  No flank pain, no hematuria   MS:  No joint pain or swelling.  No decreased range of motion.  No  back pain.    Physical Exam  BP 108/70 (BP Location: Left Arm, Patient Position: Sitting, Cuff Size: Large)   Pulse 62   Temp 98.5 F (36.9 C) (Oral)   Ht 5\' 11"  (1.803 m)   Wt 202 lb (91.6 kg)   LMP 10/31/2019   SpO2 100%   BMI 28.17 kg/m   GEN: A/Ox3; pleasant , NAD, well nourished    HEENT:  [place]/AT,   NOSE-clear, THROAT-clear, no lesions, no postnasal drip or exudate noted.   NECK:  Supple w/ fair ROM; no JVD; normal carotid impulses  w/o bruits; no thyromegaly or nodules palpated; no lymphadenopathy.    RESP  Clear  P & A; w/o, wheezes/ rales/ or rhonchi. no accessory muscle use, no dullness to percussion  CARD:  RRR, no m/r/g, no peripheral edema, pulses intact, no cyanosis or clubbing.  GI:   Soft & nt; nml bowel sounds; no organomegaly or masses detected.   Musco: Warm bil, no deformities or joint swelling noted.   Neuro: alert, no focal deficits noted.    Skin: Warm, no lesions or rashes    Lab Results:  CBC    Component Value Date/Time   WBC 4.5 11/02/2022 0834   WBC 4.4 10/06/2022 1354   RBC 4.62 11/02/2022 0834   RBC 4.94 10/06/2022 1354   HGB 13.1 11/02/2022 0834   HCT 41.3 11/02/2022 0834   PLT 309 11/02/2022 0834   MCV 89 11/02/2022 0834   MCH 28.4 11/02/2022 0834   MCH 28.7 10/06/2022 1354   MCHC 31.7 11/02/2022 0834   MCHC 32.1 10/06/2022 1354   RDW 11.8 11/02/2022 0834   LYMPHSABS 1.9 11/02/2022 0834   MONOABS 0.4 10/06/2022 1354   EOSABS 0.2 11/02/2022 0834   BASOSABS 0.1 11/02/2022 0834    BMET    Component Value Date/Time   NA 144 11/02/2022 0834   K 3.8 11/02/2022 0834   CL 106 11/02/2022 0834   CO2 25 11/02/2022 0834   GLUCOSE 83 11/02/2022 0834   GLUCOSE 90 10/06/2022 1354   BUN 9 11/02/2022 0834   CREATININE 0.85 11/02/2022 0834   CREATININE 0.67 08/12/2022 0912   CALCIUM 9.2 11/02/2022 0834   GFRNONAA >60 10/06/2022 1354   GFRAA >90 12/18/2012 1100    BNP No results found for: "BNP"  ProBNP No results found for:  "PROBNP"  Imaging: No results found.  Administration History     None           No data to display          No results found for: "NITRICOXIDE"      Assessment & Plan:   Dyspnea Intermittent episodes of dyspnea since January 2024 after Covid 19 infections . ? Etiology .  Unfortunately has been unable to do pulmonary function testing.  We will proceed with a high-resolution CT chest.  To further evaluate for possible underlying interstitial process.  Symptoms do seem to be improved since last visit and with treatment aimed at reflux.  May have a component of some reactive airways.  Albuterol inhaler as needed.   Plan  Patient Instructions  Set up for HRCT chest.  Albuterol inhaler 1-2 puffs every 6 hr as needed for dyspnea.  Zyrtec 5mg  At bedtime  As needed  allergy symptoms .  Follow up with Dr. Judeth Horn or Quinlan Vollmer NP (30 min slot ) in 3 months .        Rubye Oaks, NP 12/12/2022

## 2022-12-12 NOTE — Patient Instructions (Addendum)
Set up for HRCT chest.  Albuterol inhaler 1-2 puffs every 6 hr as needed for dyspnea.  Zyrtec 5mg  At bedtime  As needed  allergy symptoms .  Follow up with Dr. Judeth Horn or Anastazia Creek NP (30 min slot ) in 3 months .

## 2022-12-12 NOTE — Assessment & Plan Note (Signed)
Intermittent episodes of dyspnea since January 2024 after Covid 19 infections . ? Etiology .  Unfortunately has been unable to do pulmonary function testing.  We will proceed with a high-resolution CT chest.  To further evaluate for possible underlying interstitial process.  Symptoms do seem to be improved since last visit and with treatment aimed at reflux.  May have a component of some reactive airways.  Albuterol inhaler as needed.   Plan  Patient Instructions  Set up for HRCT chest.  Albuterol inhaler 1-2 puffs every 6 hr as needed for dyspnea.  Zyrtec 5mg  At bedtime  As needed  allergy symptoms .  Follow up with Dr. Judeth Horn or Carlus Stay NP (30 min slot ) in 3 months .

## 2022-12-16 ENCOUNTER — Ambulatory Visit
Admission: EM | Admit: 2022-12-16 | Discharge: 2022-12-16 | Disposition: A | Discharge status: Home / Self Care | Payer: Medicaid Other | Source: Home / Self Care

## 2022-12-16 DIAGNOSIS — K2100 Gastro-esophageal reflux disease with esophagitis, without bleeding: Secondary | ICD-10-CM | POA: Diagnosis not present

## 2022-12-16 DIAGNOSIS — K5900 Constipation, unspecified: Secondary | ICD-10-CM

## 2022-12-16 MED ORDER — LANSOPRAZOLE 30 MG PO CPDR: 30.0000 mg | DELAYED_RELEASE_CAPSULE | Freq: Every morning | ORAL | 2 refills | Status: DC

## 2022-12-16 NOTE — Discharge Instructions (Addendum)
Your EKG today is normal and is therefore not the cause of the pain in your chest that you are experiencing at this time.  I have enclosed information about gastroesophageal reflux disease, food choices for gastroesophageal reflux disease and constipation that I hope you find helpful.    Please continue taking famotidine as you have been.  Diamox can be very constipating so it is important that you stay on top of having regular bowel movements.   Please read below to learn more about the medications, dosages and frequencies that I recommend to help alleviate your symptoms and to get you feeling better soon:   Prevacid (lansoprazole): This medication is called a proton pump inhibitor (PPI).  When taken regularly, it reduces the amount of acid that your stomach produces thereby reducing symptoms of heartburn and allowing irritation in the stomach to heal more quickly.  This medication does not cure reflux but prevents the contents of reflux from irritating your esophagus which is what causes heartburn.  This medication works best when taken exactly as prescribed, not "as needed".  After reviewing your medication list, there are no drug to drug interactions between Prevacid and any of the other medications you are taking.  MiraLAX (polyethylene glycol): This is a very effective stool softener that works by pulling water into your stool increasing the volume of your stool and making the consistency of your stool very soft.  MiraLAX is not absorbed into the body at all, it remains entirely in your gastrointestinal track, it will not affect your blood sugar or electrolyte levels and it will not interfere with any of your current medications.  For prevention of constipation, I recommend that you take 1 capful (17 g) of the MiraLAX powder mixed into 8 ounces of water every morning whether you are feeling constipated or not.  I recommend that you take MiraLAX daily for 3 months to "retrain" your bowels.  During the  first few days of taking MiraLAX, you may notice that you have more gas than usual.  This is normal and is the result of movement of gas that was previously trapped by retained stool.   If symptoms have not meaningfully improved in the next 5 to 7 days, please return for repeat evaluation or follow-up with your regular provider.  If symptoms have worsened in the next 3 to 5 days, please return for repeat evaluation or follow-up with your regular provider.    Thank you for visiting urgent care today.  We appreciate the opportunity to participate in your care.

## 2022-12-16 NOTE — ED Provider Notes (Signed)
[facility]    CSN: [id number] Arrival date & time: 12/16/22  1014    HISTORY   Chief Complaint  Patient presents with   Heartburn   HPI Teresa Kent is a pleasant, 57 y.o. female who presents to urgent care today. Patient complains of a burning in her chest that began 2 days ago after eating grapes and bananas.  Patient states the pain has been persistent.  Patient reports a known history of gastroesophageal reflux disease, typically takes famotidine 40 mg at nighttime to treat this.  Patient is currently being followed by a gastroenterologist.  Patient reports intermittent episodes of constipation but not any worse today than normal.  Patient denies nausea, vomiting, abnormal stools, abdominal pain.  Patient request an EKG to make sure her heart looks okay.  The history is provided by the patient.  Heartburn   Past Medical History:  Diagnosis Date   Abnormal Pap smear 02/21/2003   Acne    Per records from [facility]   Acute sinusitis    Per Records from [facility]   Anemia    heavy menses   Arthritis    Per [facility] New Patient Packet   Cervicogenic headache    Per [facility] New Patient Packet   Colon cancer screening 03/10/2022   Per Records from [facility]   COVID    Per Records from [facility]   Elevated sed rate    Per records from [facility]   Elevated sed rate    Per Records from [facility]   Endometriosis 02/21/2004   Fever blister    Per records from [facility]   Fibroids, submucosal 02/21/2004   GERD (gastroesophageal reflux disease)    Per Records from [facility]   Goiter    Per records from [facility]   H/O: menorrhagia 02/21/2004   High cholesterol    Per [facility] New Patient Packet   Hx of mammogram 2024   Per [facility] New Patient Packet   Hypertension    Hypothyroidism    Idiopathic intracranial hypertension    Per Records from [facility]   Increased BMI 10/11/2010   Lower extremity  edema    Per Records from [facility]   Morbid obesity [facility])    Per records from [facility]   Ovarian cyst, right 03/19/2004   Papanicolaou smear 09/2021   Per Records from [facility]   Postprocedural hypothyroidism    Per Records from [facility]   Simple endometrial hyperplasia 02/21/2004   Vitamin D deficiency    Per records from [facility]   Patient Active Problem List   Diagnosis Date Noted   Dyspnea 12/12/2022   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   DDD (degenerative disc disease), cervical 11/15/2022   Osteoarthritis of cervical spine 07/05/2022   Cervicogenic headache 07/05/2022   Chronic intractable headache 06/07/2022   Cervical stenosis of spinal canal 06/07/2022   IIH (idiopathic intracranial hypertension) 04/19/2022   Hidradenitis suppurativa 12/19/2011   Past Surgical History:  Procedure Laterality Date   DILATION AND CURETTAGE OF UTERUS  03/19/2004   DILITATION & CURRETTAGE/HYSTROSCOPY WITH NOVASURE ABLATION N/A 12/20/2012   Procedure: DILATATION & CURETTAGE/HYSTEROSCOPY WITH NOVASURE ABLATION;  Surgeon: Purcell Nails, MD;  Location: [facility];  Service: Gynecology;  Laterality: N/A;   OVARY SURGERY     resection of fibroid  03/19/2004   THYROID SURGERY     TUBAL LIGATION     OB History     Gravida  1   Para  1   Term  1   Preterm      AB      Living  1      SAB      IAB      Ectopic      Multiple      Live Births  1          Home Medications    Prior to Admission medications   Medication Sig Start Date End Date Taking? Authorizing Provider  acetaZOLAMIDE (DIAMOX) 125 MG tablet Take 125 mg by mouth 3 (three) times daily.   Yes [provider]  acetaminophen (TYLENOL) 650 MG CR tablet Take 650 mg by mouth as needed for pain.    [provider]  albuterol (VENTOLIN HFA) 108 (90 Base) MCG/ACT inhaler Inhale 1-2 puffs into the lungs every 6 (six) hours as needed. 12/12/22   Parrett, Virgel Bouquet, NP   EPINEPHrine 0.3 mg/0.3 mL IJ SOAJ injection Inject 0.3 mg into the muscle as needed for anaphylaxis. 11/15/22   Fargo, Amy E, NP  ezetimibe (ZETIA) 10 MG tablet Take 1 tablet (10 mg total) by mouth daily. 10/10/22   Sharon Seller, NP  levothyroxine (SYNTHROID) 150 MCG tablet Take 150 mcg by mouth daily before breakfast.    [provider]  loratadine (CLARITIN) 10 MG tablet Take 10 mg by mouth 3 (three) times a week.    [provider]  triamterene-hydrochlorothiazide (MAXZIDE) 75-50 MG tablet Take 1 tablet by mouth daily. 06/27/22   [provider]    Family History Family History  Problem Relation Age of Onset   Hypotension Mother    High blood pressure Mother        Per [facility] New Patient Packet   High Cholesterol Mother        Per [facility] New Patient Packet   Arthritis Mother        Per [facility] New Patient Packet   High blood pressure Brother        Per [facility] New Patient Packet   High Cholesterol Brother        Per [facility] New Patient Packet   Cancer Maternal Aunt        Per [facility] New Patient Packet   Diabetes Maternal Aunt    Diabetes Maternal Uncle        Per [facility] New Patient Packet   Breast cancer Maternal Grandmother    Diabetes Maternal Grandmother    Stroke Maternal Grandmother        Per [facility] New Patient Packet   Arthritis Maternal Grandmother        Per [facility] New Patient Packet   High blood pressure Son        Per [facility] New Patient Packet   High Cholesterol Son        Per [facility] New Patient Packet   Thyroid disease Son        Per [facility] New Patient Packet   Mood Disorder Son        Per [facility] New Patient Packet   Autism Son        Per [facility] New Patient Packet   Migraines Neg Hx    Social History Social History   Tobacco Use   Smoking status: Never   Smokeless tobacco: Never  Vaping Use   Vaping status: Never Used  Substance Use Topics   Alcohol use: No   Drug use: No   Allergies   Decadron [  dexamethasone], Prilosec [omeprazole], Simvastatin, and Vitamin  d analogs  Review of Systems Review of Systems  Gastrointestinal:  Positive for heartburn.   Pertinent findings revealed after performing a 14 point review of systems has been noted in the history of present illness.  Physical Exam Vital Signs BP 115/76 (BP Location: Right Arm)   Pulse 60   Temp 98.7 F (37.1 C) (Oral)   LMP 10/31/2019   SpO2 97%   No data found.  Physical Exam Vitals and nursing note reviewed.  Constitutional:      General: She is not in acute distress.    Appearance: Normal appearance.  HENT:     Head: Normocephalic and atraumatic.  Eyes:     Pupils: Pupils are equal, round, and reactive to light.  Cardiovascular:     Rate and Rhythm: Normal rate and regular rhythm.  Pulmonary:     Effort: Pulmonary effort is normal.     Breath sounds: Normal breath sounds.  Abdominal:     General: Abdomen is flat. Bowel sounds are decreased.     Palpations: Abdomen is soft.     Tenderness: There is abdominal tenderness in the epigastric area.  Musculoskeletal:        General: Normal range of motion.     Cervical back: Normal range of motion and neck supple.  Skin:    General: Skin is warm and dry.  Neurological:     General: No focal deficit present.     Mental Status: She is alert and oriented to person, place, and time. Mental status is at baseline.  Psychiatric:        Mood and Affect: Mood normal.        Behavior: Behavior normal.        Thought Content: Thought content normal.        Judgment: Judgment normal.     Visual Acuity Right Eye Distance:   Left Eye Distance:   Bilateral Distance:    Right Eye Near:   Left Eye Near:    Bilateral Near:     UC Couse / Diagnostics / Procedures:     Radiology No results found.  Procedures Procedures (including critical care time) EKG  Pending results:  Labs Reviewed - No data to display  Medications Ordered in UC: Medications - No data to display  UC Diagnoses / Final Clinical Impressions(s)    I have reviewed the triage vital signs and the nursing notes.  Pertinent labs & imaging results that were available during my care of the patient were reviewed by me and considered in my medical decision making (see chart for details).    Final diagnoses:  Gastroesophageal reflux disease with esophagitis without hemorrhage  Constipation, unspecified constipation type   Patient advised that Diamox can be very constipating, recommend daily MiraLAX while taking this medication.  Patient advised to continue famotidine as she has been and to begin Prevacid daily for relief of heartburn symptoms.  Patient was not given Carafate for rapid relief of heartburn symptoms due to patient reporting that it makes her stomach burn when she takes it.  Patient states that she plans to follow-up with her gastroenterologist and I agree with this.  Conservative care recommended.  Return precautions advised.  Please see discharge instructions below for details of plan of care as provided to patient. ED Prescriptions     Medication Sig Dispense Auth. Provider   lansoprazole (PREVACID) 30 MG capsule Take 1 capsule (30 mg total) by mouth in the  morning. 30 minutes prior to breakfast meal 30 capsule Theadora Rama Scales, PA-C      PDMP not reviewed this encounter.  Pending results:  Labs Reviewed - No data to display  Discharge Instructions:   Discharge Instructions      Your EKG today is normal and is therefore not the cause of the pain in your chest that you are experiencing at this time.  I have enclosed information about gastroesophageal reflux disease, food choices for gastroesophageal reflux disease and constipation that I hope you find helpful.    Please continue taking famotidine as you have been.  Diamox can be very constipating so it is important that you stay on top of having regular bowel movements.   Please read below to learn more about the medications, dosages and frequencies that I  recommend to help alleviate your symptoms and to get you feeling better soon:   Prevacid (lansoprazole): This medication is called a proton pump inhibitor (PPI).  When taken regularly, it reduces the amount of acid that your stomach produces thereby reducing symptoms of heartburn and allowing irritation in the stomach to heal more quickly.  This medication does not cure reflux but prevents the contents of reflux from irritating your esophagus which is what causes heartburn.  This medication works best when taken exactly as prescribed, not "as needed".  After reviewing your medication list, there are no drug to drug interactions between Prevacid and any of the other medications you are taking.  MiraLAX (polyethylene glycol): This is a very effective stool softener that works by pulling water into your stool increasing the volume of your stool and making the consistency of your stool very soft.  MiraLAX is not absorbed into the body at all, it remains entirely in your gastrointestinal track, it will not affect your blood sugar or electrolyte levels and it will not interfere with any of your current medications.  For prevention of constipation, I recommend that you take 1 capful (17 g) of the MiraLAX powder mixed into 8 ounces of water every morning whether you are feeling constipated or not.  I recommend that you take MiraLAX daily for 3 months to "retrain" your bowels.  During the first few days of taking MiraLAX, you may notice that you have more gas than usual.  This is normal and is the result of movement of gas that was previously trapped by retained stool.   If symptoms have not meaningfully improved in the next 5 to 7 days, please return for repeat evaluation or follow-up with your regular provider.  If symptoms have worsened in the next 3 to 5 days, please return for repeat evaluation or follow-up with your regular provider.    Thank you for visiting urgent care today.  We appreciate the opportunity to  participate in your care.       Disposition Upon Discharge:  Condition: stable for discharge home  Patient presented with an acute illness with associated systemic symptoms and significant discomfort requiring urgent management. In my opinion, this is a condition that a prudent lay person (someone who possesses an average knowledge of health and medicine) may potentially expect to result in complications if not addressed urgently such as respiratory distress, impairment of bodily function or dysfunction of bodily organs.   Routine symptom specific, illness specific and/or disease specific instructions were discussed with the patient and/or caregiver at length.   As such, the patient has been evaluated and assessed, work-up was performed and treatment was provided in  alignment with urgent care protocols and evidence based medicine.  Patient/parent/caregiver has been advised that the patient may require follow up for further testing and treatment if the symptoms continue in spite of treatment, as clinically indicated and appropriate.  Patient/parent/caregiver has been advised to return to the [facility] or PCP if no better; to PCP or the Emergency Department if new signs and symptoms develop, or if the current signs or symptoms continue to change or worsen for further workup, evaluation and treatment as clinically indicated and appropriate  The patient will follow up with their current PCP if and as advised. If the patient does not currently have a PCP we will assist them in obtaining one.   The patient may need specialty follow up if the symptoms continue, in spite of conservative treatment and management, for further workup, evaluation, consultation and treatment as clinically indicated and appropriate.  Patient/parent/caregiver verbalized understanding and agreement of plan as discussed.  All questions were addressed during visit.  Please see discharge instructions below for further details of  plan.  This office note has been dictated using Teaching laboratory technician.  Unfortunately, this method of dictation can sometimes lead to typographical or grammatical errors.  I apologize for your inconvenience in advance if this occurs.  Please do not hesitate to reach out to me if clarification is needed.      Theadora Rama Scales, PA-C 12/16/22 1257

## 2022-12-16 NOTE — ED Triage Notes (Signed)
Pt presents with burning in the middle of her chest after eating grapes and bananas 2 days ago. States she has discomfort in her chest.

## 2022-12-19 ENCOUNTER — Encounter: Payer: Self-pay | Admitting: Neurology

## 2022-12-19 ENCOUNTER — Encounter: Payer: Self-pay | Admitting: Orthopedic Surgery

## 2022-12-20 ENCOUNTER — Encounter: Payer: Self-pay | Admitting: Cardiology

## 2022-12-26 NOTE — Therapy (Deleted)
OUTPATIENT PHYSICAL THERAPY CERVICAL EVALUATION   Patient Name: Teresa Kent MRN: [id number] DOB:06-Jun-1965, 57 y.o., female Today's Date: 12/26/2022  END OF SESSION:   Past Medical History:  Diagnosis Date   Abnormal Pap smear 02/21/2003   Acne    Per records from [facility]   Acute sinusitis    Per Records from [facility]   Anemia    heavy menses   Arthritis    Per [facility] New Patient Packet   Cervicogenic headache    Per [facility] New Patient Packet   Colon cancer screening 03/10/2022   Per Records from [facility]   COVID    Per Records from [facility]   Elevated sed rate    Per records from [facility]   Elevated sed rate    Per Records from [facility]   Endometriosis 02/21/2004   Fever blister    Per records from [facility]   Fibroids, submucosal 02/21/2004   GERD (gastroesophageal reflux disease)    Per Records from [facility]   Goiter    Per records from [facility]   H/O: menorrhagia 02/21/2004   High cholesterol    Per [facility] New Patient Packet   Hx of mammogram 2024   Per [facility] New Patient Packet   Hypertension    Hypothyroidism    Idiopathic intracranial hypertension    Per Records from [facility]   Increased BMI 10/11/2010   Lower extremity edema    Per Records from [facility]   Morbid obesity [facility])    Per records from [facility]   Ovarian cyst, right 03/19/2004   Papanicolaou smear 09/2021   Per Records from [facility]   Postprocedural hypothyroidism    Per Records from [facility]   Simple endometrial hyperplasia 02/21/2004   Vitamin D deficiency    Per records from [facility]   Past Surgical History:  Procedure Laterality Date   DILATION AND CURETTAGE OF UTERUS  03/19/2004   DILITATION & CURRETTAGE/HYSTROSCOPY WITH NOVASURE ABLATION N/A 12/20/2012   Procedure: DILATATION & CURETTAGE/HYSTEROSCOPY WITH NOVASURE ABLATION;  Surgeon: Purcell Nails, MD;  Location:  [facility];  Service: Gynecology;  Laterality: N/A;   OVARY SURGERY     resection of fibroid  03/19/2004   THYROID SURGERY     TUBAL LIGATION     Patient Active Problem List   Diagnosis Date Noted   Dyspnea 12/12/2022   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   DDD (degenerative disc disease), cervical 11/15/2022   Osteoarthritis of cervical spine 07/05/2022   Cervicogenic headache 07/05/2022   Chronic intractable headache 06/07/2022   Cervical stenosis of spinal canal 06/07/2022   IIH (idiopathic intracranial hypertension) 04/19/2022   Hidradenitis suppurativa 12/19/2011    PCP: Sharon Seller, NP  REFERRING PROVIDER: Claria Dice, MD  REFERRING DIAG: [phone] (ICD-10-CM) - Spondylosis without myelopathy or radiculopathy, cervical region   THERAPY DIAG:  No diagnosis found.  Rationale for Evaluation and Treatment: Rehabilitation  ONSET DATE: 12/16/22  SUBJECTIVE:  SUBJECTIVE STATEMENT: *** Hand dominance: {MISC; OT HAND [phone]}  PERTINENT HISTORY:  Idiopathic Intracranial Hypertension and headaches,   PAIN:  Are you having pain? {OPRCPAIN:27236}  PRECAUTIONS: {Therapy precautions:24002}  RED FLAGS: {PT Red Flags:29287}     WEIGHT BEARING RESTRICTIONS: {Yes ***/No:24003}  FALLS:  Has patient fallen in last 6 months? 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   Lumbar region degenerative disc disease and degenerative facet disease as  outlined above, which could be a cause of lumbar region back pain. There is mild stenosis of the subarticular lateral recesses at L5-S1 and S1-S2, but no definite neural compression  IMPRESSION: Degenerative disc disease throughout the thoracic region including disc protrusions at T2-3, T3-4, T5-6, T6-7, T7-8 and T12-L1 as outlined above. These do not appear to cause neural compression but the findings in general could certainly relate to regional back pain.   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 No evidence of fracture or extraosseous extension of hemangioma. In the absence of fracture, this would not be expected to be symptomatic.  PATIENT SURVEYS:  {rehab surveys:24030}  COGNITION: Overall cognitive status: {cognition:24006}  SENSATION: {sensation:27233}  POSTURE: {posture:25561}  PALPATION: ***   CERVICAL ROM:   {AROM/PROM:27142} ROM A/PROM (deg) eval  Flexion   Extension   Right lateral flexion   Left lateral flexion   Right rotation   Left rotation    (Blank rows = not tested)  UPPER EXTREMITY ROM:  {AROM/PROM:27142} ROM Right eval Left eval  Shoulder flexion    Shoulder extension    Shoulder abduction    Shoulder adduction    Shoulder extension    Shoulder internal rotation    Shoulder external rotation    Elbow flexion    Elbow extension    Wrist flexion    Wrist extension    Wrist ulnar deviation    Wrist radial deviation    Wrist pronation    Wrist supination     (Blank rows = not tested)  UPPER EXTREMITY MMT:  MMT Right eval Left eval  Shoulder flexion    Shoulder extension    Shoulder abduction    Shoulder adduction    Shoulder extension    Shoulder internal rotation    Shoulder external rotation    Middle trapezius    Lower trapezius    Elbow flexion    Elbow extension    Wrist flexion    Wrist extension     Wrist ulnar deviation    Wrist radial deviation    Wrist pronation    Wrist supination    Grip strength     (Blank rows = not tested)  CERVICAL SPECIAL TESTS:  {Cervical special tests:25246}  FUNCTIONAL TESTS:  {Functional tests:24029}  TODAY'S TREATMENT:  DATE:  12/27/22    PATIENT EDUCATION:  Education details: *** Person educated: {Person educated:25204} Education method: {Education Method:25205} Education comprehension: {Education Comprehension:25206}  HOME EXERCISE PROGRAM: [id number]  ASSESSMENT:  CLINICAL IMPRESSION: Patient is a 57 y.o. who was seen today for physical therapy evaluation and treatment for ***.   OBJECTIVE IMPAIRMENTS: decreased activity tolerance, decreased balance, decreased coordination, decreased ROM, decreased strength, impaired flexibility, impaired sensation, impaired UE functional use, improper body mechanics, postural dysfunction, and pain.   ACTIVITY LIMITATIONS: carrying, lifting, reach over head, hygiene/grooming, and locomotion level  PARTICIPATION LIMITATIONS: meal prep, cleaning, laundry, shopping, and occupation  PERSONAL FACTORS: Past/current experiences are also affecting patient's functional outcome.   REHAB POTENTIAL: Good  CLINICAL DECISION MAKING: Evolving/moderate complexity  EVALUATION COMPLEXITY: Moderate   GOALS: Goals reviewed with patient? Yes  SHORT TERM GOALS: Target date: 01/13/23  I with initial HEP Baseline:  Goal status: INITIAL  2.  *** Baseline:  Goal status: INITIAL  3.  *** Baseline:  Goal status: INITIAL  4.  *** Baseline:  Goal status: INITIAL  5.  *** Baseline:  Goal status: INITIAL  6.  *** Baseline:  Goal status: INITIAL  LONG TERM GOALS: Target date: ***  I with final HEP Baseline:  Goal status: INITIAL  2.  *** Baseline:  Goal status: INITIAL  3.   *** Baseline:  Goal status: INITIAL  4.  *** Baseline:  Goal status: INITIAL  5.  *** Baseline:  Goal status: INITIAL  6.  *** Baseline:  Goal status: INITIAL   PLAN:  PT FREQUENCY: 1-2x/week  PT DURATION: 8 weeks  PLANNED INTERVENTIONS: Therapeutic exercises, Therapeutic activity, Neuromuscular re-education, Balance training, Gait training, Patient/Family education, Self Care, Joint mobilization, Dry Needling, Electrical stimulation, Spinal mobilization, Cryotherapy, Moist heat, Ionotophoresis 4mg /ml Dexamethasone, and Manual therapy  PLAN FOR NEXT SESSION: ***   Iona Beard, DPT 12/26/2022, 2:54 PM

## 2022-12-27 ENCOUNTER — Ambulatory Visit: Payer: Medicaid Other | Admitting: Physical Therapy

## 2022-12-27 ENCOUNTER — Ambulatory Visit: Payer: Medicaid Other | Admitting: Family

## 2022-12-27 VITALS — BP 110/80 | HR 63 | Temp 97.7°F | Resp 20 | Ht 71.0 in | Wt 195.0 lb

## 2022-12-27 DIAGNOSIS — R519 Headache, unspecified: Secondary | ICD-10-CM

## 2022-12-27 DIAGNOSIS — M545 Low back pain, unspecified: Secondary | ICD-10-CM | POA: Diagnosis not present

## 2022-12-27 DIAGNOSIS — G8929 Other chronic pain: Secondary | ICD-10-CM | POA: Diagnosis not present

## 2022-12-27 DIAGNOSIS — M2560 Stiffness of unspecified joint, not elsewhere classified: Secondary | ICD-10-CM

## 2022-12-27 LAB — SEDIMENTATION RATE: Sed Rate: 36 mm/h — ABNORMAL HIGH (ref 0–30)
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
## 2022-12-28 ENCOUNTER — Telehealth: Payer: Medicaid Other | Admitting: Physician Assistant

## 2022-12-28 ENCOUNTER — Inpatient Hospital Stay: Admission: RE | Admit: 2022-12-28 | Payer: Medicaid Other | Source: Ambulatory Visit

## 2022-12-28 DIAGNOSIS — M25512 Pain in left shoulder: Secondary | ICD-10-CM

## 2022-12-28 MED ORDER — PREDNISONE 10 MG (21) PO TBPK
ORAL_TABLET | ORAL | 0 refills | Status: DC
Start: 2022-12-28 — End: 2023-03-20

## 2022-12-28 NOTE — Progress Notes (Signed)
Virtual Visit Consent   Teresa Kent, you are scheduled for a virtual visit with a [facility] provider today. Just as with appointments in the office, your consent must be obtained to participate. Your consent will be active for this visit and any virtual visit you may have with one of our providers in the next 365 days. If you have a MyChart account, a copy of this consent can be sent to you electronically.  As this is a virtual visit, video technology does not allow for your provider to perform a traditional examination. This may limit your provider's ability to fully assess your condition. If your provider identifies any concerns that need to be evaluated in person or the need to arrange testing (such as labs, EKG, etc.), we will make arrangements to do so. Although advances in technology are sophisticated, we cannot ensure that it will always work on either your end or our end. If the connection with a video visit is poor, the visit may have to be switched to a telephone visit. With either a video or telephone visit, we are not always able to ensure that we have a secure connection.  By engaging in this virtual visit, you consent to the provision of healthcare and authorize for your insurance to be billed (if applicable) for the services provided during this visit. Depending on your insurance coverage, you may receive a charge related to this service.  I need to obtain your verbal consent now. Are you willing to proceed with your visit today? Teresa Kent has provided verbal consent on 12/28/2022 for a virtual visit (video or telephone). Piedad Climes, [place]  Date: 12/28/2022 10:10 AM  Virtual Visit via Video Note   I, Piedad Climes, connected with  Teresa Kent  ([id number], [birth year]) on 12/28/22 at 10:00 AM EDT by a video-enabled telemedicine application and verified that I am speaking with the correct person using two identifiers.  Location: Patient: Virtual Visit  Location Patient: Home Provider: Virtual Visit Location Provider: Home Office   I discussed the limitations of evaluation and management by telemedicine and the availability of in person appointments. The patient expressed understanding and agreed to proceed.    History of Present Illness: Teresa Kent is a 57 y.o. who identifies as a female who was assigned female at birth, and is being seen today for left shoulder pain -- lateral, throbbing, worse with abduction and IR/ER of shoulder. Denies known trauma or injury. Very painful to raise arm more than 90 degrees. Denies numbness, tingling or weakness. Has taken OTC Tylenol without much improvement of symptoms. This started overnight after she had routine visit with her PCP so was not addressed at that visit.  HPI: HPI  Problems:  Patient Active Problem List   Diagnosis Date Noted   Dyspnea 12/12/2022   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   DDD (degenerative disc disease), cervical 11/15/2022   Osteoarthritis of cervical spine 07/05/2022   Cervicogenic headache 07/05/2022   Chronic intractable headache 06/07/2022   Cervical stenosis of spinal canal 06/07/2022   IIH (idiopathic intracranial hypertension) 04/19/2022   Hidradenitis suppurativa 12/19/2011    Allergies:  Allergies  Allergen Reactions   Decadron [Dexamethasone] Other (See Comments)    Shaky    Prilosec [Omeprazole]     Chest pains   Simvastatin Other (See Comments)    Weakness, mind fog, and back pain    Vitamin D Analogs Other (See Comments)    Constipation with OTC products,  Per records from [facility]   Medications:  Current Outpatient Medications:    predniSONE (STERAPRED UNI-PAK 21 TAB) 10 MG (21) TBPK tablet, Take following package directions, Disp: 21 tablet, Rfl: 0   acetaminophen (TYLENOL) 650 MG CR tablet, Take 650 mg by mouth as needed for pain., Disp: , Rfl:    acetaZOLAMIDE (DIAMOX) 125 MG tablet, Take 125 mg by mouth 3 (three) times daily.,  Disp: , Rfl:    albuterol (VENTOLIN HFA) 108 (90 Base) MCG/ACT inhaler, Inhale 1-2 puffs into the lungs every 6 (six) hours as needed., Disp: 8 g, Rfl: 2   EPINEPHrine 0.3 mg/0.3 mL IJ SOAJ injection, Inject 0.3 mg into the muscle as needed for anaphylaxis., Disp: 1 each, Rfl: 0   ezetimibe (ZETIA) 10 MG tablet, Take 1 tablet (10 mg total) by mouth daily., Disp: 90 tablet, Rfl: 3   lansoprazole (PREVACID) 30 MG capsule, Take 1 capsule (30 mg total) by mouth in the morning. 30 minutes prior to breakfast meal, Disp: 30 capsule, Rfl: 2   levothyroxine (SYNTHROID) 150 MCG tablet, Take 150 mcg by mouth daily before breakfast., Disp: , Rfl:    loratadine (CLARITIN) 10 MG tablet, Take 10 mg by mouth 3 (three) times a week., Disp: , Rfl:    triamterene-hydrochlorothiazide (MAXZIDE) 75-50 MG tablet, Take 1 tablet by mouth daily., Disp: , Rfl:   Observations/Objective: Patient is well-developed, well-nourished in no acute distress.  Resting comfortably at home.  Head is normocephalic, atraumatic.  No labored breathing. Speech is clear and coherent with logical content.  Patient is alert and oriented at baseline.  Able to abduct the arm to about 60 degrees before noting pain. Can raise further but with significant pain.   Assessment and Plan: 1. Acute pain of left shoulder - predniSONE (STERAPRED UNI-PAK 21 TAB) 10 MG (21) TBPK tablet; Take following package directions  Dispense: 21 tablet; Refill: 0  Concern for impingement. Supportive measures and OTC medications reviewed. Tylenol OTC. Prednisone per orders. PCP follow-up for any non-resolving, new or worsening symptoms.   Follow Up Instructions: I discussed the assessment and treatment plan with the patient. The patient was provided an opportunity to ask questions and all were answered. The patient agreed with the plan and demonstrated an understanding of the instructions.  A copy of instructions were sent to the patient via MyChart unless otherwise  noted below.   The patient was advised to call back or seek an in-person evaluation if the symptoms worsen or if the condition fails to improve as anticipated.  Time:  I spent 10 minutes with the patient via telehealth technology discussing the above problems/concerns.    Piedad Climes, PA-C

## 2022-12-28 NOTE — Patient Instructions (Signed)
  Teresa Kent, thank you for joining Piedad Climes, PA-C for today's virtual visit.  While this provider is not your primary care provider (PCP), if your PCP is located in our provider database this encounter information will be shared with them immediately following your visit.   A [place] [facility] MyChart account gives you access to today's visit and all your visits, tests, and labs performed at [facility] " click here if you don't have a [facility] MyChart account or go to [email or web address]  Consent: (Patient) Teresa Kent provided verbal consent for this virtual visit at the beginning of the encounter.  Current Medications:  Current Outpatient Medications:    acetaminophen (TYLENOL) 650 MG CR tablet, Take 650 mg by mouth as needed for pain., Disp: , Rfl:    acetaZOLAMIDE (DIAMOX) 125 MG tablet, Take 125 mg by mouth 3 (three) times daily., Disp: , Rfl:    albuterol (VENTOLIN HFA) 108 (90 Base) MCG/ACT inhaler, Inhale 1-2 puffs into the lungs every 6 (six) hours as needed., Disp: 8 g, Rfl: 2   EPINEPHrine 0.3 mg/0.3 mL IJ SOAJ injection, Inject 0.3 mg into the muscle as needed for anaphylaxis., Disp: 1 each, Rfl: 0   ezetimibe (ZETIA) 10 MG tablet, Take 1 tablet (10 mg total) by mouth daily., Disp: 90 tablet, Rfl: 3   lansoprazole (PREVACID) 30 MG capsule, Take 1 capsule (30 mg total) by mouth in the morning. 30 minutes prior to breakfast meal, Disp: 30 capsule, Rfl: 2   levothyroxine (SYNTHROID) 150 MCG tablet, Take 150 mcg by mouth daily before breakfast., Disp: , Rfl:    loratadine (CLARITIN) 10 MG tablet, Take 10 mg by mouth 3 (three) times a week., Disp: , Rfl:    triamterene-hydrochlorothiazide (MAXZIDE) 75-50 MG tablet, Take 1 tablet by mouth daily., Disp: , Rfl:    Medications ordered in this encounter:  No orders of the defined types were placed in this encounter.    *If you need refills on other medications prior to your next appointment, please  contact your pharmacy*  Follow-Up: Call back or seek an in-person evaluation if the symptoms worsen or if the condition fails to improve as anticipated.  [facility] [phone]  Other Instructions Avoid heavy lifting and overexertion. Ok to continue tylenol. Can also do a heating pad for 10-15 minutes at a time. Take the prednisone as directed. Make sure to schedule a follow-up with your PCP.    If you have been instructed to have an in-person evaluation today at a local Urgent Care facility, please use the link below. It will take you to a list of all of our available [facility], including address, phone number and hours of operation. Please do not delay care.  [facility]  If you or a family member do not have a primary care provider, use the link below to schedule a visit and establish care. When you choose a [facility] primary care physician or advanced practice provider, you gain a long-term partner in health. Find a Primary Care Provider  Learn more about [facility]'s in-office and virtual care options: [facility] - Get Care Now

## 2022-12-30 ENCOUNTER — Encounter: Payer: Self-pay | Admitting: Neurology

## 2023-01-04 ENCOUNTER — Ambulatory Visit: Payer: Medicaid Other | Attending: Physical Medicine and Rehabilitation

## 2023-01-04 ENCOUNTER — Other Ambulatory Visit: Payer: Self-pay | Admitting: Family

## 2023-01-04 DIAGNOSIS — M2560 Stiffness of unspecified joint, not elsewhere classified: Secondary | ICD-10-CM

## 2023-01-06 ENCOUNTER — Other Ambulatory Visit: Payer: Self-pay | Admitting: Surgery

## 2023-01-06 DIAGNOSIS — M4802 Spinal stenosis, cervical region: Secondary | ICD-10-CM

## 2023-01-11 ENCOUNTER — Telehealth: Payer: Self-pay | Admitting: Family

## 2023-01-11 ENCOUNTER — Encounter: Payer: Self-pay | Admitting: Neurology

## 2023-01-11 NOTE — Telephone Encounter (Signed)
Sorry to hear about the rheumatology situation.follow up with Neurology as scheduled.Recommend referral to pain management.

## 2023-01-11 NOTE — Telephone Encounter (Signed)
Patient called and is extremely frustrated that her rheumatology referral was denied. She did get an appointment with a neurologist but they couldn't see her until December. Patient is in pain and unhappy about the situation, and just wanted me to let you know.

## 2023-01-16 ENCOUNTER — Ambulatory Visit
Admission: RE | Admit: 2023-01-16 | Discharge: 2023-01-16 | Disposition: A | Discharge status: Home / Self Care | Payer: Medicaid Other | Source: Ambulatory Visit | Attending: Adult Health | Admitting: Adult Health

## 2023-01-16 DIAGNOSIS — R0600 Dyspnea, unspecified: Secondary | ICD-10-CM

## 2023-01-20 ENCOUNTER — Other Ambulatory Visit: Payer: Self-pay | Admitting: Family

## 2023-01-20 DIAGNOSIS — M4802 Spinal stenosis, cervical region: Secondary | ICD-10-CM

## 2023-01-24 ENCOUNTER — Telehealth: Payer: Self-pay | Admitting: Family

## 2023-01-24 NOTE — Telephone Encounter (Signed)
Can you please forward Neurologist referral to another Neurologist instead of [facility] Neurologist as requested by patient.

## 2023-01-25 ENCOUNTER — Ambulatory Visit: Payer: Medicaid Other | Admitting: Neurology

## 2023-01-31 ENCOUNTER — Ambulatory Visit (INDEPENDENT_AMBULATORY_CARE_PROVIDER_SITE_OTHER): Payer: Medicaid Other | Admitting: Dermatology

## 2023-01-31 ENCOUNTER — Encounter: Payer: Self-pay | Admitting: Dermatology

## 2023-01-31 VITALS — BP 107/71 | HR 60

## 2023-01-31 DIAGNOSIS — Z7189 Other specified counseling: Secondary | ICD-10-CM

## 2023-01-31 DIAGNOSIS — L308 Other specified dermatitis: Secondary | ICD-10-CM | POA: Diagnosis not present

## 2023-01-31 DIAGNOSIS — L209 Atopic dermatitis, unspecified: Secondary | ICD-10-CM

## 2023-01-31 DIAGNOSIS — Z8639 Personal history of other endocrine, nutritional and metabolic disease: Secondary | ICD-10-CM | POA: Insufficient documentation

## 2023-01-31 MED ORDER — TRIAMCINOLONE ACETONIDE 0.1 % EX CREA
1.0000 | TOPICAL_CREAM | Freq: Two times a day (BID) | CUTANEOUS | 3 refills | Status: AC
Start: 2023-01-31 — End: ?

## 2023-01-31 NOTE — Progress Notes (Signed)
   New Patient Visit   Subjective  Teresa Kent is a 57 y.o. female who presents for the following: Rash  Patient states she has a rash located at the face that she would like to have examined. Patient reports the areas have been there for 1 month. She reports the areas are bothersome. She reports her sxs as itchy. Patient rates irritation 8 out of 10. She states that the areas have spread. Patient reports she has previously been treated for these areas.She reports she was prescribed a prednisone taper which resolved the issue but now has areas appearing through out her body. Patient denies Hx of bx. Patient denies family history of skin cancer(s). Patient reports unknown triggers. She states she has sensitive skin so she keeps her skin products very minimal. Patient denies any autoimmune disorders, but has never been formally tested.  The patient has spots, moles and lesions to be evaluated, some may be new or changing and the patient may have concern these could be cancer.   The following portions of the chart were reviewed this encounter and updated as appropriate: medications, allergies, medical history  Review of Systems:  No other skin or systemic complaints except as noted in HPI or Assessment and Plan.  Objective  Well appearing patient in no apparent distress; mood and affect are within normal limits.  A focused examination was performed of the following areas: Face, Chest, Breast  Relevant exam findings are noted in the Assessment and Plan.   Assessment & Plan   ECZEMATOUS DERMATITIS Exam: Pink papules coalescing to plaques  Flared  Atopic dermatitis (eczema) is a chronic, relapsing, pruritic condition that can significantly affect quality of life. It is often associated with allergic rhinitis and/or asthma and can require treatment with topical medications, phototherapy, or in severe cases biologic injectable medication (Dupixent; Adbry) or Oral JAK  inhibitors.  Treatment Plan: - We will prescribe Triamcinolone to apply 2 times daily for 2 weeks then stop - Recommended changing to Dove anti-bacterial body wash - Advised to continue Eucerin Itch Relief lotion - Recommend gentle skin care. - Advised to follow up PRN for uncontrolled flares  2. Inflammation Management -Assessment:  Reviewed pt's labs with her and explained that while her inflammatory markers (ESR, CRP) were mildly elevated, and the screening test for Lupus (ANA titer) was not significantly elevated she does not quite fit the profile for an autoimmune condition thus the Rheumatologist keeps denying her referral.  However, just because the lupus marker was not significant that does not mean her body is not under hightened inflammation.  I recommend she research and find a function medicine doctor who focuses on health maintenance and prevention to better help her. In the meantime she should watch her diet to make sure she's consuming enough antioxidants and avoiding inflammatory foods.  Food probably isn't the cause but it can make things worse.  - Plan: Encourage the patient to increase antioxidant intake through green leafy vegetables, blueberries, strawberries, and dark pigmented fruits. Recommend minimizing sugar intake. Advise the patient to take a vitamin D supplement and a general multivitamin daily.  Follow-up as needed for any unresolved or worsening issues.   No follow-ups on file.  Documentation: I have reviewed the above documentation for accuracy and completeness, and I agree with the above.  Stasia Cavalier, am acting as scribe for Langston Reusing, DO.  Langston Reusing, DO

## 2023-01-31 NOTE — Patient Instructions (Addendum)
Hello Teresa Kent,  Thank you for visiting my office today. Your dedication to addressing your dermatological concerns and improving your overall health is greatly appreciated. Below is a summary of our discussion and the care instructions provided:  - Medication: Start applying Triamcinolone cream to the affected itchy areas on body twice daily.   - Duration: Continue this application for up to two weeks.   - Note: Discontinue use after two weeks to avoid the risk of skin thinning.  - Skincare:   - Body Wash: Switch to Dove unscented soap for body washing, avoiding use underarms and in the groin area, to help prevent eczema flares.   - Moisturizing: Continue to apply Eucerin moisturizer within 10 minutes of showering to lock in moisture.  - Follow-Up:   - Instructions: If symptoms do not improve with the use of Triamcinolone cream, or if you experience frequent flares, please return for further evaluation.   - Otherwise: Follow up as needed.  - Diet and Supplements:   - Diet: Increase your intake of antioxidants through green leafy vegetables, blueberries, strawberries, and other dark pigmented fruits. Minimize sugar intake.   - Supplements: Consider taking a vitamin D supplement and a general multivitamin daily.  Please do not hesitate to reach out if you have any further questions or concerns.  Warm regards,  Dr. [facility]   Important Information  Due to recent changes in healthcare laws, you may see results of your pathology and/or laboratory studies on MyChart before the doctors have had a chance to review them. We understand that in some cases there may be results that are confusing or concerning to you. Please understand that not all results are received at the same time and often the doctors may need to interpret multiple results in order to provide you with the best plan of care or course of treatment. Therefore, we ask that you please give [place] 2 business days to  thoroughly review all your results before contacting the office for clarification. Should we see a critical lab result, you will be contacted sooner.   If You Need Anything After Your Visit  If you have any questions or concerns for your doctor, please call our main line at [phone] If no one answers, please leave a voicemail as directed and we will return your call as soon as possible. Messages left after 4 pm will be answered the following business day.   You may also send [place] a message via MyChart. We typically respond to MyChart messages within 1-2 business days.  For prescription refills, please ask your pharmacy to contact our office. Our fax number is [phone].  If you have an urgent issue when the clinic is closed that cannot wait until the next business day, you can page your doctor at the number below.    Please note that while we do our best to be available for urgent issues outside of office hours, we are not available 24/7.   If you have an urgent issue and are unable to reach [place], you may choose to seek medical care at your doctor's office, retail clinic, urgent care center, or emergency room.  If you have a medical emergency, please immediately call 911 or go to the emergency department. In the event of inclement weather, please call our main line at [phone] for an update on the status of any delays or closures.  Dermatology Medication Tips: Please keep the boxes that topical medications come in in order to help keep  track of the instructions about where and how to use these. Pharmacies typically print the medication instructions only on the boxes and not directly on the medication tubes.   If your medication is too expensive, please contact our office at [phone] or send [place] a message through MyChart.   We are unable to tell what your co-pay for medications will be in advance as this is different depending on your insurance coverage. However, we may be able to  find a substitute medication at lower cost or fill out paperwork to get insurance to cover a needed medication.   If a prior authorization is required to get your medication covered by your insurance company, please allow [place] 1-2 business days to complete this process.  Drug prices often vary depending on where the prescription is filled and some pharmacies may offer cheaper prices.  The website www.goodrx.com contains coupons for medications through different pharmacies. The prices here do not account for what the cost may be with help from insurance (it may be cheaper with your insurance), but the website can give you the price if you did not use any insurance.  - You can print the associated coupon and take it with your prescription to the pharmacy.  - You may also stop by our office during regular business hours and pick up a GoodRx coupon card.  - If you need your prescription sent electronically to a different pharmacy, notify our office through [facility] or by phone at [phone]

## 2023-02-01 ENCOUNTER — Other Ambulatory Visit: Payer: Self-pay | Admitting: Family

## 2023-02-01 DIAGNOSIS — R768 Other specified abnormal immunological findings in serum: Secondary | ICD-10-CM

## 2023-02-01 DIAGNOSIS — M2560 Stiffness of unspecified joint, not elsewhere classified: Secondary | ICD-10-CM

## 2023-02-01 DIAGNOSIS — M25541 Pain in joints of right hand: Secondary | ICD-10-CM

## 2023-02-02 ENCOUNTER — Ambulatory Visit
Admission: RE | Admit: 2023-02-02 | Discharge: 2023-02-02 | Disposition: A | Discharge status: Home / Self Care | Payer: Medicaid Other | Source: Ambulatory Visit | Attending: Surgery | Admitting: Surgery

## 2023-02-02 DIAGNOSIS — M4802 Spinal stenosis, cervical region: Secondary | ICD-10-CM

## 2023-02-13 ENCOUNTER — Other Ambulatory Visit (HOSPITAL_COMMUNITY): Payer: Self-pay | Admitting: Gastroenterology

## 2023-02-13 DIAGNOSIS — R110 Nausea: Secondary | ICD-10-CM

## 2023-03-14 ENCOUNTER — Ambulatory Visit (INDEPENDENT_AMBULATORY_CARE_PROVIDER_SITE_OTHER): Payer: Medicaid Other | Admitting: Adult Health

## 2023-03-14 ENCOUNTER — Encounter: Payer: Self-pay | Admitting: Adult Health

## 2023-03-14 VITALS — BP 106/62 | HR 59 | Temp 98.2°F | Ht 71.0 in | Wt 194.4 lb

## 2023-03-14 DIAGNOSIS — R0609 Other forms of dyspnea: Secondary | ICD-10-CM

## 2023-03-14 DIAGNOSIS — J4520 Mild intermittent asthma, uncomplicated: Secondary | ICD-10-CM | POA: Diagnosis not present

## 2023-03-14 DIAGNOSIS — J45909 Unspecified asthma, uncomplicated: Secondary | ICD-10-CM | POA: Insufficient documentation

## 2023-03-14 NOTE — Assessment & Plan Note (Signed)
Mild intermittent asthma.  Under good control.  Albuterol as needed

## 2023-03-14 NOTE — Assessment & Plan Note (Signed)
Intermittent episodes of dyspnea since January 2024 after COVID-19 infection.  Workup has been unrevealing.  Unfortunate was unable to do PFTs.  High-resolution CT chest was negative for interstitial lung disease.  No acute process noted.  Labs showed normal hemoglobin hematocrit.  May have had a component of reactive airways/intermittent asthma.  Seems to have improved.  Advised to use albuterol as needed. Cardiac workup was unrevealing as well. Plan  Patient Instructions  Albuterol inhaler 1-2 puffs every 6 hr as needed for dyspnea.  Zyrtec 5mg  At bedtime  As needed  allergy symptoms .  Follow up with Dr. Judeth Horn or Keonta Alsip NP (30 min slot) in 1 year and As needed

## 2023-03-14 NOTE — Progress Notes (Signed)
@Patient  ID: Teresa Kent, female    DOB: [birth year], 57 y.o.   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 Was 800. ESR 44 , CRP was 12.5 .  10/2022 eosinophils abs 200.  Echo 07/18/22 EF 60-65%, RVSF nml , RV size nml.  May 04, 2023 showed normal hemoglobin hematocrit.  Coronary CT chest May 19, 2022 showed a coronary calcium score of 0.  Visualized portions of the lung were clear.   03/14/2023 Follow up : Dyspnea  Patient presents for a 76-month follow-up.  Patient has been evaluated for ongoing shortness of breath since January 2024 after having COVID-19 infection.  Patient was given Breztri inhaler for suspected underlying asthma. Had no perceived benefits.  Chest x-ray in December 2023 showed clear lungs.  Previous coronary CT chest visualized portions of the lungs were clear.  She was recommended for pulmonary function testing but was unable to complete and then declined going for repeat testing.  She was set up for high-resolution CT chest January 16, 2023 that was normal with no evidence of interstitial lung disease.  Clear lungs.  There was a small left lower lobe pulmonary nodule measuring 0.3 cm stable from CT in 2018 consistent with a benign etiology.  Since last visit she is feeling better.  Shortness of breath is slowly improved.  Feels that she is not really short of breath anymore.  She has no cough or wheezing.  No use of albuterol.    Allergies  Allergen Reactions   Decadron [Dexamethasone] Other (See Comments)    Shaky    Prilosec [Omeprazole]     Chest pains   Simvastatin Other (See Comments)    Weakness, mind fog, and back pain    Vitamin  D Analogs Other (See Comments)    Constipation with OTC products, Per records from [facility]    Immunization History  Administered Date(s) Administered   DTaP / Hep B / IPV 01/09/1968, 04/19/1972   Influenza,inj,Quad PF,6+ Mos 02/22/2022   Influenza-Unspecified 03/07/2019   MMR 12/17/1992   Td 12/15/1993   Tdap 07/02/2010   Unspecified SARS-COV-2 Vaccination 08/12/2019, 09/02/2019, 05/30/2020   Zoster Recombinant(Shingrix) 04/07/2020, 07/16/2020    Past Medical History:  Diagnosis Date   Abnormal Pap smear 02/21/2003   Acne    Per records from [facility]   Acute sinusitis    Per Records from [facility]   Anemia    heavy menses   Arthritis    Per [facility] New Patient Packet   Cervicogenic headache    Per [facility] New Patient Packet   Colon cancer screening 03/10/2022   Per Records from [facility]   COVID    Per Records from [facility]   Elevated sed rate    Per records from [facility]   Elevated sed rate    Per Records from [facility]   Endometriosis 02/21/2004   Fever blister    Per records from [facility]   Fibroids, submucosal 02/21/2004   GERD (gastroesophageal reflux disease)    Per Records from [facility]   Goiter    Per records from [facility]   H/O: menorrhagia 02/21/2004   High cholesterol    Per  [facility] New Patient Packet   Hx of mammogram 2024   Per [facility] New Patient Packet   Hypertension    Hypothyroidism    Idiopathic intracranial hypertension    Per Records from [facility]   Increased BMI 10/11/2010   Lower extremity edema    Per Records from [facility]   Morbid obesity [facility])    Per records from [facility]   Ovarian cyst, right 03/19/2004   Papanicolaou smear 09/2021   Per Records from [facility]   Postprocedural hypothyroidism    Per Records from [facility]   Simple endometrial hyperplasia 02/21/2004   Vitamin D deficiency    Per records from [facility]     Tobacco History: Social History   Tobacco Use  Smoking Status Never   Passive exposure: Never  Smokeless Tobacco Never   Counseling given: Not Answered   Outpatient Medications Prior to Visit  Medication Sig Dispense Refill   acetaminophen (TYLENOL) 650 MG CR tablet Take 650 mg by mouth as needed for pain.     acetaZOLAMIDE (DIAMOX) 125 MG tablet Take 125 mg by mouth 3 (three) times daily.     albuterol (VENTOLIN HFA) 108 (90 Base) MCG/ACT inhaler Inhale 1-2 puffs into the lungs every 6 (six) hours as needed. 8 g 2   EPINEPHrine 0.3 mg/0.3 mL IJ SOAJ injection Inject 0.3 mg into the muscle as needed for anaphylaxis. 1 each 0   levothyroxine (SYNTHROID) 150 MCG tablet Take 150 mcg by mouth daily before breakfast.     loratadine (CLARITIN) 10 MG tablet Take 10 mg by mouth 3 (three) times a week.     meloxicam (MOBIC) 7.5 MG tablet Take 7.5 mg by mouth daily.     predniSONE (STERAPRED UNI-PAK 21 TAB) 10 MG (21) TBPK tablet Take following package directions 21 tablet 0   tiZANidine (ZANAFLEX) 4 MG tablet Take 4 mg by mouth 2 (two) times daily.     triamcinolone cream (KENALOG) 0.1 % Apply 1 Application topically 2 (two) times daily. 80 g 3   triamterene-hydrochlorothiazide (MAXZIDE) 75-50 MG tablet Take 1 tablet by mouth daily.     No facility-administered medications prior to visit.     Review of Systems:   Constitutional:   No  weight loss, night sweats,  Fevers, chills, fatigue, or  lassitude.  HEENT:   No headaches,  Difficulty swallowing,  Tooth/dental problems, or  Sore throat,                No sneezing, itching, ear ache, nasal congestion, post nasal drip,   CV:  No chest pain,  Orthopnea, PND, swelling in lower extremities, anasarca, dizziness, palpitations, syncope.   GI  No heartburn, indigestion, abdominal pain, nausea, vomiting, diarrhea, change in bowel habits, loss of appetite, bloody stools.   Resp: No shortness of breath with exertion or at rest.  No excess  mucus, no productive cough,  No non-productive cough,  No coughing up of blood.  No change in color of mucus.  No wheezing.  No chest wall deformity  Skin: no rash or lesions.  GU: no dysuria, change in color of urine, no urgency or frequency.  No flank pain, no hematuria   MS:  No joint pain or swelling.  No decreased range of motion.  No back pain.    Physical Exam  BP 106/62 (BP Location: Left Arm, Cuff Size: Large)   Pulse (!) 59   Temp 98.2 F (36.8 C)   Ht 5\' 11"  (  1.803 m)   Wt 194 lb 6.4 oz (88.2 kg)   LMP 10/31/2019   SpO2 100%   BMI 27.11 kg/m   GEN: A/Ox3; pleasant , NAD, well nourished    HEENT:  [place]/AT,   NOSE-clear, THROAT-clear, no lesions, no postnasal drip or exudate noted.   NECK:  Supple w/ fair ROM; no JVD; normal carotid impulses w/o bruits; no thyromegaly or nodules palpated; no lymphadenopathy.    RESP  Clear  P & A; w/o, wheezes/ rales/ or rhonchi. no accessory muscle use, no dullness to percussion  CARD:  RRR, no m/r/g, no peripheral edema, pulses intact, no cyanosis or clubbing.  GI:   Soft & nt; nml bowel sounds; no organomegaly or masses detected.   Musco: Warm bil, no deformities or joint swelling noted.   Neuro: alert, no focal deficits noted.    Skin: Warm, no lesions or rashes    Lab Results:  CBC    Component Value Date/Time   WBC 4.5 11/02/2022 0834   WBC 4.4 10/06/2022 1354   RBC 4.62 11/02/2022 0834   RBC 4.94 10/06/2022 1354   HGB 13.1 11/02/2022 0834   HCT 41.3 11/02/2022 0834   PLT 309 11/02/2022 0834   MCV 89 11/02/2022 0834   MCH 28.4 11/02/2022 0834   MCH 28.7 10/06/2022 1354   MCHC 31.7 11/02/2022 0834   MCHC 32.1 10/06/2022 1354   RDW 11.8 11/02/2022 0834   LYMPHSABS 1.9 11/02/2022 0834   MONOABS 0.4 10/06/2022 1354   EOSABS 0.2 11/02/2022 0834   BASOSABS 0.1 11/02/2022 0834    BMET    Component Value Date/Time   NA 144 11/02/2022 0834   K 3.8 11/02/2022 0834   CL 106 11/02/2022 0834   CO2 25 11/02/2022  0834   GLUCOSE 83 11/02/2022 0834   GLUCOSE 90 10/06/2022 1354   BUN 9 11/02/2022 0834   CREATININE 0.85 11/02/2022 0834   CREATININE 0.67 08/12/2022 0912   CALCIUM 9.2 11/02/2022 0834   GFRNONAA >60 10/06/2022 1354   GFRAA >90 12/18/2012 1100    BNP No results found for: "BNP"  ProBNP No results found for: "PROBNP"  Imaging: No results found.  Administration History     None           No data to display          No results found for: "NITRICOXIDE"      Assessment & Plan:   Dyspnea Intermittent episodes of dyspnea since January 2024 after COVID-19 infection.  Workup has been unrevealing.  Unfortunate was unable to do PFTs.  High-resolution CT chest was negative for interstitial lung disease.  No acute process noted.  Labs showed normal hemoglobin hematocrit.  May have had a component of reactive airways/intermittent asthma.  Seems to have improved.  Advised to use albuterol as needed. Cardiac workup was unrevealing as well. Plan  Patient Instructions  Albuterol inhaler 1-2 puffs every 6 hr as needed for dyspnea.  Zyrtec 5mg  At bedtime  As needed  allergy symptoms .  Follow up with Dr. Judeth Horn or Quintin Hjort NP (30 min slot) in 1 year and As needed      Asthma Mild intermittent asthma.  Under good control.  Albuterol as needed     Rubye Oaks, NP 03/14/2023

## 2023-03-14 NOTE — Patient Instructions (Addendum)
Albuterol inhaler 1-2 puffs every 6 hr as needed for dyspnea.  Zyrtec 5mg  At bedtime  As needed  allergy symptoms .  Follow up with Dr. Judeth Horn or Demetric Parslow NP (30 min slot) in 1 year and As needed

## 2023-03-17 NOTE — Progress Notes (Unsigned)
NEUROLOGY CONSULTATION NOTE  Teresa Kent MRN: [id number] DOB: January 16, 1966  Referring provider: Abbey Chatters, NP Primary care provider: Abbey Chatters, NP  Reason for consult:  migraines  Assessment/Plan:   ***   Subjective:  Teresa Kent is a 57 year old ***-handed female with hypertension and hypothyroidism who presents for migraines and idiopathic intracranial hypertension.  History supplemented by prior neurologist's and referring provider's notes.  In 2023, she started experiencing head pressure, left sided headache, neck pressure , tinnitus and blurred vision.  Saw ophthalmology ***.  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  She was started on Diamox.  Underwent LP on 04/25/2022 which revealed normal opening pressure of 10 cm water.  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  Ophthalmology exam on 05/03/2022 showed no evidence of papilledema.  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    Reported side effects to Diamox.  Plan was to switch to topiramate but she declined due to the potential side effects, so she continued Diamox on lower dose of 125mg  daily to twice daily.  She was seen in the headache center at [facility] in January 2024 who believed her headaches were cervicogenic and not due to IIH.  Recommended pain management for facet injections and median branch block and to discontinue Diamox.  MRI of C-spine ***.  She had been exhibiting elevated sed rate and CRP (*** and 12.5  respectively).  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  She had a temporal artery biopsy ***.  Diamox was discontinued and she had a repeat LP on 06/28/2022 which this time revealed opening pressure of 19 cm water.  She saw neurosurgery at [facility] for possible stent.  Arterial angiogram did not demonstrate any vascular abnormalities and venous angiogram did not demonstrate any significant pressure gradient but did demonstrate stenosis at the right transverse/sigmoid junction.  He explained that a stent would eliminate the pulsatile tinnitus but not significantly affect her intracranial pressure and recommended VP shunt.  ***  Past NSAIDS/analgesics:  *** Past abortive triptans:  *** Past abortive ergotamine:  *** Past muscle relaxants:  *** Past anti-emetic:  *** Past antihypertensive medications:  *** Past antidepressant medications:  *** Past anticonvulsant medications:  *** Past anti-CGRP:  *** Past vitamins/Herbal/Supplements:  *** Past antihistamines/decongestants:  *** Other past therapies:  ***  Current NSAIDS/analgesics:  *** Current triptans:  *** Current ergotamine:  *** Current anti-emetic:  *** Current muscle relaxants:  *** Current Antihypertensive medications:  *** Current Antidepressant medications:  *** Current Anticonvulsant medications:  *** Current anti-CGRP:  *** Current Vitamins/Herbal/Supplements:  *** Current Antihistamines/Decongestants:  *** Other therapy:  *** Birth control:  *** Other medications:  ***   Caffeine:  *** Alcohol:  *** Smoker:  *** Diet:  *** Exercise:  *** Depression:  ***; Anxiety:  *** Other pain:  *** Sleep hygiene:  *** Family history of headache:  ***      PAST MEDICAL HISTORY: Past Medical History:  Diagnosis Date   Abnormal Pap smear 02/21/2003   Acne    Per records from [facility]  Acute sinusitis    Per Records from [facility]   Anemia    heavy menses   Arthritis    Per [facility] New Patient  Packet   Cervicogenic headache    Per [facility] New Patient Packet   Colon cancer screening 03/10/2022   Per Records from [facility]   COVID    Per Records from [facility]   Elevated sed rate    Per records from [facility]   Elevated sed rate    Per Records from [facility]   Endometriosis 02/21/2004   Fever blister    Per records from [facility]   Fibroids, submucosal 02/21/2004   GERD (gastroesophageal reflux disease)    Per Records from [facility]   Goiter    Per records from [facility]   H/O: menorrhagia 02/21/2004   High cholesterol    Per [facility] New Patient Packet   Hx of mammogram 2024   Per [facility] New Patient Packet   Hypertension    Hypothyroidism    Idiopathic intracranial hypertension    Per Records from [facility]   Increased BMI 10/11/2010   Lower extremity edema    Per Records from [facility]   Morbid obesity [facility])    Per records from [facility]   Ovarian cyst, right 03/19/2004   Papanicolaou smear 09/2021   Per Records from [facility]   Postprocedural hypothyroidism    Per Records from [facility]   Simple endometrial hyperplasia 02/21/2004   Vitamin D deficiency    Per records from [facility]    PAST SURGICAL HISTORY: Past Surgical History:  Procedure Laterality Date   DILATION AND CURETTAGE OF UTERUS  03/19/2004   DILITATION & CURRETTAGE/HYSTROSCOPY WITH NOVASURE ABLATION N/A 12/20/2012   Procedure: DILATATION & CURETTAGE/HYSTEROSCOPY WITH NOVASURE ABLATION;  Surgeon: Purcell Nails, MD;  Location: [facility];  Service: Gynecology;  Laterality: N/A;   OVARY SURGERY     resection of fibroid  03/19/2004   THYROID SURGERY     TUBAL LIGATION      MEDICATIONS: Current Outpatient Medications on File Prior to Visit  Medication Sig Dispense Refill   acetaminophen (TYLENOL) 650 MG CR tablet Take 650 mg by mouth as needed for pain.     acetaZOLAMIDE (DIAMOX) 125 MG tablet Take 125 mg by  mouth 3 (three) times daily.     albuterol (VENTOLIN HFA) 108 (90 Base) MCG/ACT inhaler Inhale 1-2 puffs into the lungs every 6 (six) hours as needed. 8 g 2   EPINEPHrine 0.3 mg/0.3 mL IJ SOAJ injection Inject 0.3 mg into the muscle as needed for anaphylaxis. 1 each 0   levothyroxine (SYNTHROID) 150 MCG tablet Take 150 mcg by mouth daily before breakfast.     loratadine (CLARITIN) 10 MG tablet Take 10 mg by mouth 3 (three) times a week.     meloxicam (MOBIC) 7.5 MG tablet Take 7.5 mg by mouth daily.     predniSONE (STERAPRED UNI-PAK 21 TAB) 10 MG (21) TBPK tablet Take following package directions 21 tablet 0   tiZANidine (ZANAFLEX) 4 MG tablet Take 4 mg by mouth 2 (two) times daily.     triamcinolone cream (KENALOG) 0.1 % Apply 1 Application topically 2 (two) times daily. 80 g 3   triamterene-hydrochlorothiazide (MAXZIDE) 75-50 MG tablet Take 1 tablet by mouth daily.     No current facility-administered medications on file prior to visit.    ALLERGIES: Allergies  Allergen Reactions   Decadron [Dexamethasone] Other (See Comments)  Shaky    Prilosec [Omeprazole]     Chest pains   Simvastatin Other (See Comments)    Weakness, mind fog, and back pain    Vitamin D Analogs Other (See Comments)    Constipation with OTC products, Per records from [facility]    FAMILY HISTORY: Family History  Problem Relation Age of Onset   Hypotension Mother    High blood pressure Mother        Per [facility] New Patient Packet   High Cholesterol Mother        Per [facility] New Patient Packet   Arthritis Mother        Per [facility] New Patient Packet   High blood pressure Brother        Per [facility] New Patient Packet   High Cholesterol Brother        Per [facility] New Patient Packet   Cancer Maternal Aunt        Per [facility] New Patient Packet   Diabetes Maternal Aunt    Diabetes Maternal Uncle        Per [facility] New Patient Packet   Breast cancer Maternal Grandmother    Diabetes Maternal Grandmother    Stroke Maternal  Grandmother        Per [facility] New Patient Packet   Arthritis Maternal Grandmother        Per [facility] New Patient Packet   High blood pressure Son        Per [facility] New Patient Packet   High Cholesterol Son        Per [facility] New Patient Packet   Thyroid disease Son        Per [facility] New Patient Packet   Mood Disorder Son        Per [facility] New Patient Packet   Autism Son        Per [facility] New Patient Packet   Migraines Neg Hx     Objective:  *** General: No acute distress.  Patient appears well-groomed.   Head:  Normocephalic/atraumatic Eyes:  fundi examined but not visualized Neck: supple, no paraspinal tenderness, full range of motion Back: No paraspinal tenderness Heart: regular rate and rhythm Lungs: Clear to auscultation bilaterally. Vascular: No carotid bruits. Neurological Exam: Mental status: alert and oriented to person, place, and time, speech fluent and not dysarthric, language intact. Cranial nerves: CN I: not tested CN II: pupils equal, round and reactive to light, visual fields intact CN III, IV, VI:  full range of motion, no nystagmus, no ptosis CN V: facial sensation intact. CN VII: upper and lower face symmetric CN VIII: hearing intact CN IX, X: gag intact, uvula midline CN XI: sternocleidomastoid and trapezius muscles intact CN XII: tongue midline Bulk & Tone: normal, no fasciculations. Motor:  muscle strength 5/5 throughout Sensation:  Pinprick, temperature and vibratory sensation intact. Deep Tendon Reflexes:  2+ throughout,  toes downgoing.   Finger to nose testing:  Without dysmetria.   Heel to shin:  Without dysmetria.   Gait:  Normal station and stride.  Romberg negative.    Thank you for allowing me to take part in the care of this patient.  Shon Millet, DO  CC: Abbey Chatters, NP

## 2023-03-20 ENCOUNTER — Encounter: Payer: Self-pay | Admitting: Neurology

## 2023-03-20 ENCOUNTER — Ambulatory Visit (INDEPENDENT_AMBULATORY_CARE_PROVIDER_SITE_OTHER): Payer: Medicaid Other | Admitting: Neurology

## 2023-03-20 VITALS — BP 110/73 | HR 59 | Ht 67.0 in | Wt 196.0 lb

## 2023-03-20 DIAGNOSIS — G4486 Cervicogenic headache: Secondary | ICD-10-CM | POA: Diagnosis not present

## 2023-03-20 NOTE — Progress Notes (Signed)
NEUROLOGY CONSULTATION NOTE  KEOSHIA Teresa Kent MRN: [id number] DOB: 19-Jan-1966  Referring provider: Abbey Chatters, NP Primary care provider: Abbey Chatters, NP  Reason for consult:  headache  Assessment/Plan:   Headache - difficult to determine etiology.  Based on MRI findings and intermittent blurred vision (with one episode of visual obscurations), IIH is possible.  However, opening pressure off Diamox still fell within normal range with normal eye exam.  Regardless, she is already on a diuretic for her blood pressure.  While not directly related to a specific upper cervical spine degeneration or stenosis, pain and spasms from the cervical spine may be triggering the headache.  PT and home neck exercises may be reason headaches have improved.  I do not suspect temporal arteritis as sed rate wasn't significantly elevated and the headache wasn't unilateral/temporal.     As headaches are mild and infrequent, monitor for now.  If she starts having increased headaches are worsening blurred vision, then I recommend immediate eye exam and consider starting a medication (topiramate which may address IIH as well, or nortriptyline which will only address headache).  Advised to continue home neck stretching exercises.  Otherwise, follow up as needed.    Total time spent in chart, reviewing notes, reviewing imaging and face to face with patient:  90 minutes.   Subjective:  Teresa Kent is a 57 year old right-handed female with hypertension and hypothyroidism who presents for migraines and idiopathic intracranial hypertension.  History supplemented by prior neurologist's and referring provider's notes.  In 2023, she started experiencing head pressure as well as left sided suboccipital headache.  No associated nausea.  It was constant.  She would also have intermittent blurred vision.  One time, she had black out of vision for 2-3 minutes.  She also noted photophobia.  She also experienced  right pulsatile tinnitus.  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  She reportedly had an eye exam that showed questionable optic disc edema.  She was started on Diamox.  Underwent LP on 04/25/2022 which revealed normal opening pressure of 10 cm water.  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  Ophthalmology exam on 05/03/2022 showed no evidence of papilledema.  OCT RNFL and OPTOS photography of the optic disc on 06/02/2022 was negative also negative for disc edema.  Reported experiencing hives with Diamox.  Plan was to switch to topiramate but she declined due to the potential side effects, so she continued Diamox on lower dose of 125mg  daily to twice daily.  She was seen in the headache center at [facility] in January 2024 who believed her headaches were cervicogenic and not due to IIH.  Recommended pain management for facet injections and median branch block and to discontinue Diamox.  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   She was followed by Dr. Jake Samples at [facility] for her neck pain, treated with dry needling and PT.  She continues to perform home neck stretching exercises.  She had been exhibiting elevated sed rate and CRP with sed rate in 2024 ranging from 37  to 44 to 36 and CRP from 2.2 to 3.9.  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  She declined a temporal artery biopsy.  Diamox was discontinued for several weeks and she had a repeat LP on 06/28/2022 which this time  revealed opening pressure of 19 cm water.  She saw neurosurgery at [facility] for possible stent.  Arterial angiogram did not demonstrate any vascular abnormalities and venous angiogram did not demonstrate any significant pressure gradient but did demonstrate stenosis at the right transverse/sigmoid junction.  He explained that a stent would eliminate the pulsatile tinnitus but not significantly affect her intracranial pressure and recommended VP shunt.  Since May 2024, headaches have improved.  They are now a 4/10 pressure on top of her head with no associated symptoms.  They may last all day but occur 2 to 3 times a month.  Denies blurred vision.  More recent eye exams have been negative for optic disc edema.     PAST MEDICAL HISTORY: Past Medical History:  Diagnosis Date   Abnormal Pap smear 02/21/2003   Acne    Per records from [facility]   Acute sinusitis    Per Records from [facility]   Anemia    heavy menses   Arthritis    Per [facility] New Patient Packet   Cervicogenic headache    Per [facility] New Patient Packet   Colon cancer screening 03/10/2022   Per Records from [facility]   COVID    Per Records from [facility]   Elevated sed rate    Per records from [facility]   Elevated sed rate    Per Records from [facility]   Endometriosis 02/21/2004   Fever blister    Per records from [facility]   Fibroids, submucosal 02/21/2004   GERD (gastroesophageal reflux disease)    Per Records from [facility]   Goiter    Per records from [facility]   H/O: menorrhagia 02/21/2004   High cholesterol    Per [facility] New Patient Packet   Hx of mammogram 2024   Per [facility] New Patient Packet   Hypertension    Hypothyroidism    Idiopathic intracranial hypertension    Per Records from [facility]   Increased BMI 10/11/2010   Lower extremity edema    Per Records from [facility]   Morbid obesity [facility])    Per records from [facility]   Ovarian  cyst, right 03/19/2004   Papanicolaou smear 09/2021   Per Records from [facility]   Postprocedural hypothyroidism    Per Records from [facility]   Simple endometrial hyperplasia 02/21/2004   Vitamin D deficiency    Per records from [facility]    PAST SURGICAL HISTORY: Past Surgical History:  Procedure Laterality Date   DILATION AND CURETTAGE OF UTERUS  03/19/2004   DILITATION & CURRETTAGE/HYSTROSCOPY WITH NOVASURE ABLATION N/A 12/20/2012   Procedure: DILATATION & CURETTAGE/HYSTEROSCOPY WITH NOVASURE ABLATION;  Surgeon: Purcell Nails, MD;  Location: [facility];  Service: Gynecology;  Laterality: N/A;   OVARY SURGERY     resection of fibroid  03/19/2004   THYROID SURGERY     TUBAL LIGATION      MEDICATIONS: Current Outpatient Medications on File Prior to Visit  Medication Sig Dispense Refill   acetaminophen (TYLENOL) 650 MG CR tablet Take 650 mg by mouth as needed for pain.     acetaZOLAMIDE (DIAMOX) 125 MG tablet Take 125 mg by mouth 3 (three) times daily.  albuterol (VENTOLIN HFA) 108 (90 Base) MCG/ACT inhaler Inhale 1-2 puffs into the lungs every 6 (six) hours as needed. 8 g 2   EPINEPHrine 0.3 mg/0.3 mL IJ SOAJ injection Inject 0.3 mg into the muscle as needed for anaphylaxis. 1 each 0   levothyroxine (SYNTHROID) 150 MCG tablet Take 150 mcg by mouth daily before breakfast.     loratadine (CLARITIN) 10 MG tablet Take 10 mg by mouth 3 (three) times a week.     meloxicam (MOBIC) 7.5 MG tablet Take 7.5 mg by mouth daily.     predniSONE (STERAPRED UNI-PAK 21 TAB) 10 MG (21) TBPK tablet Take following package directions 21 tablet 0   tiZANidine (ZANAFLEX) 4 MG tablet Take 4 mg by mouth 2 (two) times daily.     triamcinolone cream (KENALOG) 0.1 % Apply 1 Application topically 2 (two) times daily. 80 g 3   triamterene-hydrochlorothiazide (MAXZIDE) 75-50 MG tablet Take 1 tablet by mouth daily.     No current facility-administered medications on file prior to visit.     ALLERGIES: Allergies  Allergen Reactions   Decadron [Dexamethasone] Other (See Comments)    Shaky    Prilosec [Omeprazole]     Chest pains   Simvastatin Other (See Comments)    Weakness, mind fog, and back pain    Vitamin D Analogs Other (See Comments)    Constipation with OTC products, Per records from [facility]    FAMILY HISTORY: Family History  Problem Relation Age of Onset   Hypotension Mother    High blood pressure Mother        Per [facility] New Patient Packet   High Cholesterol Mother        Per [facility] New Patient Packet   Arthritis Mother        Per [facility] New Patient Packet   High blood pressure Brother        Per [facility] New Patient Packet   High Cholesterol Brother        Per [facility] New Patient Packet   Cancer Maternal Aunt        Per [facility] New Patient Packet   Diabetes Maternal Aunt    Diabetes Maternal Uncle        Per [facility] New Patient Packet   Breast cancer Maternal Grandmother    Diabetes Maternal Grandmother    Stroke Maternal Grandmother        Per [facility] New Patient Packet   Arthritis Maternal Grandmother        Per [facility] New Patient Packet   High blood pressure Son        Per [facility] New Patient Packet   High Cholesterol Son        Per [facility] New Patient Packet   Thyroid disease Son        Per [facility] New Patient Packet   Mood Disorder Son        Per [facility] New Patient Packet   Autism Son        Per [facility] New Patient Packet   Migraines Neg Hx     Objective:  Blood pressure 110/73, pulse (!) 59, height 5\' 7"  (1.702 m), weight 196 lb (88.9 kg), last menstrual period 10/31/2019, SpO2 100%. General: No acute distress.  Patient appears well-groomed.   Head:  Normocephalic/atraumatic, mild left suboccipital tenderness Eyes:  fundi examined but not visualized Neck: supple, no paraspinal tenderness, full range of motion Back: No paraspinal tenderness Heart: regular rate and rhythm Lungs: Clear to auscultation bilaterally.  Vascular: No carotid bruits. Neurological  Exam: Mental status: alert and oriented to person, place, and time, speech fluent and not dysarthric, language intact. Cranial nerves: CN I: not tested CN II: pupils equal, round and reactive to light, visual fields intact CN III, IV, VI:  full range of motion, no nystagmus, no ptosis CN V: facial sensation intact. CN VII: upper and lower face symmetric CN VIII: hearing intact CN IX, X: gag intact, uvula midline CN XI: sternocleidomastoid and trapezius muscles intact CN XII: tongue midline Bulk & Tone: normal, no fasciculations. Motor:  muscle strength 5/5 throughout Sensation:  Pinprick and vibratory sensation intact. Deep Tendon Reflexes:  2+ throughout,  toes downgoing.   Finger to nose testing:  Without dysmetria.     Gait:  Normal station and stride.  Romberg negative.    Thank you for allowing me to take part in the care of this patient.  Shon Millet, DO  CC: Abbey Chatters, NP

## 2023-03-20 NOTE — Patient Instructions (Signed)
At this point, I  am suspecting headaches are due to the neck.  If headaches should get worse again, first get an eye exam and have them send note to me.  Then we would need to consider starting the TOPAMAX (TOPIRAMATE) which treats headaches AND IIH or an antidepressant to treat headache alone  Keep with neck exercises.

## 2023-03-31 ENCOUNTER — Ambulatory Visit: Payer: Medicaid Other | Admitting: Nurse Practitioner

## 2023-04-04 LAB — FECAL OCCULT BLOOD, GUAIAC: Fecal Occult Blood: NEGATIVE

## 2023-04-24 ENCOUNTER — Ambulatory Visit: Payer: Medicaid Other | Admitting: Nurse Practitioner

## 2023-05-04 ENCOUNTER — Ambulatory Visit: Payer: Medicaid Other | Admitting: Neurology

## 2023-05-15 ENCOUNTER — Ambulatory Visit (INDEPENDENT_AMBULATORY_CARE_PROVIDER_SITE_OTHER): Payer: Self-pay | Admitting: Nurse Practitioner

## 2023-05-15 VITALS — BP 118/78 | HR 54 | Temp 97.8°F | Ht 71.0 in | Wt 205.0 lb

## 2023-05-15 DIAGNOSIS — I10 Essential (primary) hypertension: Secondary | ICD-10-CM

## 2023-05-15 DIAGNOSIS — Z114 Encounter for screening for human immunodeficiency virus [HIV]: Secondary | ICD-10-CM

## 2023-05-15 DIAGNOSIS — Z1159 Encounter for screening for other viral diseases: Secondary | ICD-10-CM

## 2023-05-15 DIAGNOSIS — E782 Mixed hyperlipidemia: Secondary | ICD-10-CM

## 2023-05-15 DIAGNOSIS — G8929 Other chronic pain: Secondary | ICD-10-CM

## 2023-05-15 DIAGNOSIS — K219 Gastro-esophageal reflux disease without esophagitis: Secondary | ICD-10-CM

## 2023-05-15 DIAGNOSIS — E890 Postprocedural hypothyroidism: Secondary | ICD-10-CM

## 2023-05-15 DIAGNOSIS — G932 Benign intracranial hypertension: Secondary | ICD-10-CM

## 2023-05-15 DIAGNOSIS — M4802 Spinal stenosis, cervical region: Secondary | ICD-10-CM

## 2023-05-15 DIAGNOSIS — M545 Low back pain, unspecified: Secondary | ICD-10-CM

## 2023-05-15 NOTE — Progress Notes (Signed)
Careteam: Patient Care Team: Sharon Seller, NP as PCP - General (Geriatric Medicine) Thomasene Ripple, DO as PCP - Cardiology (Cardiology) Kathi Der, MD as Consulting Physician (Gastroenterology) Osborn Coho, MD as Consulting Physician (Obstetrics and Gynecology) Thomasene Ripple, DO as Consulting Physician (Cardiology) Anson Fret, MD as Consulting Physician (Neurology) Lupita Leash, MD as Consulting Physician (Pulmonary Disease) Drema Dallas, DO as Consulting Physician (Neurology)  PLACE OF SERVICE:  [facility]  Advanced Directive information Does Patient Have a Medical Advance Directive?: Yes, Type of Advance Directive: Healthcare Power of [place];Living will, Does patient want to make changes to medical advance directive?: No - Patient declined  Allergies  Allergen Reactions   Decadron [Dexamethasone] Other (See Comments)    Shaky    Prilosec [Omeprazole]     Chest pains   Simvastatin Other (See Comments)    Weakness, mind fog, and back pain    Vitamin D Analogs Other (See Comments)    Constipation with OTC products, Per records from [facility]    Chief Complaint  Patient presents with   Medical Management of Chronic Issues    3 month follow-up. Discuss need for cervical cancer screening, td/tdap, coivd booster, hiv screening, hep c screening, and colonoscopy.      HPI: Patient is a 57 y.o. female for routine follow up.   Saw rheumatologist last month.   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 Not on any medication for headaches at this time.  She is doing well. No concerns  GERD- well controlled- not requiring any medication  Hypothyroid- continues on synthroid 150 mcg  Hypertension- well controlled.  She is eating a lot of vegetables and low sodium   Neurologist felt like headaches were coming from her neck and the arthritis.  Overall controlled even though back and neck does bother her  at times.   Hyperlipidemia- she does not wish to take medication because she feels like she feels worse on medication.    Review of Systems:  Review of Systems  Constitutional:  Negative for chills, fever and weight loss.  HENT:  Negative for tinnitus.   Respiratory:  Negative for cough, sputum production and shortness of breath.   Cardiovascular:  Negative for chest pain, palpitations and leg swelling.  Gastrointestinal:  Negative for abdominal pain, constipation, diarrhea and heartburn.  Genitourinary:  Negative for dysuria, frequency and urgency.  Musculoskeletal:  Positive for back pain, joint pain and neck pain. Negative for falls and myalgias.  Skin: Negative.   Neurological:  Negative for dizziness and headaches.  Psychiatric/Behavioral:  Negative for depression and memory loss. The patient does not have insomnia.     Past Medical History:  Diagnosis Date   Abnormal Pap smear 02/21/2003   Acne    Per records from [facility]   Acute sinusitis    Per Records from [facility]   Anemia    heavy menses   Arthritis    Per [facility] New Patient Packet   Cervicogenic headache    Per [facility] New Patient Packet   Colon cancer screening 03/10/2022   Per Records from [facility]   COVID    Per Records from [facility]   Elevated sed rate    Per records from [facility]   Elevated sed rate    Per Records from [facility]   Endometriosis 02/21/2004   Fever blister    Per records from [facility]   Fibroids, submucosal 02/21/2004  GERD (gastroesophageal reflux disease)    Per [facility] from [facility]   Goiter    Per records from [facility]   H/O: menorrhagia 02/21/2004   High cholesterol    Per [facility] New Patient Packet   Hx of mammogram 2024   Per [facility] New Patient Packet   Hypertension    Hypothyroidism    Idiopathic intracranial hypertension    Per Records from [facility]   Increased BMI 10/11/2010   Lower extremity edema     Per Records from [facility]   Morbid obesity [facility])    Per records from [facility]   Ovarian cyst, right 03/19/2004   Papanicolaou smear 09/2021   Per Records from [facility]   Postprocedural hypothyroidism    Per Records from [facility]   Simple endometrial hyperplasia 02/21/2004   Vitamin D deficiency    Per records from [facility]   Past Surgical History:  Procedure Laterality Date   DILATION AND CURETTAGE OF UTERUS  03/19/2004   DILITATION & CURRETTAGE/HYSTROSCOPY WITH NOVASURE ABLATION N/A 12/20/2012   Procedure: DILATATION & CURETTAGE/HYSTEROSCOPY WITH NOVASURE ABLATION;  Surgeon: Purcell Nails, MD;  Location: [facility];  Service: Gynecology;  Laterality: N/A;   OVARY SURGERY     resection of fibroid  03/19/2004   THYROID SURGERY     TUBAL LIGATION     Social History:   reports that she has never smoked. She has never been exposed to tobacco smoke. She has never used smokeless tobacco. She reports that she does not drink alcohol and does not use drugs.  Family History  Problem Relation Age of Onset   Hypotension Mother    High blood pressure Mother        Per [facility] New Patient Packet   High Cholesterol Mother        Per [facility] New Patient Packet   Arthritis Mother        Per [facility] New Patient Packet   High blood pressure Brother        Per [facility] New Patient Packet   High Cholesterol Brother        Per [facility] New Patient Packet   Cancer Maternal Aunt        Per [facility] New Patient Packet   Diabetes Maternal Aunt    Diabetes Maternal Uncle        Per [facility] New Patient Packet   Breast cancer Maternal Grandmother    Diabetes Maternal Grandmother    Stroke Maternal Grandmother        Per [facility] New Patient Packet   Arthritis Maternal Grandmother        Per [facility] New Patient Packet   High blood pressure Son        Per [facility] New Patient Packet   High Cholesterol Son        Per [facility] New Patient Packet   Thyroid disease Son        Per [facility] New Patient Packet    Mood Disorder Son        Per [facility] New Patient Packet   Autism Son        Per [facility] New Patient Packet   Migraines Neg Hx     Medications: [name] Medications  New Prescriptions   No medications on file  Previous Medications   ACETAMINOPHEN (TYLENOL) 650 MG CR TABLET    Take 650 mg by mouth as needed for pain.   ALBUTEROL (VENTOLIN HFA) 108 (90 BASE) MCG/ACT INHALER  Inhale 1-2 puffs into the lungs every 6 (six) hours as needed.   EPINEPHRINE 0.3 MG/0.3 ML IJ SOAJ INJECTION    Inject 0.3 mg into the muscle as needed for anaphylaxis.   LEVOTHYROXINE (SYNTHROID) 150 MCG TABLET    Take 150 mcg by mouth daily before breakfast.   LORATADINE (CLARITIN) 10 MG TABLET    Take 10 mg by mouth 3 (three) times a week.   MELOXICAM (MOBIC) 7.5 MG TABLET    Take 7.5 mg by mouth as needed.   TIZANIDINE (ZANAFLEX) 4 MG TABLET    Take 4 mg by mouth as needed.   TRIAMCINOLONE CREAM (KENALOG) 0.1 %    Apply 1 Application topically 2 (two) times daily.   TRIAMTERENE-HYDROCHLOROTHIAZIDE (MAXZIDE) 75-50 MG TABLET    Take 1 tablet by mouth daily.  Modified Medications   No medications on file  Discontinued Medications   No medications on file    Physical Exam:  Vitals:   05/15/23 0834  BP: 118/78  Pulse: (!) 54  Temp: 97.8 F (36.6 C)  TempSrc: Temporal  SpO2: 98%  Weight: 205 lb (93 kg)  Height: 5\' 11"  (1.803 m)   Body mass index is 28.59 kg/m. Wt Readings from Last 3 Encounters:  05/15/23 205 lb (93 kg)  03/20/23 196 lb (88.9 kg)  03/14/23 194 lb 6.4 oz (88.2 kg)    Physical Exam Constitutional:      General: She is not in acute distress.    Appearance: She is well-developed. She is not diaphoretic.  HENT:     Head: Normocephalic and atraumatic.     Mouth/Throat:     Pharynx: No oropharyngeal exudate.  Eyes:     Conjunctiva/sclera: Conjunctivae normal.     Pupils: Pupils are equal, round, and reactive to light.  Cardiovascular:     Rate and Rhythm: Normal rate and regular rhythm.      Heart sounds: Normal heart sounds.  Pulmonary:     Effort: Pulmonary effort is normal.     Breath sounds: Normal breath sounds.  Abdominal:     General: Bowel sounds are normal.     Palpations: Abdomen is soft.  Musculoskeletal:     Cervical back: Normal range of motion and neck supple.     Right lower leg: No edema.     Left lower leg: No edema.  Skin:    General: Skin is warm and dry.  Neurological:     Mental Status: She is alert.  Psychiatric:        Mood and Affect: Mood normal.     Labs reviewed: Basic Metabolic Panel: Recent Labs    06/10/22 0000 07/27/22 0000 08/12/22 0912 10/06/22 1354 11/02/22 0834  NA 142   < > 140 139 144  K 3.9   < > 3.6 3.5 3.8  CL 104   < > 99 91* 106  CO2 32*   < > 32 33* 25  GLUCOSE  --   --  88 90 83  BUN 6   < > 8 7 9   CREATININE 0.7   < > 0.67 0.76 0.85  CALCIUM 9.5   < > 9.6 10.9* 9.2  TSH 2.63  --  0.89  --   --    < > = values in this interval not displayed.   Liver Function Tests: Recent Labs    06/10/22 0000 07/27/22 0000 08/12/22 0912 10/06/22 1354 11/02/22 0834  AST 17  --  17 18 13   ALT  18  --  12 10 13   ALKPHOS 56  --   --  67 65  BILITOT  --   --  0.7 1.0 1.0  PROT  --   --  7.3 8.7* 6.8  ALBUMIN 3.8 3.9  3.9  --  4.8 3.9   No results for input(s): "LIPASE", "AMYLASE" in the last 8760 hours. No results for input(s): "AMMONIA" in the last 8760 hours. CBC: Recent Labs    08/12/22 0912 10/06/22 1354 11/02/22 0834  WBC 5.0 4.4 4.5  NEUTROABS 2,390 1.7 1.9  HGB 12.6 14.2 13.1  HCT 39.4 44.3 41.3  MCV 90.4 89.7 89  PLT 384 303 309   Lipid Panel: Recent Labs    06/10/22 0000 08/12/22 0912  CHOL 221* 243*  HDL 46 71  LDLCALC 157 152*  TRIG 103 90  CHOLHDL  --  3.4   TSH: Recent Labs    06/10/22 0000 08/12/22 0912  TSH 2.63 0.89   A1C: No results found for: "HGBA1C"   Assessment/Plan 1. Mixed hyperlipidemia (Primary) -discussed she feels like she is able to make dietary changes vs  medication - Lipid panel - AMB Referral to [facility]  2. Cervical stenosis of spinal canal -ongoing but stable at this time  3. Chronic bilateral low back pain without sciatica Ongoing but stable. Continue lifestyle modifications.   4. Intracranial hypertension Followed by neurology, no ongoing headaches at this time  5. Gastroesophageal reflux disease without esophagitis Diet controlled. Continue dietary modifications  6. Primary hypertension -Blood pressure well controlled, goal bp <140/90 Continue current medications and dietary modifications follow metabolic panel - CBC with Differential/Platelet - Complete Metabolic Panel with eGFR  7. Postoperative hypothyroidism -continues on synthroid - TSH  8. Need for hepatitis C screening test - Hepatitis C antibody  9. Encounter for screening for HIV - HIV Antibody (routine testing w rflx)   Return in about 6 months (around 11/13/2023) for routine follow up .  Teresa Kent. Biagio Borg [facility] [phone]

## 2023-05-16 LAB — COMPLETE METABOLIC PANEL WITH GFR
AG Ratio: 1.3 (calc) (ref 1.0–2.5)
ALT: 8 U/L (ref 6–29)
AST: 14 U/L (ref 10–35)
Albumin: 4 g/dL (ref 3.6–5.1)
Alkaline phosphatase (APISO): 64 U/L (ref 37–153)
BUN: 13 mg/dL (ref 7–25)
CO2: 31 mmol/L (ref 20–32)
Calcium: 9.5 mg/dL (ref 8.6–10.4)
Chloride: 102 mmol/L (ref 98–110)
Creat: 0.74 mg/dL (ref 0.50–1.03)
Globulin: 3 g/dL (ref 1.9–3.7)
Glucose, Bld: 79 mg/dL (ref 65–99)
Potassium: 3.8 mmol/L (ref 3.5–5.3)
Sodium: 140 mmol/L (ref 135–146)
Total Bilirubin: 1 mg/dL (ref 0.2–1.2)
Total Protein: 7 g/dL (ref 6.1–8.1)
eGFR: 95 mL/min/{1.73_m2} (ref >=60)

## 2023-05-16 LAB — CBC WITH DIFFERENTIAL/PLATELET
Absolute Lymphocytes: 1656 {cells}/uL (ref 850–3900)
Absolute Monocytes: 331 {cells}/uL (ref 200–950)
Basophils Absolute: 50 {cells}/uL (ref 0–200)
Basophils Relative: 1.4 %
Eosinophils Absolute: 392 {cells}/uL (ref 15–500)
Eosinophils Relative: 10.9 %
HCT: 40.1 % (ref 35.0–45.0)
Hemoglobin: 12.9 g/dL (ref 11.7–15.5)
MCH: 29.6 pg (ref 27.0–33.0)
MCHC: 32.2 g/dL (ref 32.0–36.0)
MCV: 92 fL (ref 80.0–100.0)
MPV: 10 fL (ref 7.5–12.5)
Monocytes Relative: 9.2 %
Neutro Abs: 1170 {cells}/uL — ABNORMAL LOW (ref 1500–7800)
Neutrophils Relative %: 32.5 %
Platelets: 277 10*3/uL (ref 140–400)
RBC: 4.36 10*6/uL (ref 3.80–5.10)
RDW: 11 % (ref 11.0–15.0)
Total Lymphocyte: 46 %
WBC: 3.6 10*3/uL — ABNORMAL LOW (ref 3.8–10.8)

## 2023-05-16 LAB — HIV ANTIBODY (ROUTINE TESTING W REFLEX): HIV 1&2 Ab, 4th Generation: NONREACTIVE

## 2023-05-16 LAB — HEPATITIS C ANTIBODY: Hepatitis C Ab: NONREACTIVE

## 2023-05-16 LAB — LIPID PANEL
Cholesterol: 223 mg/dL — ABNORMAL HIGH (ref <200)
HDL: 73 mg/dL (ref >=50)
LDL Cholesterol (Calc): 135 mg/dL — ABNORMAL HIGH
Non-HDL Cholesterol (Calc): 150 mg/dL — ABNORMAL HIGH (ref <130)
Total CHOL/HDL Ratio: 3.1 (calc) (ref <5.0)
Triglycerides: 62 mg/dL (ref <150)

## 2023-05-16 LAB — TSH: TSH: 0.79 m[IU]/L

## 2023-05-31 ENCOUNTER — Encounter: Payer: Self-pay | Admitting: *Deleted

## 2023-07-17 ENCOUNTER — Other Ambulatory Visit: Payer: Self-pay | Admitting: Obstetrics and Gynecology

## 2023-07-17 DIAGNOSIS — Z1231 Encounter for screening mammogram for malignant neoplasm of breast: Secondary | ICD-10-CM

## 2023-08-09 ENCOUNTER — Ambulatory Visit
Admission: RE | Admit: 2023-08-09 | Discharge: 2023-08-09 | Disposition: A | Discharge status: Home / Self Care | Payer: Medicaid Other | Source: Ambulatory Visit | Attending: Obstetrics and Gynecology | Admitting: Obstetrics and Gynecology

## 2023-08-09 DIAGNOSIS — Z1231 Encounter for screening mammogram for malignant neoplasm of breast: Secondary | ICD-10-CM

## 2023-09-01 NOTE — Progress Notes (Deleted)
 NEUROLOGY FOLLOW UP OFFICE NOTE  Teresa Kent [id number]  Assessment/Plan:   Headache - I do not suspect idiopathic intracranial hypertension in setting or normal opening CSF pressure off Diamox and normal dilated eye exam.  I do not suspect temporal arteritis.     ***   Subjective:  Teresa Kent is a 58 year old right-handed female with hypertension and hypothyroidism who follows up for headaches.  In 2023, she started experiencing head pressure as well as left sided suboccipital headache.  No associated nausea.  It was constant.  She would also have intermittent blurred vision.  One time, she had black out of vision for 2-3 minutes.  She also noted photophobia.  She also experienced right pulsatile tinnitus.  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  She reportedly had an eye exam that showed questionable optic disc edema.  She was started on Diamox.  Underwent LP on 04/25/2022 which revealed normal opening pressure of 10 cm water.  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  Ophthalmology exam on 05/03/2022 showed no evidence of papilledema.  OCT RNFL and OPTOS photography of the optic disc on 06/02/2022 was negative also negative for disc edema.  Reported experiencing hives with Diamox.  Plan was to switch to topiramate but she declined due to the potential side effects, so she continued Diamox on lower dose of 125mg  daily to twice daily.  She was seen in the headache center at [facility] in January 2024 who believed her headaches were cervicogenic and not due to IIH.  Recommended pain management for facet injections and median branch block and to discontinue Diamox.  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   She was followed by Dr. Jake Samples at [facility] for her neck pain, treated with dry needling and PT.  She continues to perform home neck stretching exercises.  She had been exhibiting elevated sed rate and CRP with sed rate in 2024 ranging from 37 to 44 to 36 and CRP from 2.2 to 3.9.  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  She declined a temporal artery biopsy.  Diamox was discontinued for several weeks and she had a repeat LP on 06/28/2022 which this time revealed opening pressure of 19 cm water.  She saw neurosurgery at [facility] for possible stent.  Arterial angiogram did not demonstrate any vascular abnormalities and venous angiogram did not demonstrate any significant pressure gradient but did demonstrate stenosis at the right transverse/sigmoid junction.  He explained that a stent would eliminate the pulsatile tinnitus but not significantly affect her intracranial pressure and recommended VP shunt.  Since May 2024, headaches have improved.  They are now a 4/10 pressure on top of her head with no associated symptoms.  They may last all day but occur 2 to 3 times a month.  Denies blurred vision.  More recent eye exams have been negative for optic disc edema.    PAST MEDICAL HISTORY: Past Medical History:  Diagnosis Date   Abnormal Pap smear 02/21/2003   Acne    Per records from  [facility]   Acute sinusitis    Per Records from [facility]   Anemia    heavy menses   Arthritis    Per [facility] New Patient Packet   Cervicogenic headache    Per [facility] New Patient Packet   Colon cancer screening 03/10/2022   Per Records from [facility]   COVID    Per Records from [facility]   Elevated sed rate    Per records from [facility]   Elevated sed rate    Per Records from [facility]   Endometriosis 02/21/2004   Fever blister    Per records from [facility]   Fibroids, submucosal 02/21/2004   GERD (gastroesophageal reflux disease)    Per Records from [facility]   Goiter    Per records from [facility]   H/O: menorrhagia 02/21/2004   High cholesterol    Per [facility] New Patient Packet   Hx of mammogram 2024   Per [facility] New Patient Packet   Hypertension    Hypothyroidism    Idiopathic intracranial hypertension    Per Records from [facility]   Increased BMI 10/11/2010   Lower extremity edema    Per Records from [facility]   Morbid obesity [facility])    Per records from [facility]   Ovarian cyst, right 03/19/2004   Papanicolaou smear 09/2021   Per Records from [facility]   Postprocedural hypothyroidism    Per Records from [facility]   Simple endometrial hyperplasia 02/21/2004   Vitamin D deficiency    Per records from [facility]    MEDICATIONS: Current Outpatient Medications on File Prior to Visit  Medication Sig Dispense Refill   acetaminophen (TYLENOL) 650 MG CR tablet Take 650 mg by mouth as needed for pain.     albuterol (VENTOLIN HFA) 108 (90 Base) MCG/ACT inhaler Inhale 1-2 puffs into the lungs every 6 (six) hours as needed. 8 g 2   EPINEPHrine 0.3 mg/0.3 mL IJ SOAJ injection Inject 0.3 mg into the muscle as needed for anaphylaxis. 1 each 0   levothyroxine (SYNTHROID) 150 MCG tablet Take 150 mcg by mouth daily before breakfast.     loratadine (CLARITIN) 10 MG tablet Take 10 mg by mouth 3 (three) times a week.     meloxicam (MOBIC) 7.5 MG tablet Take 7.5 mg by mouth as needed.     tiZANidine (ZANAFLEX) 4 MG tablet Take 4 mg by mouth as needed.     triamcinolone cream (KENALOG) 0.1 % Apply 1 Application topically 2 (two) times daily. 80 g 3   triamterene-hydrochlorothiazide (MAXZIDE) 75-50 MG tablet Take 1 tablet by mouth daily.     No  current facility-administered medications on file prior to visit.    ALLERGIES: Allergies  Allergen Reactions   Decadron [Dexamethasone] Other (See Comments)    Shaky    Prilosec [Omeprazole]     Chest pains   Simvastatin Other (See Comments)    Weakness, mind fog, and back pain    Vitamin D Analogs Other (See Comments)    Constipation with OTC products, Per records from [facility]    FAMILY HISTORY: Family History  Problem Relation Age of Onset   Hypotension Mother    High blood pressure Mother        Per [facility] New Patient Packet   High Cholesterol Mother        Per [facility] New Patient Packet   Arthritis Mother        Per  [facility] New Patient Packet   High blood pressure Brother        Per [facility] New Patient Packet   High Cholesterol Brother        Per [facility] New Patient Packet   Cancer Maternal Aunt        Per [facility] New Patient Packet   Diabetes Maternal Aunt    Diabetes Maternal Uncle        Per [facility] New Patient Packet   Breast cancer Maternal Grandmother    Diabetes Maternal Grandmother    Stroke Maternal Grandmother        Per [facility] New Patient Packet   Arthritis Maternal Grandmother        Per [facility] New Patient Packet   High blood pressure Son        Per [facility] New Patient Packet   High Cholesterol Son        Per [facility] New Patient Packet   Thyroid disease Son        Per [facility] New Patient Packet   Mood Disorder Son        Per [facility] New Patient Packet   Autism Son        Per [facility] New Patient Packet   Migraines Neg Hx       Objective:  *** General: No acute distress.  Patient appears ***-groomed.   Head:  Normocephalic/atraumatic Eyes:  Fundi examined but not visualized Neck: supple, no paraspinal tenderness, full range of motion Heart:  Regular rate and rhythm Lungs:  Clear to auscultation bilaterally Back: No paraspinal tenderness Neurological Exam: alert and oriented.  Speech fluent and not dysarthric, language intact.  CN II-XII intact. Bulk and tone normal, muscle  strength 5/5 throughout.  Sensation to light touch intact.  Deep tendon reflexes 2+ throughout, toes downgoing.  Finger to nose testing intact.  Gait normal, Romberg negative.   Shon Millet, DO  CC: ***

## 2023-09-04 ENCOUNTER — Ambulatory Visit: Admitting: Neurology

## 2023-09-12 NOTE — Progress Notes (Unsigned)
 NEUROLOGY FOLLOW UP OFFICE NOTE  Teresa Kent [id number]  Assessment/Plan:   Headache - I do not suspect idiopathic intracranial hypertension in setting or normal opening CSF pressure off Diamox and normal dilated eye exam.  I do not suspect temporal arteritis.     ***   Subjective:  Teresa Kent is a 58 year old right-handed female with hypertension and hypothyroidism who follows up for headaches.  In 2023, she started experiencing head pressure as well as left sided suboccipital headache.  No associated nausea.  It was constant.  She would also have intermittent blurred vision.  One time, she had black out of vision for 2-3 minutes.  She also noted photophobia.  She also experienced right pulsatile tinnitus.  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  She reportedly had an eye exam that showed questionable optic disc edema.  She was started on Diamox.  Underwent LP on 04/25/2022 which revealed normal opening pressure of 10 cm water.  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  Ophthalmology exam on 05/03/2022 showed no evidence of papilledema.  OCT RNFL and OPTOS photography of the optic disc on 06/02/2022 was negative also negative for disc edema.  Reported experiencing hives with Diamox.  Plan was to switch to topiramate but she declined due to the potential side effects, so she continued Diamox on lower dose of 125mg  daily to twice daily.  She was seen in the headache center at [facility] in January 2024 who believed her headaches were cervicogenic and not due to IIH.  Recommended pain management for facet injections and median branch block and to discontinue Diamox.  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   She was followed by Dr. Jake Samples at [facility] for her neck pain, treated with dry needling and PT.  She continues to perform home neck stretching exercises.  She had been exhibiting elevated sed rate and CRP with sed rate in 2024 ranging from 37 to 44 to 36 and CRP from 2.2 to 3.9.  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  She declined a temporal artery biopsy.  Diamox was discontinued for several weeks and she had a repeat LP on 06/28/2022 which this time revealed opening pressure of 19 cm water.  She saw neurosurgery at [facility] for possible stent.  Arterial angiogram did not demonstrate any vascular abnormalities and venous angiogram did not demonstrate any significant pressure gradient but did demonstrate stenosis at the right transverse/sigmoid junction.  He explained that a stent would eliminate the pulsatile tinnitus but not significantly affect her intracranial pressure and recommended VP shunt.  Since May 2024, headaches have improved.  They are now a 4/10 pressure on top of her head with no associated symptoms.  They may last all day but occur 2 to 3 times a month.  Denies blurred vision.  More recent eye exams have been negative for optic disc edema.    PAST MEDICAL HISTORY: Past Medical History:  Diagnosis Date   Abnormal Pap smear 02/21/2003   Acne    Per records from  [facility]   Acute sinusitis    Per Records from [facility]   Anemia    heavy menses   Arthritis    Per [facility] New Patient Packet   Cervicogenic headache    Per [facility] New Patient Packet   Colon cancer screening 03/10/2022   Per Records from [facility]   COVID    Per Records from [facility]   Elevated sed rate    Per records from [facility]   Elevated sed rate    Per Records from [facility]   Endometriosis 02/21/2004   Fever blister    Per records from [facility]   Fibroids, submucosal 02/21/2004   GERD (gastroesophageal reflux disease)    Per Records from [facility]   Goiter    Per records from [facility]   H/O: menorrhagia 02/21/2004   High cholesterol    Per [facility] New Patient Packet   Hx of mammogram 2024   Per [facility] New Patient Packet   Hypertension    Hypothyroidism    Idiopathic intracranial hypertension    Per Records from [facility]   Increased BMI 10/11/2010   Lower extremity edema    Per Records from [facility]   Morbid obesity [facility])    Per records from [facility]   Ovarian cyst, right 03/19/2004   Papanicolaou smear 09/2021   Per Records from [facility]   Postprocedural hypothyroidism    Per Records from [facility]   Simple endometrial hyperplasia 02/21/2004   Vitamin D deficiency    Per records from [facility]    MEDICATIONS: Current Outpatient Medications on File Prior to Visit  Medication Sig Dispense Refill   acetaminophen (TYLENOL) 650 MG CR tablet Take 650 mg by mouth as needed for pain.     albuterol (VENTOLIN HFA) 108 (90 Base) MCG/ACT inhaler Inhale 1-2 puffs into the lungs every 6 (six) hours as needed. 8 g 2   EPINEPHrine 0.3 mg/0.3 mL IJ SOAJ injection Inject 0.3 mg into the muscle as needed for anaphylaxis. 1 each 0   levothyroxine (SYNTHROID) 150 MCG tablet Take 150 mcg by mouth daily before breakfast.     loratadine (CLARITIN) 10 MG tablet Take 10 mg by mouth 3 (three) times a week.     meloxicam (MOBIC) 7.5 MG tablet Take 7.5 mg by mouth as needed.     tiZANidine (ZANAFLEX) 4 MG tablet Take 4 mg by mouth as needed.     triamcinolone cream (KENALOG) 0.1 % Apply 1 Application topically 2 (two) times daily. 80 g 3   triamterene-hydrochlorothiazide (MAXZIDE) 75-50 MG tablet Take 1 tablet by mouth daily.     No  current facility-administered medications on file prior to visit.    ALLERGIES: Allergies  Allergen Reactions   Decadron [Dexamethasone] Other (See Comments)    Shaky    Prilosec [Omeprazole]     Chest pains   Simvastatin Other (See Comments)    Weakness, mind fog, and back pain    Vitamin D Analogs Other (See Comments)    Constipation with OTC products, Per records from [facility]    FAMILY HISTORY: Family History  Problem Relation Age of Onset   Hypotension Mother    High blood pressure Mother        Per [facility] New Patient Packet   High Cholesterol Mother        Per [facility] New Patient Packet   Arthritis Mother        Per  [facility] New Patient Packet   High blood pressure Brother        Per [facility] New Patient Packet   High Cholesterol Brother        Per [facility] New Patient Packet   Cancer Maternal Aunt        Per [facility] New Patient Packet   Diabetes Maternal Aunt    Diabetes Maternal Uncle        Per [facility] New Patient Packet   Breast cancer Maternal Grandmother    Diabetes Maternal Grandmother    Stroke Maternal Grandmother        Per [facility] New Patient Packet   Arthritis Maternal Grandmother        Per [facility] New Patient Packet   High blood pressure Son        Per [facility] New Patient Packet   High Cholesterol Son        Per [facility] New Patient Packet   Thyroid disease Son        Per [facility] New Patient Packet   Mood Disorder Son        Per [facility] New Patient Packet   Autism Son        Per [facility] New Patient Packet   Migraines Neg Hx       Objective:  *** General: No acute distress.  Patient appears ***-groomed.   Head:  Normocephalic/atraumatic Eyes:  Fundi examined but not visualized Neck: supple, no paraspinal tenderness, full range of motion Heart:  Regular rate and rhythm Lungs:  Clear to auscultation bilaterally Back: No paraspinal tenderness Neurological Exam: alert and oriented.  Speech fluent and not dysarthric, language intact.  CN II-XII intact. Bulk and tone normal, muscle  strength 5/5 throughout.  Sensation to light touch intact.  Deep tendon reflexes 2+ throughout, toes downgoing.  Finger to nose testing intact.  Gait normal, Romberg negative.   Shon Millet, DO  CC: ***

## 2023-09-13 ENCOUNTER — Ambulatory Visit (INDEPENDENT_AMBULATORY_CARE_PROVIDER_SITE_OTHER): Payer: Self-pay | Admitting: Neurology

## 2023-09-13 ENCOUNTER — Encounter: Payer: Self-pay | Admitting: Neurology

## 2023-09-13 VITALS — BP 117/72 | HR 58 | Ht 70.0 in | Wt 216.0 lb

## 2023-09-13 DIAGNOSIS — G44219 Episodic tension-type headache, not intractable: Secondary | ICD-10-CM

## 2023-11-20 ENCOUNTER — Ambulatory Visit: Payer: Medicaid Other | Admitting: Nurse Practitioner

## 2023-11-28 ENCOUNTER — Ambulatory Visit: Admitting: Neurology

## 2024-01-19 ENCOUNTER — Telehealth: Payer: Self-pay | Admitting: Cardiology

## 2024-01-19 NOTE — Telephone Encounter (Signed)
 Patient c/o Palpitations: STAT if patient c/o lightheadedness, shortness of breath, or chest pain  How long have you had palpitations/irregular HR/ Afib? Are you having the symptoms now? Just for a day   Are you currently experiencing lightheadedness, SOB or CP? No   Do you have a history of afib (atrial fibrillation) or irregular heart rhythm? No   Have you checked your BP or HR? (document readings if available): N/A  Are you experiencing any other symptoms? No

## 2024-01-19 NOTE — Telephone Encounter (Signed)
 Left message to call back.

## 2024-01-23 NOTE — Progress Notes (Unsigned)
 NEUROLOGY FOLLOW UP OFFICE NOTE  Teresa Kent [id number]  Assessment/Plan:   Right palm pain Subjective bilateral leg weakness  I do not suspect a primary neurological etiology for her symptoms.  Her neurologic exam is unremarkable.  She has no evidence of myelopathy, neuropathy, or objective weakness in her extremities.  She reportedly had MRI of lumbar spine that was unremarkable.  Will have patient sign release to request copy of MRI report faxed to us .  The symptoms involving her right palm is not consistent with cervical radiculopathy, carpal tunnel syndrome or other upper extremity mononeuropathy.  Unclear etiology but consider an underlying cyst?  In absence of any abnormal neurologic findings on exam, no further neurologic workup is warranted.  Regarding the right palm pain, consider referral to a hand specialist but I defer to her PCP to determine if that is appropriate.    Total time spent on today's visit was 32 minutes dedicated to this patient today, preparing to see patient, examining the patient, ordering tests and/or medications and counseling the patient, documenting clinical information in the EHR or other health record, independently interpreting results and communicating results to the patient/family, discussing treatment and goals, answering patient's questions and coordinating care.    Subjective:  Teresa Kent is a 58 year old right-handed female with HTN and hypothyroidism whom I previously saw for headache presents today for right hand numbness and left leg weakness.    She reports experiencing diffuse muscle spasms for at least a year.  Beginning about a month ago, she developed a burning pain in a small circumscribed area in the middle of her right palm.  No radiating or involvement in the fingers.  No radicular pain or weakness in the right upper extremity.  Occurs anytime and not with position or action.  She has history of neck pain.  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 She was followed by Dr. Carollee at [facility] for her neck pain, treated with dry needling and PT. She also has been experiencing heaviness in both legs, most noticeable in her right leg.  She has some non-radiating back pain but no pain or numbness in the legs.  Her orthopedist at [facility] reportedly checked a lumbar MRI which was unremarkable and was told to follow up with neurology.    PAST MEDICAL HISTORY: Past Medical History:  Diagnosis Date   Abnormal Pap smear 02/21/2003   Acne    Per records from [facility]   Acute sinusitis    Per Records from [facility]   Anemia    heavy menses   Arthritis    Per [facility] New Patient Packet   Cervicogenic headache    Per [facility] New Patient Packet   Colon cancer screening 03/10/2022   Per Records from [facility]   COVID    Per Records from [facility]   Elevated sed rate    Per records from [facility]   Elevated sed rate    Per Records from [facility]   Endometriosis 02/21/2004   Fever blister    Per records from [facility]   Fibroids, submucosal 02/21/2004   GERD (gastroesophageal reflux disease)    Per Records from [facility]   Goiter    Per records from [facility]  H/O: menorrhagia 02/21/2004   High cholesterol    Per [facility] New Patient Packet   Hx of mammogram 2024   Per [facility] New Patient Packet   Hypertension    Hypothyroidism    Idiopathic intracranial hypertension    Per Records from [facility]   Increased BMI 10/11/2010   Lower extremity edema    Per Records from [facility]   Morbid obesity [facility])    Per records from [facility]   Ovarian cyst, right 03/19/2004   Papanicolaou smear  09/2021   Per Records from [facility]   Postprocedural hypothyroidism    Per Records from [facility]   Simple endometrial hyperplasia 02/21/2004   Vitamin D  deficiency    Per records from [facility]    MEDICATIONS: Current Outpatient Medications on File Prior to Visit  Medication Sig Dispense Refill   acetaminophen  (TYLENOL ) 650 MG CR tablet Take 650 mg by mouth as needed for pain.     albuterol  (VENTOLIN  HFA) 108 (90 Base) MCG/ACT inhaler Inhale 1-2 puffs into the lungs every 6 (six) hours as needed. (Patient not taking: Reported on 09/13/2023) 8 g 2   EPINEPHrine  0.3 mg/0.3 mL IJ SOAJ injection Inject 0.3 mg into the muscle as needed for anaphylaxis. (Patient not taking: Reported on 09/13/2023) 1 each 0   levothyroxine (SYNTHROID) 150 MCG tablet Take 150 mcg by mouth daily before breakfast.     loratadine (CLARITIN) 10 MG tablet Take 10 mg by mouth 3 (three) times a week.     meloxicam (MOBIC) 7.5 MG tablet Take 7.5 mg by mouth as needed.     tiZANidine  (ZANAFLEX ) 4 MG tablet Take 4 mg by mouth as needed.     triamcinolone  cream (KENALOG ) 0.1 % Apply 1 Application topically 2 (two) times daily. 80 g 3   triamterene-hydrochlorothiazide (MAXZIDE) 75-50 MG tablet Take 1 tablet by mouth daily.     No current facility-administered medications on file prior to visit.    ALLERGIES: Allergies  Allergen Reactions   Decadron  [Dexamethasone ] Other (See Comments)    Shaky    Prilosec [Omeprazole ]     Chest pains   Simvastatin Other (See Comments)    Weakness, mind fog, and back pain    Vitamin D  Analogs Other (See Comments)    Constipation with OTC products, Per records from [facility]    FAMILY HISTORY: Family History  Problem Relation Age of Onset   Hypotension Mother    High blood pressure Mother        Per [facility] New Patient Packet   High Cholesterol Mother        Per [facility] New Patient Packet   Arthritis Mother        Per [facility] New Patient Packet   High blood  pressure Brother        Per [facility] New Patient Packet   High Cholesterol Brother        Per [facility] New Patient Packet   Cancer Maternal Aunt        Per [facility] New Patient Packet   Diabetes Maternal Aunt    Diabetes Maternal Uncle        Per [facility] New Patient Packet   Breast cancer Maternal Grandmother    Diabetes Maternal Grandmother    Stroke Maternal Grandmother        Per [facility] New Patient Packet   Arthritis Maternal Grandmother        Per [facility] New Patient Packet   High blood pressure  Son        Per [facility] New Patient Packet   High Cholesterol Son        Per [facility] New Patient Packet   Thyroid  disease Son        Per [facility] New Patient Packet   Mood Disorder Son        Per [facility] New Patient Packet   Autism Son        Per [facility] New Patient Packet   Migraines Neg Hx       Objective:  Blood pressure 111/75, pulse (!) 59, height 5' 10 (1.778 m), weight 213 lb 3.2 oz (96.7 kg), last menstrual period 10/31/2019. General: No acute distress.  Patient appears well-groomed.   Head:  Normocephalic/atraumatic Eyes:  Fundi examined but not visualized Neck: supple, no paraspinal tenderness, full range of motion Heart:  Regular rate and rhythm Neurological Exam: alert and oriented.  Speech fluent and not dysarthric, language intact.  CN II-XII intact. Bulk and tone normal, muscle strength 5/5 throughout.  Sensation to pinprick and vibration intact.  Deep tendon reflexes 2+ throughout, toes downgoing.  Finger to nose testing intact.  Gait normal, able to turn, walk on toes, heals and in tandem.  Romberg negative.   Juliene Dunnings, DO  CC: Harlene An, NP

## 2024-01-24 ENCOUNTER — Encounter: Payer: Self-pay | Admitting: Neurology

## 2024-01-24 ENCOUNTER — Ambulatory Visit (INDEPENDENT_AMBULATORY_CARE_PROVIDER_SITE_OTHER): Payer: Self-pay | Admitting: Neurology

## 2024-01-24 VITALS — BP 111/75 | HR 59 | Ht 70.0 in | Wt 213.2 lb

## 2024-01-24 DIAGNOSIS — M79641 Pain in right hand: Secondary | ICD-10-CM

## 2024-01-24 DIAGNOSIS — R29898 Other symptoms and signs involving the musculoskeletal system: Secondary | ICD-10-CM

## 2024-01-24 NOTE — Patient Instructions (Signed)
 I do not suspect that your symptoms are due to a neurologic condition.  Muscle strength seems intact. Regarding pain in the right palm, you can ask your PCP whether evaluation by hand specialist is warranted.  It would not be due to increased intracranial pressure.

## 2024-01-27 ENCOUNTER — Encounter: Payer: Self-pay | Admitting: Neurology

## 2024-01-27 DIAGNOSIS — G932 Benign intracranial hypertension: Secondary | ICD-10-CM

## 2024-01-27 NOTE — Progress Notes (Deleted)
  Cardiology Office Note   Date:  01/27/2024  ID:  Teresa Kent, DOB [birth year], MRN [id number] PCP: Caro Harlene POUR, NP  [facility] Providers Cardiologist:  Dub Huntsman, DO    History of Present Illness Teresa Kent is a 59 y.o. female with a past medical history of HTN, HLD, endometriosis, hypothyroidism. Patient followed by Dr. Derotha and presents for evaluation of palpitations   Patient previously had coronary CTA 04/2022 that showed a coronary calcium score of 0, no evidence of CAD. Echocardiogram 06/2022 showed EF 60-65%, no regional wall motion abnormalities, normal LV diastolic parameters, normal RV systolic function.   Patient called the office 01/19/24 with palpitations. Was scheduled for an appointment   Palpitations   HTN     ROS: ***  Studies Reviewed      *** Risk Assessment/Calculations {Does this patient have ATRIAL FIBRILLATION?:[phone]} No BP recorded.  {Refresh Note OR Click here to enter BP  :1}***       Physical Exam VS:  LMP 10/31/2019        Wt Readings from Last 3 Encounters:  01/24/24 213 lb 3.2 oz (96.7 kg)  09/13/23 216 lb (98 kg)  05/15/23 205 lb (93 kg)    GEN: Well nourished, well developed in no acute distress NECK: No JVD; No carotid bruits CARDIAC: ***RRR, no murmurs, rubs, gallops RESPIRATORY:  Clear to auscultation without rales, wheezing or rhonchi  ABDOMEN: Soft, non-tender, non-distended EXTREMITIES:  No edema; No deformity   ASSESSMENT AND PLAN ***    {Are you ordering a CV Procedure (e.g. stress test, cath, DCCV, TEE, etc)?   Press F2        :[id number]}  Dispo: ***  Signed, Rollo FABIENE Louder, PA-C

## 2024-02-03 ENCOUNTER — Telehealth: Payer: Self-pay | Admitting: Nurse Practitioner

## 2024-02-03 DIAGNOSIS — R079 Chest pain, unspecified: Secondary | ICD-10-CM

## 2024-02-03 NOTE — Progress Notes (Signed)
 Virtual Visit Consent   Teresa Kent, you are scheduled for a virtual visit with a [facility] provider today. Just as with appointments in the office, your consent must be obtained to participate. Your consent will be active for this visit and any virtual visit you may have with one of our providers in the next 365 days. If you have a MyChart account, a copy of this consent can be sent to you electronically.  As this is a virtual visit, video technology does not allow for your provider to perform a traditional examination. This may limit your provider's ability to fully assess your condition. If your provider identifies any concerns that need to be evaluated in person or the need to arrange testing (such as labs, EKG, etc.), we will make arrangements to do so. Although advances in technology are sophisticated, we cannot ensure that it will always work on either your end or our end. If the connection with a video visit is poor, the visit may have to be switched to a telephone visit. With either a video or telephone visit, we are not always able to ensure that we have a secure connection.  By engaging in this virtual visit, you consent to the provision of healthcare and authorize for your insurance to be billed (if applicable) for the services provided during this visit. Depending on your insurance coverage, you may receive a charge related to this service.  I need to obtain your verbal consent now. Are you willing to proceed with your visit today? [name] has provided verbal consent on 02/03/2024 for a virtual visit (video or telephone). Haze LELON Servant, NP  Date: 02/03/2024 12:59 PM   Virtual Visit via Video Note   I, Haze LELON Servant, connected with  Teresa Kent  ([id number], 09/14/1965) on 02/03/24 at 12:45 PM EDT by a video-enabled telemedicine application and verified that I am speaking with the correct person using two identifiers.  Location: Patient: Virtual Visit  Location Patient: Home Provider: Virtual Visit Location Provider: Home Office   I discussed the limitations of evaluation and management by telemedicine and the availability of in person appointments. The patient expressed understanding and agreed to proceed.    History of Present Illness: Teresa Kent is a 58 y.o. who identifies as a female who was assigned female at birth, and is being seen today for possible medication reaction .   Ms. Mccowan was recently started on amoxicllin 4 days ago. 2 days after starting amoxicillin she began to experience symptoms of diaphoresis, shortness of breath, chest tightness, severe headache pain/popping in the top of her head. Her blood pressure is normal based on her home monitoring device. She has stopped taking amoxicillin and states symptoms are not as severe but have not completely resolved.   BP Readings from Last 3 Encounters:  01/24/24 111/75  09/13/23 117/72  05/15/23 118/78     Problems:  Patient Active Problem List   Diagnosis Date Noted   Asthma 03/14/2023   History of thyroid  disorder 01/31/2023   Dyspnea 12/12/2022   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   DDD (degenerative disc disease), cervical 11/15/2022   Nasal obstruction 09/15/2022   Osteoarthritis of cervical spine 07/05/2022   Cervicogenic headache 07/05/2022   Chronic intractable headache 06/07/2022   Cervical stenosis of spinal canal 06/07/2022   Hydrosalpinx 06/02/2022   Hypertensive disorder 06/02/2022   Hypothyroidism 06/02/2022   Menorrhagia 06/02/2022   Anemia 06/02/2022   Abnormal cervical Papanicolaou smear 06/02/2022  Obesity with body mass index 30 or greater 06/02/2022   Pruritic rash 06/02/2022   Uterine leiomyoma 06/02/2022   Vitamin D  deficiency 06/02/2022   IIH (idiopathic intracranial hypertension) 04/19/2022   Low back pain 10/16/2021   Thoracic back pain 10/16/2021   Hirsutism 12/08/2016   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   Alopecia  areata 12/08/2016   Acne vulgaris 12/08/2016   Seborrheic dermatitis of scalp 12/08/2016   Endometriosis 11/03/2015   Hidradenitis suppurativa 12/19/2011   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    Allergies:  Allergies  Allergen Reactions   Decadron  [Dexamethasone ] Other (See Comments)    Shaky    Prilosec [Omeprazole ]     Chest pains   Simvastatin Other (See Comments)    Weakness, mind fog, and back pain    Vitamin D  Analogs Other (See Comments)    Constipation with OTC products, Per records from [facility]   Medications:  Current Outpatient Medications:    acetaminophen  (TYLENOL ) 650 MG CR tablet, Take 650 mg by mouth as needed for pain., Disp: , Rfl:    albuterol  (VENTOLIN  HFA) 108 (90 Base) MCG/ACT inhaler, Inhale 1-2 puffs into the lungs every 6 (six) hours as needed., Disp: 8 g, Rfl: 2   EPINEPHrine  0.3 mg/0.3 mL IJ SOAJ injection, Inject 0.3 mg into the muscle as needed for anaphylaxis., Disp: 1 each, Rfl: 0   levothyroxine (SYNTHROID) 150 MCG tablet, Take 150 mcg by mouth daily before breakfast., Disp: , Rfl:    loratadine (CLARITIN) 10 MG tablet, Take 10 mg by mouth 3 (three) times a week. (Patient taking differently: Take 10 mg by mouth daily as needed.), Disp: , Rfl:    meloxicam (MOBIC) 7.5 MG tablet, Take 7.5 mg by mouth as needed., Disp: , Rfl:    tiZANidine  (ZANAFLEX ) 4 MG tablet, Take 4 mg by mouth as needed., Disp: , Rfl:    triamcinolone  cream (KENALOG ) 0.1 %, Apply 1 Application topically 2 (two) times daily. (Patient taking differently: Apply 1 Application topically as needed.), Disp: 80 g, Rfl: 3   triamterene-hydrochlorothiazide (MAXZIDE) 75-50 MG tablet, Take 1 tablet by mouth daily., Disp: , Rfl:   Observations/Objective: Patient is well-developed, well-nourished in no acute distress.  Resting comfortably at home.  Head is normocephalic, atraumatic.  No labored breathing. Speech is clear and coherent with logical content.  Patient is alert  and oriented at baseline.    Assessment and Plan: 1. Left-sided chest pain (Primary)  Instructed to go to emergency room if no improvement in a few hours or if symptoms worsen  Follow Up Instructions: I discussed the assessment and treatment plan with the patient. The patient was provided an opportunity to ask questions and all were answered. The patient agreed with the plan and demonstrated an understanding of the instructions.  A copy of instructions were sent to the patient via MyChart unless otherwise noted below.    The patient was advised to call back or seek an in-person evaluation if the symptoms worsen or if the condition fails to improve as anticipated.    Kathy Wahid W Ivy Meriwether, NP

## 2024-02-03 NOTE — Patient Instructions (Signed)
  Cloey E Melander, thank you for joining Haze LELON Servant, NP for today's virtual visit.  While this provider is not your primary care provider (PCP), if your PCP is located in our provider database this encounter information will be shared with them immediately following your visit.   A [facility] MyChart account gives you access to today's visit and all your visits, tests, and labs performed at [facility]  click here if you don't have a [facility] MyChart account or go to [email or web address]  Consent: (Patient) Teresa Kent provided verbal consent for this virtual visit at the beginning of the encounter.  Current Medications:  Current Outpatient Medications:    acetaminophen  (TYLENOL ) 650 MG CR tablet, Take 650 mg by mouth as needed for pain., Disp: , Rfl:    albuterol  (VENTOLIN  HFA) 108 (90 Base) MCG/ACT inhaler, Inhale 1-2 puffs into the lungs every 6 (six) hours as needed., Disp: 8 g, Rfl: 2   EPINEPHrine  0.3 mg/0.3 mL IJ SOAJ injection, Inject 0.3 mg into the muscle as needed for anaphylaxis., Disp: 1 each, Rfl: 0   levothyroxine (SYNTHROID) 150 MCG tablet, Take 150 mcg by mouth daily before breakfast., Disp: , Rfl:    loratadine (CLARITIN) 10 MG tablet, Take 10 mg by mouth 3 (three) times a week. (Patient taking differently: Take 10 mg by mouth daily as needed.), Disp: , Rfl:    meloxicam (MOBIC) 7.5 MG tablet, Take 7.5 mg by mouth as needed., Disp: , Rfl:    tiZANidine  (ZANAFLEX ) 4 MG tablet, Take 4 mg by mouth as needed., Disp: , Rfl:    triamcinolone  cream (KENALOG ) 0.1 %, Apply 1 Application topically 2 (two) times daily. (Patient taking differently: Apply 1 Application topically as needed.), Disp: 80 g, Rfl: 3   triamterene-hydrochlorothiazide (MAXZIDE) 75-50 MG tablet, Take 1 tablet by mouth daily., Disp: , Rfl:    Medications ordered in this encounter:  No orders of the defined types were placed in this encounter.    *If you need refills on other  medications prior to your next appointment, please contact your pharmacy*  Follow-Up: Call back or seek an in-person evaluation if the symptoms worsen or if the condition fails to improve as anticipated.  [facility] [phone]  Other Instructions Instructed to go to emergency room if no improvement in a few hours or if symptoms worsen   If you have been instructed to have an in-person evaluation today at a local Urgent Care facility, please use the link below. It will take you to a list of all of our available [facility], including address, phone number and hours of operation. Please do not delay care.  [facility]  If you or a family member do not have a primary care provider, use the link below to schedule a visit and establish care. When you choose a [facility] primary care physician or advanced practice provider, you gain a long-term partner in health. Find a Primary Care Provider  Learn more about [facility] in-office and virtual care options:  - Get Care Now

## 2024-02-08 ENCOUNTER — Ambulatory Visit: Payer: Self-pay | Admitting: Cardiology

## 2024-02-12 ENCOUNTER — Other Ambulatory Visit (HOSPITAL_COMMUNITY): Payer: Self-pay | Admitting: Nurse Practitioner

## 2024-02-12 DIAGNOSIS — R519 Headache, unspecified: Secondary | ICD-10-CM

## 2024-02-12 DIAGNOSIS — G932 Benign intracranial hypertension: Secondary | ICD-10-CM

## 2024-02-13 ENCOUNTER — Ambulatory Visit (HOSPITAL_COMMUNITY)
Admission: RE | Admit: 2024-02-13 | Discharge: 2024-02-13 | Disposition: A | Discharge status: Home / Self Care | Payer: Self-pay | Source: Ambulatory Visit | Attending: Nurse Practitioner | Admitting: Nurse Practitioner

## 2024-02-13 DIAGNOSIS — R519 Headache, unspecified: Secondary | ICD-10-CM | POA: Insufficient documentation

## 2024-02-13 DIAGNOSIS — G932 Benign intracranial hypertension: Secondary | ICD-10-CM | POA: Insufficient documentation

## 2024-03-06 NOTE — Progress Notes (Signed)
 Submitted Prior Authorization via Fax to [facility] for Lumbar Puncture. CPT N1600096, [id number]. Pending determination.

## 2024-03-12 ENCOUNTER — Telehealth: Payer: Self-pay

## 2024-03-12 NOTE — Telephone Encounter (Signed)
 Called patient and left a message for a call back. When patient returns call needs to inform per below:   We have attempted to submit prior authorization for your Lumbar Puncture. However, your insurance has informed us  that they are unable to authorize the Lumbar Puncture due to member not eligible at the time services are being requested. So we are wanting to know if your insurance going to end soon?

## 2024-03-13 NOTE — Telephone Encounter (Signed)
 Called patient and left a message for a call back.

## 2024-03-14 NOTE — Telephone Encounter (Signed)
 Called patient and left a message for a call back.

## 2024-03-15 NOTE — Telephone Encounter (Signed)
 Called patient and left a message per DPR that we need to touch base with her in regards to her insurance and Lumbar Puncture. Left contact number so patient may call us  back.

## 2024-03-22 ENCOUNTER — Ambulatory Visit: Admitting: Neurology

## 2024-05-29 NOTE — Progress Notes (Signed)
 " [facility] - Clinic Note  05/30/2024     CHIEF COMPLAINT Patient presents for Retina Evaluation   HISTORY OF PRESENT ILLNESS: Teresa Kent is a 59 y.o. female who presents to the clinic today for:   HPI     Retina Evaluation   In left eye.  This started 3 months ago.  Associated Symptoms Flashes.  I, the attending physician,  performed the HPI with the patient and updated documentation appropriately.        Comments   Pt is here for a retinal tear in the left eye. Dr.Dunnington referred her. Pt states symptoms started about 3 months ago- curtain OS and flashes of light that would come and go. Pt c/o headaches/pain OS that comes and goes. Pt uses Systane PRN OU.      Last edited by Valdemar Rogue, MD on 06/09/2024  2:51 PM.     Pt states 3-4 months ago seeing FOL in OS and having a 'black veil' when she closes her eyes. Feeling pressure feelings in her head.   Referring physician: Caro Harlene POUR, NP [address] [place],  [place] [address]  HISTORICAL INFORMATION:   Selected notes from the MEDICAL RECORD NUMBER ED f/u for IIH LEE:  Ocular Hx- PMH-    CURRENT MEDICATIONS: No current outpatient medications on file. (Ophthalmic Drugs)   No current facility-administered medications for this visit. (Ophthalmic Drugs)   Current Outpatient Medications (Other)  Medication Sig   acetaminophen  (TYLENOL ) 650 MG CR tablet Take 650 mg by mouth as needed for pain.   albuterol  (VENTOLIN  HFA) 108 (90 Base) MCG/ACT inhaler Inhale 1-2 puffs into the lungs every 6 (six) hours as needed.   EPINEPHrine  0.3 mg/0.3 mL IJ SOAJ injection Inject 0.3 mg into the muscle as needed for anaphylaxis.   levothyroxine (SYNTHROID) 150 MCG tablet Take 150 mcg by mouth daily before breakfast.   loratadine (CLARITIN) 10 MG tablet Take 10 mg by mouth 3 (three) times a week. (Patient taking differently: Take 10 mg by mouth daily as needed.)   meloxicam (MOBIC) 7.5 MG tablet  Take 7.5 mg by mouth as needed.   tiZANidine  (ZANAFLEX ) 4 MG tablet Take 4 mg by mouth as needed.   triamcinolone  cream (KENALOG ) 0.1 % Apply 1 Application topically 2 (two) times daily. (Patient taking differently: Apply 1 Application topically as needed.)   triamterene-hydrochlorothiazide (MAXZIDE) 75-50 MG tablet Take 1 tablet by mouth daily.   No current facility-administered medications for this visit. (Other)   REVIEW OF SYSTEMS:   ALLERGIES Allergies  Allergen Reactions   Decadron  [Dexamethasone ] Other (See Comments)    Shaky    Prilosec [Omeprazole ]     Chest pains   Simvastatin Other (See Comments)    Weakness, mind fog, and back pain    Vitamin D  Analogs Other (See Comments)    Constipation with OTC products, Per records from [facility]   PAST MEDICAL HISTORY Past Medical History:  Diagnosis Date   Abnormal Pap smear 02/21/2003   Acne    Per records from [facility]   Acute sinusitis    Per Records from [facility]   Anemia    heavy menses   Arthritis    Per [facility] New Patient Packet   Cervicogenic headache    Per [facility] New Patient Packet   Colon cancer screening 03/10/2022   Per Records from [facility]   COVID    Per Records from [facility]   Elevated  sed rate    Per records from [facility]   Elevated sed rate    Per Records from [facility]   Endometriosis 02/21/2004   Fever blister    Per records from [facility]   Fibroids, submucosal 02/21/2004   GERD (gastroesophageal reflux disease)    Per Records from [facility]   Goiter    Per records from [facility]   H/O: menorrhagia 02/21/2004   High cholesterol    Per [facility] New Patient Packet   Hx of mammogram 2024   Per [facility] New Patient Packet   Hypertension    Hypothyroidism    Idiopathic intracranial hypertension    Per Records from [facility]   Increased BMI 10/11/2010   Lower extremity edema    Per Records from [facility]   Morbid  obesity [facility])    Per records from [facility]   Ovarian cyst, right 03/19/2004   Papanicolaou smear 09/2021   Per Records from [facility]   Postprocedural hypothyroidism    Per Records from [facility]   Simple endometrial hyperplasia 02/21/2004   Vitamin D  deficiency    Per records from [facility]   Past Surgical History:  Procedure Laterality Date   DILATION AND CURETTAGE OF UTERUS  03/19/2004   DILITATION & CURRETTAGE/HYSTROSCOPY WITH NOVASURE ABLATION N/A 12/20/2012   Procedure: DILATATION & CURETTAGE/HYSTEROSCOPY WITH NOVASURE ABLATION;  Surgeon: Jon CINDERELLA Rummer, MD;  Location: [facility];  Service: Gynecology;  Laterality: N/A;   OVARY SURGERY     resection of fibroid  03/19/2004   THYROID  SURGERY     TUBAL LIGATION     FAMILY HISTORY Family History  Problem Relation Age of Onset   Hypotension Mother    High blood pressure Mother        Per [facility] New Patient Packet   High Cholesterol Mother        Per [facility] New Patient Packet   Arthritis Mother        Per [facility] New Patient Packet   High blood pressure Brother        Per [facility] New Patient Packet   High Cholesterol Brother        Per [facility] New Patient Packet   Cancer Maternal Aunt        Per [facility] New Patient Packet   Diabetes Maternal Aunt    Diabetes Maternal Uncle        Per [facility] New Patient Packet   Breast cancer Maternal Grandmother    Diabetes Maternal Grandmother    Stroke Maternal Grandmother        Per [facility] New Patient Packet   Arthritis Maternal Grandmother        Per [facility] New Patient Packet   High blood pressure Son        Per [facility] New Patient Packet   High Cholesterol Son        Per [facility] New Patient Packet   Thyroid  disease Son        Per [facility] New Patient Packet   Mood Disorder Son        Per [facility] New Patient Packet   Autism Son        Per [facility] New Patient Packet   Migraines Neg Hx    SOCIAL HISTORY Social History   Tobacco Use   Smoking status: Never    Passive exposure: Never   Smokeless  tobacco: Never  Vaping Use   Vaping status: Never Used  Substance Use Topics  Alcohol use: No   Drug use: No       OPHTHALMIC EXAM:  Base Eye Exam     Visual Acuity (Snellen - Linear)       Right Left   Dist cc 20/20 -1 20/20    Correction: Glasses         Tonometry (Tonopen, 8:33 AM)       Right Left   Pressure 19 18         Pupils       Pupils Dark Light Shape React APD   Right PERRL 4 3 Round Brisk None   Left PERRL 4 3 Round Brisk None         Visual Fields       Left Right    Full Full         Extraocular Movement       Right Left    Full, Ortho Full, Ortho         Neuro/Psych     Oriented x3: Yes         Dilation     Both eyes: 1.0% Mydriacyl, 2.5% Phenylephrine @ 8:34 AM           Slit Lamp and Fundus Exam     Slit Lamp Exam       Right Left   Lids/Lashes Dermatochalasis - upper lid Dermatochalasis - upper lid   Conjunctiva/Sclera mild melanosis mild melanosis   Cornea arcus, tear film debris arcus, trace PEE   Anterior Chamber deep and clear deep and clear   Iris Round and dilated Round and dilated   Lens 2+ Nuclear sclerosis, 2+ Cortical cataract 2+ Nuclear sclerosis, 2+ Cortical cataract   Anterior Vitreous Vitreous syneresis Vitreous syneresis         Fundus Exam       Right Left   Disc Pink and Sharp, no edema Pink and Sharp, no edema   C/D Ratio 0.3 0.3   Macula Flat, Good foveal reflex, RPE mottling, No heme or edema Flat, Good foveal reflex, RPE mottling, No heme or edema   Vessels Normal Normal   Periphery Attached, No heme Attached, No heme           Refraction     Wearing Rx       Sphere Cylinder Axis Add   Right -4.00 +0.25 104 +2.25   Left -3.00 +0.25 061 +2.25    Age: 20           IMAGING AND PROCEDURES  Imaging and Procedures for 05/30/2024  OCT, Retina - OU - Both Eyes       Right Eye Quality was good. Central Foveal Thickness: 257. Progression has been stable. Findings  include normal foveal contour, no IRF, no SRF, vitreomacular adhesion (Stable improvement in disc edema).   Left Eye Quality was good. Central Foveal Thickness: 251. Progression has been stable. Findings include normal foveal contour, no IRF, no SRF, vitreomacular adhesion (Stable improvement in disc edema. ).   Notes *Images captured and stored on drive  Diagnosis / Impression:  NFP; no IRF/SRF OU No disc edema OU  Clinical management:  See below  Abbreviations: NFP - Normal foveal profile. CME - cystoid macular edema. PED - pigment epithelial detachment. IRF - intraretinal fluid. SRF - subretinal fluid. EZ - ellipsoid zone. ERM - epiretinal membrane. ORA - outer retinal atrophy. ORT - outer retinal tubulation. SRHM - subretinal hyper-reflective material. IRHM - intraretinal hyper-reflective material  ASSESSMENT/PLAN:    ICD-10-CM   1. Visual disturbance  H53.9     2. IIH (idiopathic intracranial hypertension)  G93.2 OCT, Retina - OU - Both Eyes    3. Combined forms of age-related cataract of both eyes  H25.813     4. Essential hypertension  I10     5. Hypertensive retinopathy of both eyes  H35.033      1. Visual distrubance - Pt presents today for 'black veil' over OS when she closes her eyes. Had FOL 3-4 months ago, saw Dr. Delories at [facility], was told she had a tear. - dxam showed no RT/RD - f/u here PRN   2. H/o Idiopathic Intracranial hypertension   - pt with 1+ mo history of headaches, blurred vision  - had MRI (ordered by PCP) on 11.18.23 -- shows empty sella and prominence of CSF concerning for IIH  - presented to ED on 11.21.23 for severe headache, where CTA and CT venogram showed concern for venous sinus thrombosis and other findings concerning for IIH again  - pt was then referred to Neurology and Ophthalmology for outpt f/u  - evaluated by Neurology on 11.28.23 -- LP and MRV ordered  - pt was prescribed diamox  by PCP, but did not  start until after her ED visit on 11.21.23  - on exam here, pt reports positional headaches; +pulsatile tinnitus; no transient visual obscurations / vision loss; - dilated exam here shows good vision (20/20 OD, 20/20 OS) and no optic disc edema OU - pt was on po prednisone --not currently on.  - agree with Neurology management with further imaging and LP - no retinal or ophthalmic interventions indicated or recommended as vision and eye exam essentially normal - advised weight loss and continuation of acetazolamide  - pt can f/u here prn  3. Mixed Cataract OU - The symptoms of cataract, surgical options, and treatments and risks were discussed with patient. - discussed diagnosis and progression - monitor  4,5. Hypertensive retinopathy OU - discussed importance of tight BP control - monitor   Ophthalmic Meds Ordered this visit:  No orders of the defined types were placed in this encounter.    Return if symptoms worsen or fail to improve.  There are no Patient Instructions on file for this visit.   Explained the diagnoses, plan, and follow up with the patient and they expressed understanding.  Patient expressed understanding of the importance of proper follow up care.   This document serves as a record of services personally performed by Redell JUDITHANN Hans, MD, PhD. It was created on their behalf by Almetta Pesa, an ophthalmic technician. The creation of this record is the provider's dictation and/or activities during the visit.    Electronically signed by: Almetta Pesa, OA, 06/09/24  2:51 PM  Redell JUDITHANN Hans, M.D., Ph.D. Diseases & Surgery of the Retina and Vitreous [facility]  I have reviewed the above documentation for accuracy and completeness, and I agree with the above. Redell JUDITHANN Hans, M.D., Ph.D. 06/09/24 2:53 PM   Abbreviations: M myopia (nearsighted); A astigmatism; H hyperopia (farsighted); P presbyopia; Mrx spectacle prescription;  CTL  contact lenses; OD right eye; OS left eye; OU both eyes  XT exotropia; ET esotropia; PEK punctate epithelial keratitis; PEE punctate epithelial erosions; DES dry eye syndrome; MGD meibomian gland dysfunction; ATs artificial tears; PFAT's preservative free artificial tears; NSC nuclear sclerotic cataract; PSC posterior subcapsular cataract; ERM epi-retinal membrane; PVD posterior vitreous detachment; RD retinal detachment; DM  diabetes mellitus; DR diabetic retinopathy; NPDR non-proliferative diabetic retinopathy; PDR proliferative diabetic retinopathy; CSME clinically significant macular edema; DME diabetic macular edema; dbh dot blot hemorrhages; CWS cotton wool spot; POAG primary open angle glaucoma; C/D cup-to-disc ratio; HVF humphrey visual field; GVF goldmann visual field; OCT optical coherence tomography; IOP intraocular pressure; BRVO Branch retinal vein occlusion; CRVO central retinal vein occlusion; CRAO central retinal artery occlusion; BRAO branch retinal artery occlusion; RT retinal tear; SB scleral buckle; PPV pars plana vitrectomy; VH Vitreous hemorrhage; PRP panretinal laser photocoagulation; IVK intravitreal kenalog ; VMT vitreomacular traction; MH Macular hole;  NVD neovascularization of the disc; NVE neovascularization elsewhere; AREDS age related eye disease study; ARMD age related macular degeneration; POAG primary open angle glaucoma; EBMD epithelial/anterior basement membrane dystrophy; ACIOL anterior chamber intraocular lens; IOL intraocular lens; PCIOL posterior chamber intraocular lens; Phaco/IOL phacoemulsification with intraocular lens placement; PRK photorefractive keratectomy; LASIK laser assisted in situ keratomileusis; HTN hypertension; DM diabetes mellitus; COPD chronic obstructive pulmonary disease  "

## 2024-05-30 ENCOUNTER — Ambulatory Visit (INDEPENDENT_AMBULATORY_CARE_PROVIDER_SITE_OTHER): Payer: Self-pay | Admitting: Ophthalmology

## 2024-05-30 ENCOUNTER — Encounter (INDEPENDENT_AMBULATORY_CARE_PROVIDER_SITE_OTHER): Payer: Self-pay | Admitting: Ophthalmology

## 2024-05-30 DIAGNOSIS — H539 Unspecified visual disturbance: Secondary | ICD-10-CM

## 2024-05-30 DIAGNOSIS — H25813 Combined forms of age-related cataract, bilateral: Secondary | ICD-10-CM

## 2024-05-30 DIAGNOSIS — I10 Essential (primary) hypertension: Secondary | ICD-10-CM

## 2024-05-30 DIAGNOSIS — H35033 Hypertensive retinopathy, bilateral: Secondary | ICD-10-CM

## 2024-05-30 DIAGNOSIS — G932 Benign intracranial hypertension: Secondary | ICD-10-CM

## 2024-06-09 ENCOUNTER — Encounter (INDEPENDENT_AMBULATORY_CARE_PROVIDER_SITE_OTHER): Payer: Self-pay | Admitting: Ophthalmology

## 2024-06-25 ENCOUNTER — Ambulatory Visit: Payer: Self-pay | Admitting: Dermatology

## 2024-08-14 ENCOUNTER — Ambulatory Visit: Payer: Self-pay | Admitting: Dermatology
